# Patient Record
Sex: Female | Born: 1945 | Race: White
Health system: Northeastern US, Academic
[De-identification: ages and names within clinical notes are randomized; demographics above are authoritative.]

## PROBLEM LIST (undated history)

## (undated) DIAGNOSIS — D249 Benign neoplasm of unspecified breast: Secondary | ICD-10-CM

## (undated) DIAGNOSIS — I739 Peripheral vascular disease, unspecified: Secondary | ICD-10-CM

## (undated) DIAGNOSIS — G20 Parkinson's disease: Secondary | ICD-10-CM

## (undated) DIAGNOSIS — F1011 Alcohol abuse, in remission: Secondary | ICD-10-CM

## (undated) DIAGNOSIS — Z95810 Presence of automatic (implantable) cardiac defibrillator: Secondary | ICD-10-CM

## (undated) DIAGNOSIS — I2510 Atherosclerotic heart disease of native coronary artery without angina pectoris: Secondary | ICD-10-CM

## (undated) HISTORY — DX: Atherosclerotic heart disease of native coronary artery without angina pectoris: I25.10

## (undated) HISTORY — DX: Parkinson's disease: G20

## (undated) HISTORY — DX: Presence of automatic (implantable) cardiac defibrillator: Z95.810

## (undated) HISTORY — DX: Peripheral vascular disease, unspecified: I73.9

## (undated) HISTORY — DX: Benign neoplasm of unspecified breast: D24.9

## (undated) HISTORY — DX: Alcohol abuse, in remission: F10.11

---

## 2011-07-14 DIAGNOSIS — I219 Acute myocardial infarction, unspecified: Secondary | ICD-10-CM

## 2011-07-14 HISTORY — DX: Acute myocardial infarction, unspecified: I21.9

## 2011-08-06 ENCOUNTER — Ambulatory Visit
Admit: 2011-08-06 | Discharge: 2011-08-06 | Disposition: A | Discharge status: Home / Self Care | Payer: Self-pay | Source: Ambulatory Visit | Attending: Geriatric Medicine | Admitting: Geriatric Medicine

## 2011-08-06 LAB — BASIC METABOLIC PANEL
Anion Gap: 5 — ABNORMAL LOW (ref 7–16)
CO2: 32 mmol/L — ABNORMAL HIGH (ref 20–28)
Calcium: 9 mg/dL (ref 8.6–10.2)
Chloride: 103 mmol/L (ref 96–108)
Creatinine: 0.64 mg/dL (ref 0.51–0.95)
GFR,Black: 107 *
GFR,Caucasian: 93 *
Glucose: 88 mg/dL (ref 60–99)
Lab: 19 mg/dL (ref 6–20)
Potassium: 4.7 mmol/L (ref 3.3–5.1)
Sodium: 140 mmol/L (ref 133–145)

## 2011-08-06 LAB — CBC AND DIFFERENTIAL
Baso # K/uL: 0 10*3/uL (ref 0.0–0.1)
Basophil %: 0.4 % (ref 0.1–1.2)
Eos # K/uL: 0.1 10*3/uL (ref 0.0–0.4)
Eosinophil %: 1.6 % (ref 0.7–5.8)
Hematocrit: 38 % (ref 34–45)
Hemoglobin: 11.9 g/dL (ref 11.2–15.7)
Lymph # K/uL: 2.5 10*3/uL (ref 1.2–3.7)
Lymphocyte %: 33.4 % (ref 19.3–51.7)
MCV: 94 fL (ref 79–95)
Mono # K/uL: 0.7 10*3/uL (ref 0.2–0.9)
Monocyte %: 9.9 % (ref 4.7–12.5)
Neut # K/uL: 4 10*3/uL (ref 1.6–6.1)
Platelets: 227 10*3/uL (ref 160–370)
RBC: 4.1 MIL/uL (ref 3.9–5.2)
RDW: 17.3 % — ABNORMAL HIGH (ref 11.7–14.4)
Seg Neut %: 54.7 % (ref 34.0–71.1)
WBC: 7.4 10*3/uL (ref 4.0–10.0)

## 2011-09-28 ENCOUNTER — Ambulatory Visit
Admit: 2011-09-28 | Discharge: 2011-09-28 | Disposition: A | Discharge status: Home / Self Care | Payer: Self-pay | Source: Ambulatory Visit | Attending: Geriatric Medicine | Admitting: Geriatric Medicine

## 2011-09-28 LAB — T4, FREE: Free T4: 1.1 ng/dL (ref 0.9–1.7)

## 2011-09-28 LAB — TSH: TSH: 1.31 u[IU]/mL (ref 0.27–4.20)

## 2011-10-23 ENCOUNTER — Encounter: Payer: Self-pay | Admitting: Physician Assistant

## 2011-10-23 DIAGNOSIS — I639 Cerebral infarction, unspecified: Secondary | ICD-10-CM | POA: Insufficient documentation

## 2011-10-23 DIAGNOSIS — I2510 Atherosclerotic heart disease of native coronary artery without angina pectoris: Secondary | ICD-10-CM | POA: Insufficient documentation

## 2011-10-23 DIAGNOSIS — F1010 Alcohol abuse, uncomplicated: Secondary | ICD-10-CM | POA: Insufficient documentation

## 2011-10-23 DIAGNOSIS — J449 Chronic obstructive pulmonary disease, unspecified: Secondary | ICD-10-CM | POA: Insufficient documentation

## 2011-10-23 DIAGNOSIS — I509 Heart failure, unspecified: Secondary | ICD-10-CM | POA: Insufficient documentation

## 2011-10-23 DIAGNOSIS — I472 Ventricular tachycardia: Secondary | ICD-10-CM | POA: Insufficient documentation

## 2011-10-23 DIAGNOSIS — I10 Essential (primary) hypertension: Secondary | ICD-10-CM

## 2011-10-23 DIAGNOSIS — D638 Anemia in other chronic diseases classified elsewhere: Secondary | ICD-10-CM | POA: Insufficient documentation

## 2011-10-23 DIAGNOSIS — I340 Nonrheumatic mitral (valve) insufficiency: Secondary | ICD-10-CM | POA: Insufficient documentation

## 2011-10-23 HISTORY — DX: Essential (primary) hypertension: I10

## 2011-10-23 HISTORY — DX: Heart failure, unspecified: I50.9

## 2011-10-23 HISTORY — DX: Ventricular tachycardia: I47.2

## 2011-10-23 HISTORY — DX: Anemia in other chronic diseases classified elsewhere: D63.8

## 2011-10-23 HISTORY — DX: Chronic obstructive pulmonary disease, unspecified: J44.9

## 2011-10-23 HISTORY — DX: Cerebral infarction, unspecified: I63.9

## 2011-10-23 HISTORY — DX: Nonrheumatic mitral (valve) insufficiency: I34.0

## 2011-10-23 HISTORY — DX: Atherosclerotic heart disease of native coronary artery without angina pectoris: I25.10

## 2011-10-25 ENCOUNTER — Non-Acute Institutional Stay: Payer: Self-pay | Admitting: Physician Assistant

## 2011-10-25 ENCOUNTER — Encounter: Payer: Self-pay | Admitting: Physician Assistant

## 2011-10-25 NOTE — Progress Notes (Signed)
Geriatrics: Patient seen 10/24/11 at 10:05 am.  [facility] Chronic Note    Patient Name: Michelle [name]   Patient DOB: [birth year]   Patient MR#: [id number]   Facility: [facility]   Unit:        Reason for Visit  Michelle [name] was seen today for follow-up of chronic conditions.    Interval History: Since her last this patient has required any acute medical interventions.  She states that she has been feeling well.  Last night however, patient did complain of a dry nonproductive cough per nursing her lungs were clear and she did request medication for the cough.  She states that the Robitussin did work quite well.  She has no longer having any cough this morning.  She denies any fevers, chills, nausea, vomiting, shortness of breath, chest pain, sputum production, or any other issues.  She also states that she has been eating and drinking well and moving her bowels regularly.  She denies any trouble with urination including dysuria or pressure.  She continues to work with physical therapy.    Past Medical History:    Medical History:  Past Medical History   Diagnosis Date   . CVA (cerebral infarction) 10/23/2011   . ASHD (arteriosclerotic heart disease) 10/23/2011   . MI (mitral incompetence) 10/23/2011   . CHF (congestive heart failure) 10/23/2011   . COPD (chronic obstructive pulmonary disease) 10/23/2011   . ETOH abuse 10/23/2011   . VT (ventricular tachycardia) 10/23/2011     S/p AICD   . Anemia of chronic disease 10/23/2011   . HTN (hypertension) 10/23/2011       Surgical History  Past Surgical History   Procedure Laterality Date   . Icd     . Hemicolectomy Left         Review of Systems:  Review of Systems  Please see above.  In addition patient denies any weight changes, abdominal pain, diarrhea, dizziness, weakness, fatigue, or any other issues.  The remainder of the review of systems was negative.    Physical Examination:  Filed Vitals:    10/24/11 0600   Temp: 35.9 C (96.7 F)   SpO2: 92%       Physical Exam   Vital signs see  above.  Gen: Patient is in no acute distress, lying in bed comfortably, pleasant, alert and oriented x3 .  Head: Normocephalic and atraumatic.  There is no tenderness over the face sinuses her scalp.  Eyes: Anicteric, there is no conjunctival irritation or erythema.  There is no drainage or discharge noted from either eye.  Mouth: Oral mucosa is moist, there are no lesions present.  Neck: Supple, no cervical lymphadenopathy, trachea midline  Chest: Chest wall is nontender.  Lungs: Clear to auscultation bilaterally.  No wheezes rales or rhonchi.  Heart: Regular rate and rhythm S1-S2 there is no murmur rub or gallop.  Abdomen nontender nondistended, positive bowel sounds throughout, no suprapubic tenderness.  Extremities: Able to move all 4 extremities, no rigidity.  She does have significant resting tremor of her right arm with ataxia of both her right arm and right leg.  There is no edema present no calf tenderness, no joint tenderness.    Allergies  No Known Allergies (drug, envir, food or latex)     Medications:  Current Outpatient Rx   Name  Route  Sig  Dispense  Refill   . potassium chloride (K-TABS,KLOR-CON) 10 MEQ CR tablet    Oral    Take 10 mEq  by mouth every other day             . amiodarone (PACERONE) 400 MG tablet    Oral    Take 400 mg by mouth daily             . aspirin 81 MG EC tablet    Oral    Take 81 mg by mouth daily             . atorvastatin (LIPITOR) 10 MG tablet    Oral    Take 10 mg by mouth daily (with dinner)             . lactulose (CHRONULAC) 20 GM/30ML solution    Oral    Take 10 g by mouth daily             . famotidine (PEPCID) 20 MG tablet    Oral    Take 20 mg by mouth daily             . furosemide (LASIX) 20 MG tablet    Oral    Take 20 mg by mouth every morning             . lisinopril (PRINIVIL,ZESTRIL) 2.5 MG tablet    Oral    Take 2.5 mg by mouth daily             . sertraline (ZOLOFT) 50 MG tablet    Oral    Take 50 mg by mouth daily             . cyanocobalamin (VITAMIN  B-12) 1000 MCG tablet    Oral    Take 1,000 mcg by mouth daily             . calcium carbonate-vitamin D 600-400 MG-UNIT per tablet    Oral    Take 1 tablet by mouth 2 times daily             . carvedilol (COREG) 12.5 MG tablet    Oral    Take 12.5 mg by mouth 2 times daily             . bisacodyl (DULCOLAX) 10 MG suppository    Rectal    Place 10 mg rectally daily as needed             . Acetaminophen (APAP) 325 MG tablet    Oral    Take 650 mg by mouth every 6 hours as needed             . sorbitol 70 % solution    Oral    Take 30 mLs by mouth daily as needed                 Psychotropic Medications    Yes. On zoloft for depression. Tolerating well. No Plan to change.    Pain Management   N/A    Latest Laboratory Results  Lab Results   Component Value Date    WBC 7.4 08/06/2011    HGB 11.9 08/06/2011    HCT 38 08/06/2011    MCV 94 08/06/2011    PLT 227 08/06/2011     Sodium   Date Value Range Status   08/06/2011 140  133 - 145 mmol/L Final        Potassium   Date Value Range Status   08/06/2011 4.7  3.3 - 5.1 mmol/L Final        Chloride   Date Value Range Status  08/06/2011 103  96 - 108 mmol/L Final        CO2   Date Value Range Status   08/06/2011 32* 20 - 28 mmol/L Final        Glucose   Date Value Range Status   08/06/2011 88  60 - 99 mg/dL Final      Reference Ranges apply only to FASTING samples.            ADA Guidelines Blood Sugar Levels for Diagnosing Diabetes & Pre-diabetes      Normal: < 100 mg/dL      Impaired Fasting Glucose (IFG): 100-125 mg/dL      Diabetes:  > 161 mg/dL on two different occasions        UN   Date Value Range Status   08/06/2011 19  6 - 20 mg/dL Final        Calcium   Date Value Range Status   08/06/2011 9.0  8.6 - 10.2 mg/dL Final        Creatinine   Date Value Range Status   08/06/2011 0.64  0.51 - 0.95 mg/dL Final       Calcium   Date Value Range Status   08/06/2011 9.0  8.6 - 10.2 mg/dL Final        Chloride   Date Value Range Status   08/06/2011 103  96 - 108 mmol/L Final        CO2   Date  Value Range Status   08/06/2011 32* 20 - 28 mmol/L Final        Creatinine   Date Value Range Status   08/06/2011 0.64  0.51 - 0.95 mg/dL Final        Glucose   Date Value Range Status   08/06/2011 88  60 - 99 mg/dL Final      Reference Ranges apply only to FASTING samples.            ADA Guidelines Blood Sugar Levels for Diagnosing Diabetes & Pre-diabetes      Normal: < 100 mg/dL      Impaired Fasting Glucose (IFG): 100-125 mg/dL      Diabetes:  > 096 mg/dL on two different occasions        Potassium   Date Value Range Status   08/06/2011 4.7  3.3 - 5.1 mmol/L Final        Sodium   Date Value Range Status   08/06/2011 140  133 - 145 mmol/L Final        UN   Date Value Range Status   08/06/2011 19  6 - 20 mg/dL Final           Goals of Care: The goals of care are: Function    Advanced Directives:     CPR Order: Attempt Cardio-Pulmonary Resuscitation      Assessment/Plan:  1.  Cough: The patient has responded well to Robitussin.  We will continue to monitor vital signs and encourage fluids.  Patient has been afebrile and currently has no further complaints.  Please notify provider if any further issues and continue when necessary Robitussin.  2.  Coronary artery disease with congestive heart failure: Patient has been doing well on her current regimen of lisinopril, carvedilol, and furosemide.  Last electrolytes were acceptable.  She also remains on aspirin 81 mg and is tolerating this well.   3.  GERD: Patient remains on famotidine and this is taking care of all of her symptoms.  We  will continue with that.  4.  History of CVA: Patient continues to have severe ataxia of her right arm with noted tremor.  She continues to participate in PT and OT and this has been continued.  5 Anemia: She did have a blood transfusion in the hospital her hematocrit was 38.  Iron supplementation was discontinued.  We will recheck a CBC in a couple of weeks to see how she is doing.    Follow-up:  Patient will be seen in about 30 days for  ongoing followup.      Provider Signature:   Suzan Nailer, PA     Date: 10/25/2011 Time:   2:16 PM

## 2011-11-06 ENCOUNTER — Ambulatory Visit
Admit: 2011-11-06 | Discharge: 2011-11-06 | Disposition: A | Discharge status: Home / Self Care | Payer: Self-pay | Source: Ambulatory Visit | Attending: Geriatric Medicine | Admitting: Geriatric Medicine

## 2011-11-07 LAB — INFLUENZA  A & B/RSV PCR: Influenza A&B/RSV PCR: 0

## 2011-11-09 ENCOUNTER — Ambulatory Visit
Admit: 2011-11-09 | Discharge: 2011-11-09 | Disposition: A | Discharge status: Home / Self Care | Payer: Self-pay | Source: Ambulatory Visit | Attending: Physician Assistant | Admitting: Physician Assistant

## 2011-11-09 LAB — CBC AND DIFFERENTIAL
Baso # K/uL: 0 10*3/uL (ref 0.0–0.1)
Basophil %: 0 % (ref 0.1–1.2)
Eos # K/uL: 0.1 10*3/uL (ref 0.0–0.4)
Eosinophil %: 0.9 % (ref 0.7–5.8)
Hematocrit: 36 % (ref 34–45)
Hemoglobin: 11.3 g/dL (ref 11.2–15.7)
Lymph # K/uL: 3.1 10*3/uL (ref 1.2–3.7)
Lymphocyte %: 28.4 % (ref 19.3–51.7)
MCV: 88 fL (ref 79–95)
Mono # K/uL: 0.6 10*3/uL (ref 0.2–0.9)
Monocyte %: 6 % (ref 4.7–12.5)
Neut # K/uL: 5.9 10*3/uL (ref 1.6–6.1)
RBC: 4 MIL/uL (ref 3.9–5.2)
RDW: 17.4 % — ABNORMAL HIGH (ref 11.7–14.4)
Seg Neut %: 60.3 % (ref 34.0–71.1)
WBC: 9.7 10*3/uL (ref 4.0–10.0)

## 2011-11-09 LAB — MISC. CELL %: Misc. Cell %: 0 % (ref 0–0)

## 2011-11-09 LAB — REACTIVE LYMPHS: React Lymph %: 3 % (ref 0–6)

## 2011-11-09 LAB — GIANT PLATELETS

## 2011-11-09 LAB — METAMYELOCYTE: Metamyelocyte %: 1 % (ref 0–1)

## 2011-11-09 LAB — MANUAL DIFFERENTIAL

## 2011-11-21 LAB — VIRUS CULTURE: Virus Culture: 0

## 2011-12-18 ENCOUNTER — Encounter: Payer: Self-pay | Admitting: Geriatric Medicine

## 2011-12-18 ENCOUNTER — Non-Acute Institutional Stay: Payer: Self-pay | Admitting: Geriatric Medicine

## 2011-12-18 DIAGNOSIS — I2510 Atherosclerotic heart disease of native coronary artery without angina pectoris: Secondary | ICD-10-CM

## 2011-12-18 NOTE — Progress Notes (Signed)
Geriatrics  [facility] Chronic Note    Patient Name: Michelle [name]   Patient DOB: [birth year]   Patient MR#: [id number]   Facility: [facility]   Unit:        Reason for Visit  Michelle [name] was seen today for follow-up of chronic conditions.    Interval History:  The patient says she is feeling fairly well at this time.  She denies shortness of breath or swelling of her feet or legs.  She denies any chest pain or palpitations.  She denies lightheadedness.  She is ambulating with a walker and one assist.    Past Medical History:    Medical History:  Past Medical History   Diagnosis Date   . CVA (cerebral infarction) 10/23/2011   . ASHD (arteriosclerotic heart disease) 10/23/2011   . MI (mitral incompetence) 10/23/2011   . CHF (congestive heart failure) 10/23/2011   . COPD (chronic obstructive pulmonary disease) 10/23/2011   . ETOH abuse 10/23/2011   . VT (ventricular tachycardia) 10/23/2011     S/p AICD   . Anemia of chronic disease 10/23/2011   . HTN (hypertension) 10/23/2011       Surgical History  Past Surgical History   Procedure Laterality Date   . Icd     . Hemicolectomy Left         Review of Systems: Gastrointestinal she denies any constipation or diarrhea or heartburn or difficulty swallowing food.  Skin she denies any rash or open areas or itching.  Review of Systems respiratory: She denies cough she denies shortness of breath with light exertion.    Physical Examination:  Filed Vitals:    12/18/11 1414   BP: 102/60   Pulse: 62   Temp: 36.2 C (97.1 F)   Resp: 18   Weight: 53.524 kg (118 lb)       Physical Exam On physical exam, the patient is sitting up in no acute distress.  Patient is alert and oriented x3.  Patient moves all extremities normally except for a left foot drop.  There is no focal weakness no tremor and no rigidity.  The head is atraumatic normocephalic.  There is no tenderness over the face sinuses or scalp.  The eyes show no scleral icterus, no conjunctival injection, no drainage from either eye.  The mouth  reveals moist mucosa, the pharynx is noninjected and there are no exudates.  Exam of the neck reveals no JVD, no neck masses, no thyromegaly and the trachea is midline.  Exam of the chest reveals no chest wall tenderness there is an AICD palpable in the left anterior chest wall, lungs are clear. Cardiac rhythm is regular at 62 per minute.  There is no murmur gallop or rub.  S1 and S2 are normal. Abdominal exam reveals no tenderness, no guarding, no mass, no organomegaly.  Bowel sounds are normal, there is no CVA tenderness.  Extremities revealed no edema, no calf tenderness.  There is no joint tenderness. The skin exam reveals no open areas and no rash.      Allergies  No Known Allergies (drug, envir, food or latex)     Medications:  Current Outpatient Prescriptions   Medication Sig   . amiodarone (PACERONE) 400 MG tablet Take 400 mg by mouth daily   . aspirin 81 MG EC tablet Take 81 mg by mouth daily   . atorvastatin (LIPITOR) 10 MG tablet Take 10 mg by mouth daily (with dinner)   . lactulose (CHRONULAC) 20 GM/30ML solution Take 10  g by mouth daily   . famotidine (PEPCID) 20 MG tablet Take 20 mg by mouth daily   . sertraline (ZOLOFT) 50 MG tablet Take 50 mg by mouth daily   . cyanocobalamin (VITAMIN B-12) 1000 MCG tablet Take 1,000 mcg by mouth daily   . calcium carbonate-vitamin D 600-400 MG-UNIT per tablet Take 1 tablet by mouth 2 times daily   . carvedilol (COREG) 12.5 MG tablet Take 12.5 mg by mouth 2 times daily   . bisacodyl (DULCOLAX) 10 MG suppository Place 10 mg rectally daily as needed   . Acetaminophen (APAP) 325 MG tablet Take 650 mg by mouth every 6 hours as needed   . sorbitol 70 % solution Take 30 mLs by mouth daily as needed     No current facility-administered medications for this visit.       Psychotropic Medications Zoloft 50 mg a day for anxiety and depression.  She is responding well to this and this will be continued.      Pain Management Tylenol 650 mg 4 times a day as needed.    Latest  Laboratory Results  Lab Results   Component Value Date    NA 140 08/06/2011    K 4.7 08/06/2011    CL 103 08/06/2011    CO2 32* 08/06/2011    UN 19 08/06/2011    CREAT 0.64 08/06/2011    WBC 9.7 11/09/2011    HGB 11.3 11/09/2011    HCT 36 11/09/2011    PLT CANCELED 11/09/2011    TSH 1.31 09/28/2011       Goals of Care: The goals of care are: Longevity    Advanced Directives:     No limitations on medical interventions      Assessment/Plan: 1.  Coronary artery disease with congestive heart failure.  Her blood pressure has been quite low and at this point she does not appear to require a diuretic so I will discontinue that along with the potassium supplement and will be checking an SMA-18 a couple of weeks.  I will also take her off the low dose of lisinopril because of a low blood pressure.  We will continue with amiodarone because of the arrhythmias in the past we will continue with aspirin 81 mg a day.  I remains on a low dose of carvedilol 6.25 mg twice a day and that will be continued.  2.  COPD.  This is well compensated at present she has when necessary guaifenesin ordered but does not require any inhalers at this time.      Follow-up: 2 months        Provider Signature:   Fernand Parkins, MD     Date: 12/18/2011 Time:   2:21 PM

## 2011-12-31 ENCOUNTER — Ambulatory Visit
Admit: 2011-12-31 | Discharge: 2011-12-31 | Disposition: A | Discharge status: Home / Self Care | Payer: Self-pay | Source: Ambulatory Visit | Attending: Geriatric Medicine | Admitting: Geriatric Medicine

## 2011-12-31 LAB — BASIC METABOLIC PANEL
Anion Gap: 10 (ref 7–16)
CO2: 27 mmol/L (ref 20–28)
Calcium: 8.9 mg/dL (ref 8.6–10.2)
Chloride: 104 mmol/L (ref 96–108)
Creatinine: 0.73 mg/dL (ref 0.51–0.95)
GFR,Black: 99 *
GFR,Caucasian: 86 *
Glucose: 94 mg/dL (ref 60–99)
Lab: 29 mg/dL — ABNORMAL HIGH (ref 6–20)
Potassium: 4.2 mmol/L (ref 3.3–5.1)
Sodium: 141 mmol/L (ref 133–145)

## 2012-01-07 ENCOUNTER — Ambulatory Visit
Admit: 2012-01-07 | Discharge: 2012-01-07 | Disposition: A | Discharge status: Home / Self Care | Payer: Self-pay | Source: Ambulatory Visit | Attending: Geriatric Medicine | Admitting: Geriatric Medicine

## 2012-01-07 LAB — LIPID PANEL
Chol/HDL Ratio: 4.7
Cholesterol: 196 mg/dL
HDL: 42 mg/dL
LDL Calculated: 103 mg/dL
Non HDL Cholesterol: 154 mg/dL
Triglycerides: 255 mg/dL — AB

## 2012-01-07 LAB — HEPATIC FUNCTION PANEL
ALT: 56 U/L — ABNORMAL HIGH (ref 0–35)
AST: 43 U/L — ABNORMAL HIGH (ref 0–35)
Albumin: 3.9 g/dL (ref 3.5–5.2)
Alk Phos: 82 U/L (ref 35–105)
Bilirubin,Direct: <0.2 mg/dL (ref 0.0–0.3)
Bilirubin,Total: 0.3 mg/dL (ref 0.0–1.2)
Total Protein: 7.2 g/dL (ref 6.3–7.7)

## 2012-01-09 LAB — VITAMIN D
25-OH VIT D2: <4 ng/mL
25-OH VIT D3: 30 ng/mL
25-OH Vit Total: 30 ng/mL (ref 30–60)

## 2012-04-14 ENCOUNTER — Ambulatory Visit
Admit: 2012-04-14 | Discharge: 2012-04-14 | Disposition: A | Discharge status: Home / Self Care | Payer: Self-pay | Source: Ambulatory Visit | Attending: Geriatric Medicine | Admitting: Geriatric Medicine

## 2012-04-14 LAB — COMPREHENSIVE METABOLIC PANEL
ALT: 39 U/L — ABNORMAL HIGH (ref 0–35)
AST: 42 U/L — ABNORMAL HIGH (ref 0–35)
Albumin: 3.9 g/dL (ref 3.5–5.2)
Alk Phos: 101 U/L (ref 35–105)
Anion Gap: 11 (ref 7–16)
Bilirubin,Total: 0.3 mg/dL (ref 0.0–1.2)
CO2: 29 mmol/L — ABNORMAL HIGH (ref 20–28)
Calcium: 9.2 mg/dL (ref 8.6–10.2)
Chloride: 103 mmol/L (ref 96–108)
Creatinine: 0.92 mg/dL (ref 0.51–0.95)
GFR,Black: 74 *
GFR,Caucasian: 65 *
Glucose: 99 mg/dL (ref 60–99)
Lab: 22 mg/dL — ABNORMAL HIGH (ref 6–20)
Potassium: 4.1 mmol/L (ref 3.3–5.1)
Sodium: 143 mmol/L (ref 133–145)
Total Protein: 7 g/dL (ref 6.3–7.7)

## 2012-04-14 LAB — CBC
Hematocrit: 40 % (ref 34–45)
Hemoglobin: 12.5 g/dL (ref 11.2–15.7)
MCV: 90 fL (ref 79–95)
Platelets: 146 10*3/uL — ABNORMAL LOW (ref 160–370)
RBC: 4.4 MIL/uL (ref 3.9–5.2)
RDW: 15 % — ABNORMAL HIGH (ref 11.7–14.4)
WBC: 7.8 10*3/uL (ref 4.0–10.0)

## 2012-04-14 LAB — LIPID PANEL
Chol/HDL Ratio: 3.9
Cholesterol: 144 mg/dL
HDL: 37 mg/dL
LDL Calculated: 58 mg/dL
Non HDL Cholesterol: 107 mg/dL
Triglycerides: 246 mg/dL — AB

## 2012-04-23 ENCOUNTER — Ambulatory Visit
Admit: 2012-04-23 | Discharge: 2012-04-23 | Disposition: A | Discharge status: Home / Self Care | Payer: Self-pay | Source: Ambulatory Visit | Attending: Geriatric Medicine | Admitting: Geriatric Medicine

## 2012-04-23 LAB — LIPID PANEL
Chol/HDL Ratio: 3.8
Cholesterol: 150 mg/dL
HDL: 40 mg/dL
LDL Calculated: 78 mg/dL
Non HDL Cholesterol: 110 mg/dL
Triglycerides: 161 mg/dL — AB

## 2012-04-23 LAB — COMPREHENSIVE METABOLIC PANEL
ALT: 62 U/L — ABNORMAL HIGH (ref 0–35)
AST: 50 U/L — ABNORMAL HIGH (ref 0–35)
Albumin: 3.9 g/dL (ref 3.5–5.2)
Alk Phos: 102 U/L (ref 35–105)
Anion Gap: 12 (ref 7–16)
Bilirubin,Total: 0.3 mg/dL (ref 0.0–1.2)
CO2: 27 mmol/L (ref 20–28)
Calcium: 9.3 mg/dL (ref 8.6–10.2)
Chloride: 102 mmol/L (ref 96–108)
Creatinine: 0.84 mg/dL (ref 0.51–0.95)
GFR,Black: 83 *
GFR,Caucasian: 72 *
Glucose: 118 mg/dL — ABNORMAL HIGH (ref 60–99)
Lab: 20 mg/dL (ref 6–20)
Potassium: 4.7 mmol/L (ref 3.3–5.1)
Sodium: 141 mmol/L (ref 133–145)
Total Protein: 6.9 g/dL (ref 6.3–7.7)

## 2012-04-23 LAB — CBC AND DIFFERENTIAL
Baso # K/uL: 0 10*3/uL (ref 0.0–0.1)
Basophil %: 0.2 % (ref 0.1–1.2)
Eos # K/uL: 0.1 10*3/uL (ref 0.0–0.4)
Eosinophil %: 1.3 % (ref 0.7–5.8)
Hematocrit: 38 % (ref 34–45)
Hemoglobin: 12.2 g/dL (ref 11.2–15.7)
Lymph # K/uL: 1.8 10*3/uL (ref 1.2–3.7)
Lymphocyte %: 29.3 % (ref 19.3–51.7)
MCV: 90 fL (ref 79–95)
Mono # K/uL: 0.9 10*3/uL (ref 0.2–0.9)
Monocyte %: 14.5 % — ABNORMAL HIGH (ref 4.7–12.5)
Neut # K/uL: 3.3 10*3/uL (ref 1.6–6.1)
Platelets: 116 10*3/uL — ABNORMAL LOW (ref 160–370)
RBC: 4.3 MIL/uL (ref 3.9–5.2)
RDW: 15.7 % — ABNORMAL HIGH (ref 11.7–14.4)
Seg Neut %: 54.7 % (ref 34.0–71.1)
WBC: 6.1 10*3/uL (ref 4.0–10.0)

## 2012-05-01 ENCOUNTER — Non-Acute Institutional Stay: Payer: Self-pay | Admitting: Geriatric Medicine

## 2012-05-01 NOTE — Progress Notes (Signed)
Geriatrics  [facility] Chronic Note    Patient Name: Michelle [name]   Patient DOB: 12-05-1945   Patient MR#: [id number]   Facility: [facility]   Unit:        Reason for Visit  Michelle [name] was seen today for follow-up of chronic conditions.    Interval History:  The patient says she is feeling fairly well at this time.  She denies shortness of breath or swelling of her feet or legs.  She denies any chest pain or palpitations.  She denies lightheadedness.  She is ambulating with a walker and one assist.  She denies pain in her extremities or chest pain.  Past Medical History:    Medical History:  Past Medical History   Diagnosis Date   . CVA (cerebral infarction) 10/23/2011   . ASHD (arteriosclerotic heart disease) 10/23/2011   . MI (mitral incompetence) 10/23/2011   . CHF (congestive heart failure) 10/23/2011   . COPD (chronic obstructive pulmonary disease) 10/23/2011   . ETOH abuse 10/23/2011   . VT (ventricular tachycardia) 10/23/2011     S/p AICD   . Anemia of chronic disease 10/23/2011   . HTN (hypertension) 10/23/2011       Surgical History  Past Surgical History   Procedure Laterality Date   . Icd     . Hemicolectomy Left         Review of Systems: Gastrointestinal she denies any constipation or diarrhea or heartburn or difficulty swallowing food.  She also denies nausea vomiting or any abdominal pain. Skin she denies any rash or open areas or itching.  Review of Systems respiratory: She denies cough she denies shortness of breath with light exertion.      Physical Examination:  There were no vitals filed for this visit.    Physical Exam On physical exam, the patient is sitting up in no acute distress.  Patient is alert and oriented x3.  Patient moves all extremities normally except for a left foot drop.  There is no focal weakness no tremor and no rigidity.  The head is atraumatic normocephalic.  There is no tenderness over the face sinuses or scalp.  The eyes show no scleral icterus, no conjunctival injection, no drainage from  either eye.  The mouth reveals moist mucosa, the pharynx is noninjected and there are no exudates.  Exam of the neck reveals no JVD, no neck masses, no thyromegaly and the trachea is midline.  Exam of the chest reveals no chest wall tenderness there is an AICD palpable in the left anterior chest wall, lungs are clear. Cardiac rhythm is regular at 68 per minute.  There is no murmur gallop or rub.  S1 and S2 are normal. Abdominal exam reveals no tenderness, no guarding, no mass, no organomegaly.  Bowel sounds are normal, there is no CVA tenderness.  Extremities revealed no edema, no calf tenderness.  There is no joint tenderness. The skin exam reveals no open areas and no rash.      Allergies  No Known Allergies (drug, envir, food or latex)     Medications:  Current Outpatient Prescriptions   Medication Sig   . carvedilol (COREG) 6.25 MG tablet Take 6.25 mg by mouth 2 times daily (with meals)   . amiodarone (PACERONE) 400 MG tablet Take 400 mg by mouth daily   . aspirin 81 MG EC tablet Take 81 mg by mouth daily   . atorvastatin (LIPITOR) 10 MG tablet Take 10 mg by mouth daily (with dinner)   .  famotidine (PEPCID) 20 MG tablet Take 20 mg by mouth daily   . sertraline (ZOLOFT) 50 MG tablet Take 50 mg by mouth daily   . cyanocobalamin (VITAMIN B-12) 1000 MCG tablet Take 1,000 mcg by mouth daily   . calcium carbonate-vitamin D 600-400 MG-UNIT per tablet Take 1 tablet by mouth 2 times daily   . bisacodyl (DULCOLAX) 10 MG suppository Place 10 mg rectally daily as needed   . Acetaminophen (APAP) 325 MG tablet Take 650 mg by mouth every 6 hours as needed   . sorbitol 70 % solution Take 30 mLs by mouth daily as needed     No current facility-administered medications for this visit.       Psychotropic Medications Zoloft 50 mg a day for anxiety and depression.  She is responding well to this and this will be continued.      Pain Management Tylenol 650 mg 4 times a day as needed.    Latest Laboratory Results  Lab Results    Component Value Date    NA 141 04/23/2012    K 4.7 04/23/2012    CL 102 04/23/2012    CO2 27 04/23/2012    UN 20 04/23/2012    CREAT 0.84 04/23/2012    VID25 30 01/07/2012    WBC 6.1 04/23/2012    HGB 12.2 04/23/2012    HCT 38 04/23/2012    PLT 116* 04/23/2012    TSH 1.31 09/28/2011    CHOL 150 04/23/2012    TRIG 161* 04/23/2012    HDL 40 04/23/2012    LDLC 78 04/23/2012    CHHDC 3.8 04/23/2012       Goals of Care: The goals of care are: Longevity    Advanced Directives:     No limitations on medical interventions      Assessment/Plan: 1.  Coronary artery disease with congestive heart failure.  She is doing well without the diuretics or lisinopril.  We will continue with amiodarone because of the arrhythmias in the past we will continue with aspirin 81 mg a day.  She remains on a low dose of carvedilol 6.25 mg twice a day and that will be continued.  She does have a history of hyperlipidemia and is on Lipitor because of her coronary artery disease and she does have borderline elevation of her liver enzymes which we are monitoring.  They have remained in the same range but they're still slightly elevated this may be related to her past history of alcohol use and the levels of elevation are not high enough that we need to discontinue the Lipitor at this point but we will need to continue monitoring this if her liver enzymes continue to rise we may need to consider taking her off the Lipitor. 2.  COPD.  This is well compensated at present she has when necessary guaifenesin ordered but does not require any inhalers at this time.      Follow-up: 2 months        Provider Signature:   Fernand Parkins, MD     Date: 05/01/2012 Time:   11:22 AM

## 2012-05-26 ENCOUNTER — Ambulatory Visit
Admit: 2012-05-26 | Discharge: 2012-05-26 | Disposition: A | Discharge status: Home / Self Care | Payer: Self-pay | Source: Ambulatory Visit | Attending: Geriatric Medicine | Admitting: Geriatric Medicine

## 2012-05-26 LAB — COMPREHENSIVE METABOLIC PANEL
ALT: 65 U/L — ABNORMAL HIGH (ref 0–35)
AST: 64 U/L — ABNORMAL HIGH (ref 0–35)
Albumin: 3.9 g/dL (ref 3.5–5.2)
Alk Phos: 107 U/L — ABNORMAL HIGH (ref 35–105)
Anion Gap: 11 (ref 7–16)
Bilirubin,Total: 0.3 mg/dL (ref 0.0–1.2)
CO2: 29 mmol/L — ABNORMAL HIGH (ref 20–28)
Calcium: 9.3 mg/dL (ref 8.6–10.2)
Chloride: 103 mmol/L (ref 96–108)
Creatinine: 0.94 mg/dL (ref 0.51–0.95)
GFR,Black: 72 *
GFR,Caucasian: 63 *
Glucose: 122 mg/dL — ABNORMAL HIGH (ref 60–99)
Lab: 18 mg/dL (ref 6–20)
Potassium: 4.5 mmol/L (ref 3.3–5.1)
Sodium: 143 mmol/L (ref 133–145)
Total Protein: 7.1 g/dL (ref 6.3–7.7)

## 2012-06-26 ENCOUNTER — Encounter: Payer: Self-pay | Admitting: Geriatric Medicine

## 2012-06-26 ENCOUNTER — Non-Acute Institutional Stay: Payer: Self-pay | Admitting: Geriatric Medicine

## 2012-06-26 NOTE — Progress Notes (Unsigned)
Geriatrics  [facility] Chronic Note    Patient Name: Michelle [name]   Patient DOB: September 15, 1945   Patient MR#: [id number]   Facility: [facility]   Unit:        Reason for Visit  Michelle [name] was seen today for follow-up of chronic conditions.    Interval History:  The patient says she is feeling fairly well at this time.  She denies shortness of breath or swelling of her feet or legs.  She denies any chest pain or palpitations.  The staff has noted some decline in her functional ability and ADLs so she has been restarted recently and occupational therapy help improve her ability to function with ADLs. She is ambulating with a walker and one assist.    Past Medical Hist and ory:    Medical History:  Past Medical History   Diagnosis Date   . CVA (cerebral infarction) 10/23/2011   . ASHD (arteriosclerotic heart disease) 10/23/2011   . MI (mitral incompetence) 10/23/2011   . CHF (congestive heart failure) 10/23/2011   . COPD (chronic obstructive pulmonary disease) 10/23/2011   . ETOH abuse 10/23/2011   . VT (ventricular tachycardia) 10/23/2011     S/p AICD   . Anemia of chronic disease 10/23/2011   . HTN (hypertension) 10/23/2011   . Parkinsonism        Surgical History  Past Surgical History   Procedure Laterality Date   . Icd     . Hemicolectomy Left         Review of Systems: Gastrointestinal she denies any constipation or diarrhea or heartburn or difficulty swallowing food.  She also denies nausea vomiting or any abdominal pain. Skin she denies any rash or open areas or itching.  Review of Systems respiratory: She denies cough she denies shortness of breath with light exertion.      Physical Examination:  There were no vitals filed for this visit.    Physical Exam On physical exam, the patient is sitting up in no acute distress.  Patient is alert and oriented x3.  Patient moves all extremities normally except for a left foot drop.  There is no focal weakness a resting tremor of the right arm and right hand was present today with some  cogwheel rigidity of the right arm.  No tremor on the left and no rigidity on the left.  The head is atraumatic normocephalic.  There is no tenderness over the face sinuses or scalp.  The eyes show no scleral icterus, no conjunctival injection, no drainage from either eye.  The mouth reveals moist mucosa, the pharynx is noninjected and there are no exudates.  Exam of the neck reveals no JVD, no neck masses, no thyromegaly and the trachea is midline.  Exam of the chest reveals no chest wall tenderness there is an AICD palpable in the left anterior chest wall, lungs are clear. Cardiac rhythm is regular at 68 per minute.  There is no murmur gallop or rub.  S1 and S2 are normal. Abdominal exam reveals no tenderness, no guarding, no mass, no organomegaly.  Bowel sounds are normal, there is no CVA tenderness.  Extremities revealed no edema, no calf tenderness.  There is no joint tenderness. The skin exam reveals no open areas and no rash.      Allergies  No Known Allergies (drug, envir, food or latex)     Medications:  Current Outpatient Prescriptions   Medication Sig   . carbidopa-levodopa (SINEMET) 25-100 MG per tablet Take 1  tablet by mouth 2 times daily   . carvedilol (COREG) 6.25 MG tablet Take 6.25 mg by mouth 2 times daily (with meals)   . amiodarone (PACERONE) 400 MG tablet Take 400 mg by mouth daily   . aspirin 81 MG EC tablet Take 81 mg by mouth daily   . atorvastatin (LIPITOR) 10 MG tablet Take 10 mg by mouth daily (with dinner)   . famotidine (PEPCID) 20 MG tablet Take 20 mg by mouth daily   . sertraline (ZOLOFT) 50 MG tablet Take 50 mg by mouth daily   . cyanocobalamin (VITAMIN B-12) 1000 MCG tablet Take 1,000 mcg by mouth daily   . calcium carbonate-vitamin D 600-400 MG-UNIT per tablet Take 1 tablet by mouth 2 times daily   . bisacodyl (DULCOLAX) 10 MG suppository Place 10 mg rectally daily as needed   . Acetaminophen (APAP) 325 MG tablet Take 650 mg by mouth every 6 hours as needed   . sorbitol 70 %  solution Take 30 mLs by mouth daily as needed     No current facility-administered medications for this visit.       Psychotropic Medications Zoloft 50 mg a day for anxiety and depression.  She is responding well to this and this will be continued.      Pain Management Tylenol 650 mg 4 times a day as needed.    Latest Laboratory Results  Lab Results   Component Value Date    NA 143 05/26/2012    K 4.5 05/26/2012    CL 103 05/26/2012    CO2 29* 05/26/2012    UN 18 05/26/2012    CREAT 0.94 05/26/2012    VID25 30 01/07/2012    WBC 6.1 04/23/2012    HGB 12.2 04/23/2012    HCT 38 04/23/2012    PLT 116* 04/23/2012    TSH 1.31 09/28/2011    CHOL 150 04/23/2012    TRIG 161* 04/23/2012    HDL 40 04/23/2012    LDLC 78 04/23/2012    CHHDC 3.8 04/23/2012       Goals of Care: The goals of care are: Longevity    Advanced Directives:     No limitations on medical interventions      Assessment/Plan: 1.  Coronary artery disease with congestive heart failure.  She is doing well without the diuretics or lisinopril.  We will continue with amiodarone because of the arrhythmias in the past we will continue with aspirin 81 mg a day.  She remains on a low dose of carvedilol 6.25 mg twice a day and that will be continued.  She does have a history of hyperlipidemia and is on Lipitor because of her coronary artery disease and she does have borderline elevation of her liver enzymes which we are monitoring.  They have remained in the same range but they're still slightly elevated this may be related to her past history of alcohol use and the levels of elevation are not high enough that we need to discontinue the Lipitor at this point but we will need to continue monitoring this if her liver enzymes continue to rise we may need to consider taking her off the Lipitor we will continue to monitor her liver enzymes from time to time. 2.  COPD.  This is well compensated at present she has when necessary guaifenesin ordered but does not require any inhalers at this  time.  3.  Parkinsonism.  She has had a decline in her functional ability and is now  getting occupational therapy to help improve her function with ADLs.  I have noticed a definite resting tremor and some rigidity of her right arm this appears to be a parkinsonian type syndrome this may be affecting her bili to function.  This is likely due to atherosclerotic vascular disease and previous CVA since it seems to be quite localized to the right arm.  I will start her on a trial of Sinemet 25/100 twice a day and we will see how she tolerates that and we may be able to increase the dose further we'll continue with occupational therapy to help maximize her functional ability.      Follow-up: 2 months        Provider Signature:   Fernand Parkins, MD     Date: 06/26/2012 Time:   9:56 AM

## 2012-06-30 ENCOUNTER — Ambulatory Visit
Admit: 2012-06-30 | Discharge: 2012-06-30 | Disposition: A | Discharge status: Home / Self Care | Payer: Self-pay | Source: Ambulatory Visit | Attending: Geriatric Medicine | Admitting: Geriatric Medicine

## 2012-06-30 LAB — HEPATIC FUNCTION PANEL
ALT: 55 U/L — ABNORMAL HIGH (ref 0–35)
AST: 41 U/L — ABNORMAL HIGH (ref 0–35)
Albumin: 4 g/dL (ref 3.5–5.2)
Alk Phos: 120 U/L — ABNORMAL HIGH (ref 35–105)
Bilirubin,Direct: 0.3 mg/dL (ref 0.0–0.3)
Bilirubin,Total: 0.7 mg/dL (ref 0.0–1.2)
Total Protein: 7.6 g/dL (ref 6.3–7.7)

## 2012-06-30 LAB — URINE MICROSCOPIC (IQ200)
Hyaline Casts,UA: 3 /lpf — AB (ref 0–2)
RBC,UA: 1 /hpf (ref 0–2)
WBC,UA: 67 /hpf — AB (ref 0–5)

## 2012-06-30 LAB — URINALYSIS WITH REFLEX TO MICROSCOPIC
Blood,UA: NEGATIVE
Ketones, UA: NEGATIVE
Nitrite,UA: NEGATIVE
Protein,UA: 30 mg/dL — AB
Specific Gravity,UA: 1.035 — ABNORMAL HIGH (ref 1.002–1.030)
pH,UA: 6 (ref 5.0–8.0)

## 2012-06-30 LAB — HOLD LAVENDER

## 2012-06-30 LAB — HEPATITIS C ANTIBODY: Hep C Ab: NEGATIVE

## 2012-07-02 ENCOUNTER — Ambulatory Visit
Admit: 2012-07-02 | Discharge: 2012-07-02 | Disposition: A | Discharge status: Home / Self Care | Payer: Self-pay | Source: Ambulatory Visit | Attending: Geriatric Medicine | Admitting: Geriatric Medicine

## 2012-07-02 LAB — CBC AND DIFFERENTIAL
Baso # K/uL: 0 10*3/uL (ref 0.0–0.1)
Basophil %: 0.1 % (ref 0.1–1.2)
Eos # K/uL: 0 10*3/uL (ref 0.0–0.4)
Eosinophil %: 0.4 % — ABNORMAL LOW (ref 0.7–5.8)
Hematocrit: 38 % (ref 34–45)
Hemoglobin: 12.4 g/dL (ref 11.2–15.7)
Lymph # K/uL: 2 10*3/uL (ref 1.2–3.7)
Lymphocyte %: 19.7 % (ref 19.3–51.7)
MCV: 88 fL (ref 79–95)
Mono # K/uL: 1.2 10*3/uL — ABNORMAL HIGH (ref 0.2–0.9)
Monocyte %: 12 % (ref 4.7–12.5)
Neut # K/uL: 6.8 10*3/uL — ABNORMAL HIGH (ref 1.6–6.1)
Nucl RBC # K/uL: 0 10*3/uL
Nucl RBC %: 0 /100 WBC (ref 0.0–0.2)
Platelets: 184 10*3/uL (ref 160–370)
RBC: 4.3 MIL/uL (ref 3.9–5.2)
RDW: 15.2 % — ABNORMAL HIGH (ref 11.7–14.4)
Seg Neut %: 67.8 % (ref 34.0–71.1)
WBC: 10 10*3/uL (ref 4.0–10.0)

## 2012-07-02 LAB — HEPATIC FUNCTION PANEL
ALT: 33 U/L (ref 0–35)
AST: 44 U/L — ABNORMAL HIGH (ref 0–35)
Albumin: 3.8 g/dL (ref 3.5–5.2)
Alk Phos: 104 U/L (ref 35–105)
Bilirubin,Direct: 0.4 mg/dL — ABNORMAL HIGH (ref 0.0–0.3)
Bilirubin,Total: 0.7 mg/dL (ref 0.0–1.2)
Total Protein: 7.4 g/dL (ref 6.3–7.7)

## 2012-07-02 LAB — BASIC METABOLIC PANEL
Anion Gap: 17 — ABNORMAL HIGH (ref 7–16)
CO2: 25 mmol/L (ref 20–28)
Calcium: 8.9 mg/dL (ref 8.6–10.2)
Chloride: 90 mmol/L — ABNORMAL LOW (ref 96–108)
Creatinine: 1.14 mg/dL — ABNORMAL HIGH (ref 0.51–0.95)
GFR,Black: 57 * — AB
GFR,Caucasian: 50 * — AB
Lab: 60 mg/dL — ABNORMAL HIGH (ref 6–20)
Potassium: 3.9 mmol/L (ref 3.3–5.1)
Sodium: 132 mmol/L — ABNORMAL LOW (ref 133–145)

## 2012-07-02 LAB — AEROBIC CULTURE

## 2012-07-02 LAB — NT-PRO BNP: NT-pro BNP: 857 pg/mL (ref 0–900)

## 2012-07-02 LAB — GLUCOSE: Glucose: 156 mg/dL — ABNORMAL HIGH (ref 74–106)

## 2012-07-03 LAB — INFLUENZA  A & B/RSV PCR: Influenza A&B/RSV PCR: 0

## 2012-07-04 ENCOUNTER — Ambulatory Visit
Admit: 2012-07-04 | Discharge: 2012-07-04 | Disposition: A | Discharge status: Home / Self Care | Payer: Self-pay | Source: Ambulatory Visit | Attending: Geriatric Medicine | Admitting: Geriatric Medicine

## 2012-07-04 LAB — BASIC METABOLIC PANEL
Anion Gap: 11 (ref 7–16)
CO2: 30 mmol/L — ABNORMAL HIGH (ref 20–28)
Calcium: 9.2 mg/dL (ref 8.6–10.2)
Chloride: 96 mmol/L (ref 96–108)
Creatinine: 1.16 mg/dL — ABNORMAL HIGH (ref 0.51–0.95)
GFR,Black: 56 * — AB
GFR,Caucasian: 49 * — AB
Lab: 60 mg/dL — ABNORMAL HIGH (ref 6–20)
Potassium: 4.3 mmol/L (ref 3.3–5.1)
Sodium: 137 mmol/L (ref 133–145)

## 2012-07-04 LAB — HEPATIC FUNCTION PANEL
ALT: 32 U/L (ref 0–35)
AST: 54 U/L — ABNORMAL HIGH (ref 0–35)
Albumin: 3.7 g/dL (ref 3.5–5.2)
Alk Phos: 100 U/L (ref 35–105)
Bilirubin,Direct: 0.3 mg/dL (ref 0.0–0.3)
Bilirubin,Total: 0.5 mg/dL (ref 0.0–1.2)
Total Protein: 7.3 g/dL (ref 6.3–7.7)

## 2012-07-04 LAB — GIANT PLATELETS

## 2012-07-04 LAB — LEGIONELLA ANTIGEN, URINE: Legionella Antigen (Urine): 0

## 2012-07-04 LAB — GLUCOSE: Glucose: 115 mg/dL — ABNORMAL HIGH (ref 74–106)

## 2012-07-06 ENCOUNTER — Ambulatory Visit
Admit: 2012-07-06 | Discharge: 2012-07-06 | Disposition: A | Discharge status: Home / Self Care | Payer: Self-pay | Source: Ambulatory Visit | Attending: Geriatric Medicine | Admitting: Geriatric Medicine

## 2012-07-06 LAB — COMPREHENSIVE METABOLIC PANEL
ALT: 28 U/L (ref 0–35)
AST: 85 U/L — ABNORMAL HIGH (ref 0–35)
Albumin: 3.9 g/dL (ref 3.5–5.2)
Alk Phos: 111 U/L — ABNORMAL HIGH (ref 35–105)
Anion Gap: 13 (ref 7–16)
Bilirubin,Total: 0.6 mg/dL (ref 0.0–1.2)
CO2: 26 mmol/L (ref 20–28)
Calcium: 9.5 mg/dL (ref 8.6–10.2)
Chloride: 99 mmol/L (ref 96–108)
Creatinine: 0.99 mg/dL — ABNORMAL HIGH (ref 0.51–0.95)
GFR,Black: 68 *
GFR,Caucasian: 59 * — AB
Glucose: 109 mg/dL — ABNORMAL HIGH (ref 60–99)
Lab: 38 mg/dL — ABNORMAL HIGH (ref 6–20)
Potassium: 5 mmol/L (ref 3.3–5.1)
Sodium: 138 mmol/L (ref 133–145)
Total Protein: 7.8 g/dL — ABNORMAL HIGH (ref 6.3–7.7)

## 2012-07-06 LAB — DIFF BASED ON: Diff Based On: 116 CELLS

## 2012-07-06 LAB — CBC AND DIFFERENTIAL
Baso # K/uL: 0 10*3/uL (ref 0.0–0.1)
Basophil %: 0 % (ref 0.1–1.2)
Eos # K/uL: 0 10*3/uL (ref 0.0–0.4)
Eosinophil %: 0 % — ABNORMAL LOW (ref 0.7–5.8)
Hematocrit: 42 % (ref 34–45)
Hemoglobin: 13.7 g/dL (ref 11.2–15.7)
Lymph # K/uL: 2.4 10*3/uL (ref 1.2–3.7)
Lymphocyte %: 16.4 % — ABNORMAL LOW (ref 19.3–51.7)
MCV: 90 fL (ref 79–95)
Mono # K/uL: 0.5 10*3/uL (ref 0.2–0.9)
Monocyte %: 3.4 % — ABNORMAL LOW (ref 4.7–12.5)
Neut # K/uL: 11.3 10*3/uL — ABNORMAL HIGH (ref 1.6–6.1)
Platelets: 249 10*3/uL (ref 160–370)
RBC: 4.6 MIL/uL (ref 3.9–5.2)
RDW: 15.8 % — ABNORMAL HIGH (ref 11.7–14.4)
Seg Neut %: 74.1 % — ABNORMAL HIGH (ref 34.0–71.1)
WBC: 14.7 10*3/uL — ABNORMAL HIGH (ref 4.0–10.0)

## 2012-07-06 LAB — METAMYELOCYTE: Metamyelocyte %: 1 % (ref 0–1)

## 2012-07-06 LAB — MANUAL DIFFERENTIAL

## 2012-07-06 LAB — MYELOCYTE: Myelocyte %: 3 % — ABNORMAL HIGH (ref 0–0)

## 2012-07-06 LAB — LIPASE: Lipase: 39 U/L (ref 13–60)

## 2012-07-06 LAB — MISC. CELL %: Misc. Cell %: 0 % (ref 0–0)

## 2012-07-06 LAB — SMUDGE CELLS

## 2012-07-06 LAB — GIANT PLATELETS

## 2012-07-06 LAB — BANDS: Bands %: 3 % (ref 0–10)

## 2012-07-07 ENCOUNTER — Ambulatory Visit
Admit: 2012-07-07 | Discharge: 2012-07-07 | Disposition: A | Discharge status: Home / Self Care | Payer: Self-pay | Source: Ambulatory Visit | Attending: Geriatric Medicine | Admitting: Geriatric Medicine

## 2012-07-07 LAB — BASIC METABOLIC PANEL
Anion Gap: 12 (ref 7–16)
CO2: 27 mmol/L (ref 20–28)
Calcium: 9.8 mg/dL (ref 8.6–10.2)
Chloride: 100 mmol/L (ref 96–108)
Creatinine: 0.98 mg/dL — ABNORMAL HIGH (ref 0.51–0.95)
GFR,Black: 69 *
GFR,Caucasian: 60 *
Glucose: 112 mg/dL — ABNORMAL HIGH (ref 60–99)
Lab: 37 mg/dL — ABNORMAL HIGH (ref 6–20)
Potassium: 4.5 mmol/L (ref 3.3–5.1)
Sodium: 139 mmol/L (ref 133–145)

## 2012-07-07 LAB — CBC
Hematocrit: 42 % (ref 34–45)
Hemoglobin: 13.9 g/dL (ref 11.2–15.7)
MCV: 91 fL (ref 79–95)
Platelets: 261 10*3/uL (ref 160–370)
RBC: 4.7 MIL/uL (ref 3.9–5.2)
RDW: 15.9 % — ABNORMAL HIGH (ref 11.7–14.4)
WBC: 14.6 10*3/uL — ABNORMAL HIGH (ref 4.0–10.0)

## 2012-07-08 LAB — CBC AND DIFFERENTIAL
Baso # K/uL: 0 10*3/uL (ref 0.0–0.1)
Basophil %: 0 % (ref 0.1–1.2)
Eos # K/uL: 0.1 10*3/uL (ref 0.0–0.4)
Eosinophil %: 0.9 % (ref 0.7–5.8)
Hematocrit: 38 % (ref 34–45)
Hemoglobin: 12.7 g/dL (ref 11.2–15.7)
Lymph # K/uL: 3.4 10*3/uL (ref 1.2–3.7)
Lymphocyte %: 26.7 % (ref 19.3–51.7)
MCV: 88 fL (ref 79–95)
Mono # K/uL: 1 10*3/uL — ABNORMAL HIGH (ref 0.2–0.9)
Monocyte %: 8.6 % (ref 4.7–12.5)
Neut # K/uL: 7.2 10*3/uL — ABNORMAL HIGH (ref 1.6–6.1)
Nucl RBC # K/uL: 0 10*3/uL
Nucl RBC %: 0 /100 WBC (ref 0.0–0.2)
Platelets: 202 10*3/uL (ref 160–370)
RBC: 4.4 MIL/uL (ref 3.9–5.2)
RDW: 15.3 % — ABNORMAL HIGH (ref 11.7–14.4)
Seg Neut %: 59.5 % (ref 34.0–71.1)
WBC: 11.6 10*3/uL — ABNORMAL HIGH (ref 4.0–10.0)

## 2012-07-08 LAB — DIFF MANUAL
Bands %: 2 % (ref 0–10)
Blasts %: 1 % — ABNORMAL HIGH (ref 0–0)
Diff Based On: 116 CELLS
React Lymph %: 2 % (ref 0–6)

## 2012-07-08 LAB — HEMATOPATHOLOGY REVIEW

## 2012-07-09 ENCOUNTER — Ambulatory Visit
Admit: 2012-07-09 | Discharge: 2012-07-09 | Disposition: A | Discharge status: Home / Self Care | Payer: Self-pay | Source: Ambulatory Visit | Attending: Geriatric Medicine | Admitting: Geriatric Medicine

## 2012-07-09 LAB — COMPREHENSIVE METABOLIC PANEL
ALT: 61 U/L — ABNORMAL HIGH (ref 0–35)
AST: 89 U/L — ABNORMAL HIGH (ref 0–35)
Albumin: 3.8 g/dL (ref 3.5–5.2)
Alk Phos: 117 U/L — ABNORMAL HIGH (ref 35–105)
Anion Gap: 15 (ref 7–16)
Bilirubin,Total: 0.6 mg/dL (ref 0.0–1.2)
CO2: 24 mmol/L (ref 20–28)
Calcium: 9.3 mg/dL (ref 8.6–10.2)
Chloride: 97 mmol/L (ref 96–108)
Creatinine: 1.22 mg/dL — ABNORMAL HIGH (ref 0.51–0.95)
GFR,Black: 53 * — AB
GFR,Caucasian: 46 * — AB
Lab: 55 mg/dL — ABNORMAL HIGH (ref 6–20)
Potassium: 4.9 mmol/L (ref 3.3–5.1)
Sodium: 136 mmol/L (ref 133–145)
Total Protein: 7.7 g/dL (ref 6.3–7.7)

## 2012-07-09 LAB — CBC
Hematocrit: 42 % (ref 34–45)
Hemoglobin: 13.5 g/dL (ref 11.2–15.7)
MCV: 90 fL (ref 79–95)
Platelets: 276 10*3/uL (ref 160–370)
RBC: 4.7 MIL/uL (ref 3.9–5.2)
RDW: 16 % — ABNORMAL HIGH (ref 11.7–14.4)
WBC: 17 10*3/uL — ABNORMAL HIGH (ref 4.0–10.0)

## 2012-07-09 LAB — GLUCOSE: Glucose: 118 mg/dL — ABNORMAL HIGH (ref 74–106)

## 2012-07-11 ENCOUNTER — Ambulatory Visit
Admit: 2012-07-11 | Discharge: 2012-07-11 | Disposition: A | Discharge status: Home / Self Care | Payer: Self-pay | Source: Ambulatory Visit | Attending: Geriatric Medicine | Admitting: Geriatric Medicine

## 2012-07-11 LAB — CBC AND DIFFERENTIAL
Baso # K/uL: 0.2 10*3/uL — ABNORMAL HIGH (ref 0.0–0.1)
Basophil %: 0.9 % (ref 0.1–1.2)
Eos # K/uL: 0.2 10*3/uL (ref 0.0–0.4)
Eosinophil %: 0.9 % (ref 0.7–5.8)
Hematocrit: 42 % (ref 34–45)
Hemoglobin: 13.8 g/dL (ref 11.2–15.7)
Lymph # K/uL: 2.3 10*3/uL (ref 1.2–3.7)
Lymphocyte %: 11.4 % — ABNORMAL LOW (ref 19.3–51.7)
MCV: 88 fL (ref 79–95)
Mono # K/uL: 1.4 10*3/uL — ABNORMAL HIGH (ref 0.2–0.9)
Monocyte %: 7 % (ref 4.7–12.5)
Neut # K/uL: 15.7 10*3/uL — ABNORMAL HIGH (ref 1.6–6.1)
Nucl RBC # K/uL: 0 10*3/uL
Nucl RBC %: 0 /100 WBC (ref 0.0–0.2)
Platelets: 287 10*3/uL (ref 160–370)
RBC: 4.8 MIL/uL (ref 3.9–5.2)
RDW: 15.7 % — ABNORMAL HIGH (ref 11.7–14.4)
Seg Neut %: 78.9 % — ABNORMAL HIGH (ref 34.0–71.1)
WBC: 19.7 10*3/uL — ABNORMAL HIGH (ref 4.0–10.0)

## 2012-07-11 LAB — BANDS: Bands %: 1 % (ref 0–10)

## 2012-07-11 LAB — DIFF BASED ON: Diff Based On: 114 CELLS

## 2012-07-11 LAB — MANUAL DIFFERENTIAL

## 2012-07-11 LAB — MISC. CELL %: Misc. Cell %: 0 % (ref 0–0)

## 2012-07-11 LAB — SMUDGE CELLS

## 2012-07-11 LAB — GIANT PLATELETS

## 2012-07-14 ENCOUNTER — Ambulatory Visit
Admit: 2012-07-14 | Discharge: 2012-07-14 | Disposition: A | Discharge status: Home / Self Care | Payer: Self-pay | Source: Ambulatory Visit | Attending: Geriatric Medicine | Admitting: Geriatric Medicine

## 2012-07-14 LAB — CBC AND DIFFERENTIAL
Baso # K/uL: 0 10*3/uL (ref 0.0–0.1)
Basophil %: 0 % (ref 0.1–1.2)
Eos # K/uL: 0.1 10*3/uL (ref 0.0–0.4)
Eosinophil %: 0.9 % (ref 0.7–5.8)
Hematocrit: 41 % (ref 34–45)
Hemoglobin: 13.8 g/dL (ref 11.2–15.7)
Lymph # K/uL: 2.2 10*3/uL (ref 1.2–3.7)
Lymphocyte %: 13.8 % — ABNORMAL LOW (ref 19.3–51.7)
MCV: 87 fL (ref 79–95)
Mono # K/uL: 0.9 10*3/uL (ref 0.2–0.9)
Monocyte %: 6 % (ref 4.7–12.5)
Neut # K/uL: 12.2 10*3/uL — ABNORMAL HIGH (ref 1.6–6.1)
Nucl RBC # K/uL: 0 10*3/uL
Nucl RBC %: 0 /100 WBC (ref 0.0–0.2)
Platelets: 248 10*3/uL (ref 160–370)
RBC: 4.8 MIL/uL (ref 3.9–5.2)
RDW: 16.2 % — ABNORMAL HIGH (ref 11.7–14.4)
Seg Neut %: 78.4 % — ABNORMAL HIGH (ref 34.0–71.1)
WBC: 15.6 10*3/uL — ABNORMAL HIGH (ref 4.0–10.0)

## 2012-07-14 LAB — BASIC METABOLIC PANEL
Anion Gap: 13 (ref 7–16)
CO2: 23 mmol/L (ref 20–28)
Calcium: 8.6 mg/dL (ref 8.6–10.2)
Chloride: 100 mmol/L (ref 96–108)
Creatinine: 2.86 mg/dL — ABNORMAL HIGH (ref 0.51–0.95)
GFR,Black: 19 * — AB
GFR,Caucasian: 16 * — AB
Lab: 137 mg/dL — ABNORMAL HIGH (ref 6–20)
Potassium: 5.4 mmol/L — ABNORMAL HIGH (ref 3.3–5.1)
Sodium: 136 mmol/L (ref 133–145)

## 2012-07-14 LAB — GIANT PLATELETS

## 2012-07-14 LAB — METAMYELOCYTE: Metamyelocyte %: 1 % (ref 0–1)

## 2012-07-14 LAB — MISC. CELL %: Misc. Cell %: 0 % (ref 0–0)

## 2012-07-14 LAB — MANUAL DIFFERENTIAL

## 2012-07-14 LAB — SMUDGE CELLS

## 2012-07-14 LAB — GLUCOSE: Glucose: 108 mg/dL — ABNORMAL HIGH (ref 74–106)

## 2012-07-14 LAB — DIFF BASED ON: Diff Based On: 116 CELLS

## 2012-07-15 ENCOUNTER — Ambulatory Visit
Admit: 2012-07-15 | Discharge: 2012-07-15 | Disposition: A | Discharge status: Home / Self Care | Payer: Self-pay | Source: Ambulatory Visit | Attending: Ophthalmology | Admitting: Ophthalmology

## 2012-07-15 ENCOUNTER — Encounter: Payer: Self-pay | Admitting: Internal Medicine

## 2012-07-15 ENCOUNTER — Inpatient Hospital Stay
Admit: 2012-07-15 | Disposition: A | Discharge status: Skilled Nursing Facility | Payer: Self-pay | Source: Skilled Nursing Facility | Attending: Internal Medicine | Admitting: Internal Medicine

## 2012-07-15 ENCOUNTER — Other Ambulatory Visit: Payer: Self-pay | Admitting: Gastroenterology

## 2012-07-15 DIAGNOSIS — I2510 Atherosclerotic heart disease of native coronary artery without angina pectoris: Secondary | ICD-10-CM | POA: Insufficient documentation

## 2012-07-15 DIAGNOSIS — I10 Essential (primary) hypertension: Secondary | ICD-10-CM | POA: Insufficient documentation

## 2012-07-15 DIAGNOSIS — I6350 Cerebral infarction due to unspecified occlusion or stenosis of unspecified cerebral artery: Secondary | ICD-10-CM | POA: Insufficient documentation

## 2012-07-15 DIAGNOSIS — Z95810 Presence of automatic (implantable) cardiac defibrillator: Secondary | ICD-10-CM | POA: Insufficient documentation

## 2012-07-15 DIAGNOSIS — N179 Acute kidney failure, unspecified: Principal | ICD-10-CM | POA: Diagnosis present

## 2012-07-15 LAB — CBC AND DIFFERENTIAL
Baso # K/uL: 0 10*3/uL (ref 0.0–0.1)
Baso # K/uL: 0 10*3/uL (ref 0.0–0.1)
Basophil %: 0.2 % (ref 0.1–1.2)
Basophil %: 0.2 % (ref 0.1–1.2)
Eos # K/uL: 0.1 10*3/uL (ref 0.0–0.4)
Eos # K/uL: 0.1 10*3/uL (ref 0.0–0.4)
Eosinophil %: 0.6 % — ABNORMAL LOW (ref 0.7–5.8)
Eosinophil %: 0.6 % — ABNORMAL LOW (ref 0.7–5.8)
Hematocrit: 41 % (ref 34–45)
Hematocrit: 40 % (ref 34–45)
Hemoglobin: 13.6 g/dL (ref 11.2–15.7)
Hemoglobin: 13.4 g/dL (ref 11.2–15.7)
Lymph # K/uL: 2.4 10*3/uL (ref 1.2–3.7)
Lymph # K/uL: 2.1 10*3/uL (ref 1.2–3.7)
Lymphocyte %: 18 % — ABNORMAL LOW (ref 19.3–51.7)
Lymphocyte %: 16.9 % — ABNORMAL LOW (ref 19.3–51.7)
MCV: 88 fL (ref 79–95)
MCV: 87 fL (ref 79–95)
Mono # K/uL: 1.1 10*3/uL — ABNORMAL HIGH (ref 0.2–0.9)
Mono # K/uL: 1.2 10*3/uL — ABNORMAL HIGH (ref 0.2–0.9)
Monocyte %: 8.4 % (ref 4.7–12.5)
Monocyte %: 9.4 % (ref 4.7–12.5)
Neut # K/uL: 9.7 10*3/uL — ABNORMAL HIGH (ref 1.6–6.1)
Neut # K/uL: 9.1 10*3/uL — ABNORMAL HIGH (ref 1.6–6.1)
Platelets: 230 10*3/uL (ref 160–370)
Platelets: 261 10*3/uL (ref 160–370)
RBC: 4.7 MIL/uL (ref 3.9–5.2)
RBC: 4.7 MIL/uL (ref 3.9–5.2)
RDW: 16.3 % — ABNORMAL HIGH (ref 11.7–14.4)
RDW: 16.6 % — ABNORMAL HIGH (ref 11.7–14.4)
Seg Neut %: 72.8 % — ABNORMAL HIGH (ref 34.0–71.1)
Seg Neut %: 72.3 % — ABNORMAL HIGH (ref 34.0–71.1)
WBC: 13.3 10*3/uL — ABNORMAL HIGH (ref 4.0–10.0)
WBC: 12.6 10*3/uL — ABNORMAL HIGH (ref 4.0–10.0)

## 2012-07-15 LAB — URINE MICROSCOPIC (IQ200)

## 2012-07-15 LAB — BASIC METABOLIC PANEL
Anion Gap: 17 — ABNORMAL HIGH (ref 7–16)
Anion Gap: 12 (ref 7–16)
Anion Gap: 13 (ref 7–16)
CO2: 19 mmol/L — ABNORMAL LOW (ref 20–28)
CO2: 22 mmol/L (ref 20–28)
CO2: 19 mmol/L — ABNORMAL LOW (ref 20–28)
Calcium: 8.9 mg/dL (ref 8.6–10.2)
Calcium: 8.4 mg/dL — ABNORMAL LOW (ref 8.6–10.2)
Calcium: 8 mg/dL — ABNORMAL LOW (ref 8.6–10.2)
Chloride: 100 mmol/L (ref 96–108)
Chloride: 99 mmol/L (ref 96–108)
Chloride: 104 mmol/L (ref 96–108)
Creatinine: 3 mg/dL — ABNORMAL HIGH (ref 0.51–0.95)
Creatinine: 2.73 mg/dL — ABNORMAL HIGH (ref 0.51–0.95)
Creatinine: 2.63 mg/dL — ABNORMAL HIGH (ref 0.51–0.95)
GFR,Black: 18 * — AB
GFR,Black: 20 * — AB
GFR,Black: 21 * — AB
GFR,Caucasian: 15 * — AB
GFR,Caucasian: 17 * — AB
GFR,Caucasian: 18 * — AB
Glucose: 110 mg/dL — ABNORMAL HIGH (ref 60–99)
Glucose: 110 mg/dL — ABNORMAL HIGH (ref 60–99)
Glucose: 106 mg/dL — ABNORMAL HIGH (ref 60–99)
Lab: 155 mg/dL — ABNORMAL HIGH (ref 6–20)
Lab: 137 mg/dL — ABNORMAL HIGH (ref 6–20)
Lab: 141 mg/dL — ABNORMAL HIGH (ref 6–20)
Potassium: 4.9 mmol/L (ref 3.3–5.1)
Potassium: 5 mmol/L (ref 3.3–5.1)
Sodium: 136 mmol/L (ref 133–145)
Sodium: 133 mmol/L (ref 133–145)
Sodium: 136 mmol/L (ref 133–145)

## 2012-07-15 LAB — URINALYSIS WITH REFLEX TO MICROSCOPIC
Blood,UA: NEGATIVE
Ketones, UA: NEGATIVE
Ketones, UA: NEGATIVE
Nitrite,UA: NEGATIVE
Nitrite,UA: NEGATIVE
Protein,UA: NEGATIVE mg/dL
Protein,UA: NEGATIVE mg/dL
Specific Gravity,UA: 1.015 (ref 1.001–1.030)
Specific Gravity,UA: 1.015 (ref 1.001–1.030)
pH,UA: 5 (ref 5.0–8.0)
pH,UA: 5 (ref 5.0–8.0)

## 2012-07-15 LAB — OSMOLALITY, URINE: Osmolality,UR: 532 mOsm/kg (ref 300–900)

## 2012-07-15 LAB — BLOOD BANK HOLD LAVENDER

## 2012-07-15 LAB — SODIUM, URINE: Sodium,UR: 20 mEq/L

## 2012-07-15 LAB — LACTATE, VENOUS, WHOLE BLOOD: Lactate VEN,WB: 1.4 mmol/L (ref 0.5–2.2)

## 2012-07-15 MED ORDER — ONDANSETRON HCL 2 MG/ML IV SOLN *I*
4.0000 mg | Freq: Four times a day (QID) | INTRAMUSCULAR | Status: DC | PRN
Start: 2012-07-15 — End: 2012-07-25
  Administered 2012-07-17: 4 mg via INTRAVENOUS
  Filled 2012-07-15: qty 2

## 2012-07-15 MED ORDER — ASPIRIN 81 MG PO TBEC *I*
81.0000 mg | DELAYED_RELEASE_TABLET | Freq: Every day | ORAL | Status: DC
Start: 2012-07-15 — End: 2012-07-25
  Administered 2012-07-16 – 2012-07-25 (×10): 81 mg via ORAL
  Filled 2012-07-15 (×10): qty 1

## 2012-07-15 MED ORDER — SERTRALINE HCL 50 MG PO TABS *I*
50.0000 mg | ORAL_TABLET | Freq: Every day | ORAL | Status: DC
Start: 2012-07-15 — End: 2012-07-25
  Administered 2012-07-16 – 2012-07-25 (×10): 50 mg via ORAL
  Filled 2012-07-15 (×10): qty 1

## 2012-07-15 MED ORDER — HEPARIN SODIUM 5000 UNIT/ML SQ *I*
5000.0000 [IU] | Freq: Three times a day (TID) | SUBCUTANEOUS | Status: DC
Start: 2012-07-15 — End: 2012-07-21
  Administered 2012-07-15 – 2012-07-21 (×18): 5000 [IU] via SUBCUTANEOUS
  Filled 2012-07-15 (×18): qty 1

## 2012-07-15 MED ORDER — IPRATROPIUM-ALBUTEROL 0.5-2.5 MG/3ML IN SOLN *I*
3.0000 mL | Freq: Four times a day (QID) | RESPIRATORY_TRACT | Status: DC | PRN
Start: 2012-07-15 — End: 2012-07-25

## 2012-07-15 MED ORDER — CARBIDOPA-LEVODOPA 25-100 MG PO TABS *I*
1.0000 | ORAL_TABLET | Freq: Two times a day (BID) | ORAL | Status: DC
Start: 2012-07-15 — End: 2012-07-25
  Administered 2012-07-15 – 2012-07-25 (×20): 1 via ORAL
  Filled 2012-07-15 (×20): qty 1

## 2012-07-15 MED ORDER — DEXTROSE 5% AND 0.9% NACL IV SOLN *I*
125.0000 mL/h | INTRAVENOUS | Status: DC
Start: 2012-07-15 — End: 2012-07-17
  Administered 2012-07-15 – 2012-07-17 (×4): 125 mL/h via INTRAVENOUS

## 2012-07-15 MED ORDER — SODIUM CHLORIDE 0.9 % IV SOLN WRAPPED *I*
500.0000 mL | Freq: Once | Status: AC
Start: 2012-07-15 — End: 2012-07-15
  Administered 2012-07-15: 500 mL via INTRAVENOUS

## 2012-07-15 MED ORDER — SODIUM CHLORIDE 0.9 % INJ (FLUSH) WRAPPED *I*
5.0000 mL | Freq: Three times a day (TID) | Status: DC
Start: 2012-07-15 — End: 2012-07-25
  Administered 2012-07-15 – 2012-07-24 (×22): 5 mL via INTRAVENOUS

## 2012-07-15 MED ORDER — AMIODARONE HCL 200 MG PO TABS *I*
400.0000 mg | ORAL_TABLET | Freq: Every day | ORAL | Status: DC
Start: 2012-07-15 — End: 2012-07-25
  Administered 2012-07-16 – 2012-07-25 (×10): 400 mg via ORAL
  Filled 2012-07-15 (×10): qty 2

## 2012-07-15 MED ORDER — ACETAMINOPHEN 325 MG PO TABS *I*
650.0000 mg | ORAL_TABLET | Freq: Four times a day (QID) | ORAL | Status: DC | PRN
Start: 2012-07-15 — End: 2012-07-16

## 2012-07-15 MED ORDER — VITAMIN B-12 1000 MCG PO TABS *I*
1000.0000 ug | ORAL_TABLET | Freq: Every day | ORAL | Status: DC
Start: 2012-07-15 — End: 2012-07-25
  Administered 2012-07-16 – 2012-07-25 (×10): 1000 ug via ORAL
  Filled 2012-07-15 (×10): qty 1

## 2012-07-15 MED ORDER — SODIUM CHLORIDE 0.9 % IV SOLN WRAPPED *I*
125.0000 mL/h | Status: DC
Start: 2012-07-15 — End: 2012-07-15
  Administered 2012-07-15: 125 mL/h via INTRAVENOUS

## 2012-07-15 MED ORDER — FAMOTIDINE 20 MG PO TABS *I*
20.0000 mg | ORAL_TABLET | Freq: Two times a day (BID) | ORAL | Status: DC
Start: 2012-07-15 — End: 2012-07-16
  Administered 2012-07-15 – 2012-07-16 (×2): 20 mg via ORAL
  Filled 2012-07-15 (×2): qty 1

## 2012-07-15 MED ORDER — CALCIUM CARBONATE ANTACID 500 MG PO CHEW *I*
1000.0000 mg | CHEWABLE_TABLET | Freq: Three times a day (TID) | ORAL | Status: DC | PRN
Start: 2012-07-15 — End: 2012-07-25

## 2012-07-15 MED ORDER — ATORVASTATIN CALCIUM 10 MG PO TABS *I*
10.0000 mg | ORAL_TABLET | Freq: Every day | ORAL | Status: DC
Start: 2012-07-15 — End: 2012-07-25
  Administered 2012-07-15 – 2012-07-24 (×10): 10 mg via ORAL
  Filled 2012-07-15 (×10): qty 1

## 2012-07-15 NOTE — H&P (Signed)
General H&P for Inpatients    Chief Complaint: AKI, R sided abdominal pain, poor intake    History of Present Illness:  HPI Michelle [name] is a 67 yo AA F LTC resident of the [facility] sent for evaluation of AKI, abdominal pain and leukocytosis. Pmhx CVA, CAD s/p MI w/ ICM EF 30% s/p AICD, CHF, HTN, COPD and remote hx ETOH abuse. Pt is a limited historian due to poor recall.  Recently treated w/ vantin for UTI. Intake has been poor and pt has been on the sleepy side. Today, pt has been c/o R sided abdominal pain. Denies fall, fever, chills, sob, cough, dysuria, N/V/D per pt though pt's details quite limited. Labs drawn yesterday w/ a BUN of 155 and Cr 3. Renal ultrasound showed no hydronephrosis or obstruction.     Past Medical History   Diagnosis Date   . Unspecified cerebral artery occlusion with cerebral infarction    . Coronary artery disease    . CHF (congestive heart failure)    . COPD (chronic obstructive pulmonary disease)    . AICD (automatic cardioverter/defibrillator) present    . Hypertension    . Myocardial infarction june 2013   . CHF (congestive heart failure)      EF 30%   . Alcohol abuse, in remission    . Anemia    . HTN (hypertension)    . Ventricular tachycardia      Past Surgical History   Procedure Laterality Date   . Cardiac defibrillator placement     . Pacemaker insertion       No family history on file.  History     Social History   . Marital Status: Widowed     Spouse Name: N/A     Number of Children: N/A   . Years of Education: N/A     Social History Main Topics   . Smoking status: None   . Smokeless tobacco: None   . Alcohol Use: No      Comment: formerly, heavy drinker   . Drug Use: None   . Sexually Active: None     Other Topics Concern   . None     Social History Narrative   . None       Allergies: No Known Allergies (drug, envir, food or latex)      (Not in a hospital admission)   Current Facility-Administered Medications   Medication Dose Route Frequency   . sodium chloride 0.9 % IV   125 mL/hr Intravenous Continuous     No current outpatient prescriptions on file.       Review of Systems:   Review of Systems   Constitutional: Negative for fever and chills.   HENT: Negative for neck pain.    Eyes: Negative for blurred vision.   Respiratory: Negative for cough and shortness of breath.    Cardiovascular: Negative for chest pain.   Gastrointestinal: Positive for abdominal pain and constipation. Negative for nausea, vomiting and diarrhea.   Genitourinary: Negative for dysuria.   Musculoskeletal: Positive for myalgias and back pain. Negative for joint pain and falls.   Neurological: Negative for headaches.       Last Nursing documented pain: Faces pain rating: 0 (07/15/12 1332)      Patient Vitals for the past 24 hrs:   BP Temp Temp src Pulse Resp SpO2 Height Weight   07/15/12 1512 101/46 mmHg - - 60 17 99 % - -   07/15/12 1332 98/64 mmHg 36.4  C (97.5 F) TEMPORAL - 16 99 % 1.6 m (5' 2.99") 81.647 kg (180 lb)     O2 Device: Nasal cannula (07/15/12 1512)  FiO2: 3 % (07/15/12 1332)  O2 Flow Rate: 3 L/min (07/15/12 1400)      Physical Exam  Gen: Older BF. A and A. O to self, HH, 2014 and June but vague in details about illness.  Skin: No rash  HEENT: NC/AT. Pupils equal. OP patent  Neck: Supple  Lungs: few crackles in both bases but breathing easy.  Card: RRR  Abd: Soft, obese with BS present. ND. TTP in RLQ and R flank. No guarding. Multiple old surgical scars.   Back: No CVAT  GU: foley in  Ext: Moves all limbs equally.     Lab Results:   All labs in the last 72 hours:  Recent Results (from the past 72 hour(s))   CBC AND DIFFERENTIAL    Collection Time    07/15/12  2:22 PM       Result Value Range    WBC 12.6 (*) 4.0 - 10.0 THOU/uL    RBC 4.7  3.9 - 5.2 MIL/uL    Hemoglobin 13.4  11.2 - 15.7 g/dL    Hematocrit 40  34 - 45 %    MCV 87  79 - 95 fL    RDW 16.6 (*) 11.7 - 14.4 %    Platelets 261  160 - 370 THOU/uL    Seg Neut % 72.3 (*) 34.0 - 71.1 %    Lymphocyte % 16.9 (*) 19.3 - 51.7 %    Monocyte % 9.4   4.7 - 12.5 %    Eosinophil % 0.6 (*) 0.7 - 5.8 %    Basophil % 0.2  0.1 - 1.2 %    Neut # K/uL 9.1 (*) 1.6 - 6.1 THOU/uL    Lymph # K/uL 2.1  1.2 - 3.7 THOU/uL    Mono # K/uL 1.2 (*) 0.2 - 0.9 THOU/uL    Eos # K/uL 0.1  0.0 - 0.4 THOU/uL    Baso # K/uL 0.0  0.0 - 0.1 THOU/uL   BASIC METABOLIC PANEL    Collection Time    07/15/12  2:22 PM       Result Value Range    Glucose 110 (*) 60 - 99 mg/dL    Sodium 161  096 - 045 mmol/L    Potassium CANCELED  3.3 - 5.1 mmol/L    Chloride 99  96 - 108 mmol/L    CO2 22  20 - 28 mmol/L    Anion Gap 12  7 - 16    UN 137 (*) 6 - 20 mg/dL    Creatinine 4.09 (*) 0.51 - 0.95 mg/dL    GFR,Caucasian 17 (*)     GFR,Black 20 (*)     Calcium 8.4 (*) 8.6 - 10.2 mg/dL   BLOOD BANK HOLD LAVENDER    Collection Time    07/15/12  2:22 PM       Result Value Range    Bld Bank Hld Lav CANCELED     BASIC METABOLIC PANEL    Collection Time    07/15/12  3:15 PM       Result Value Range    Glucose 106 (*) 60 - 99 mg/dL    Sodium 811  914 - 782 mmol/L    Potassium 5.0  3.3 - 5.1 mmol/L    Chloride 104  96 - 108 mmol/L  CO2 19 (*) 20 - 28 mmol/L    Anion Gap 13  7 - 16    UN 141 (*) 6 - 20 mg/dL    Creatinine 1.61 (*) 0.51 - 0.95 mg/dL    GFR,Caucasian 18 (*)     GFR,Black 21 (*)     Calcium 8.0 (*) 8.6 - 10.2 mg/dL     UA / UC    Blood Culture x2      Radiology Impressions (last 3 days):  No results found.    Currently Active/Followed Hospital Problems:  Active Hospital Problems    Diagnosis   . AKI (acute kidney injury)       Assessment: 67 yo NH resident presents with RLQ and R flank pain, ARF w/ BUN and Cr of 141 and 2.63, leukocytosis. No urine available yet for UA/cx. Yesterday's ultrasound did not show any obstruction or hydronephrosis.     ARF - No documented hx CKD. Possibly d/t ATN as BP has been low. Not on any renally insulting meds or new meds. Intake poor so likely pre renal contributing.   --calculate FeNA  --check UA and micro - requested ED RN to draw  --foley for I/Os  --may need neph  consult  --IVF  --daily BMP, phos     Hypotension  --hold coreg  --ivf  --tele  --serial trops    R sided abdominal pain - Renal ultrasound was normal per NP note  --possibly d/t UTI  --would like a CT but cannot do iv contrast d/t AKI  --UA, micro  --tylenol atc    Leukocytosis  --check CXR  --check UA and micro  --will hold abx until above obtained.     F - d5ns at 125  E - daily  N - BS swallow eval  DNR/DNI - per MOLST    Author: Edwena Bunde, PA  Note created: 07/15/2012  at: 3:36 PM

## 2012-07-15 NOTE — Comprehensive Assessment (Signed)
07/15/12 1411   Demographics   County of Residence [address]   Marital Status Widowed   Ethnicity/Race African American   Risk Factors   Risk Factors Age related issues;Adjustment to Dx/Injury/Illness;Current or planned institutional Placement  (long-term care at [facility] since 08/02/11)   Health Care Directives Screen (Also required for Hospice Patients)   *Has patient (or family) completed any of the following? (select all that apply) MOLST   MOLST available for inclusion in the chart? Yes   Does MOLST designate DNR;DNI   MOLST in chart? Yes   Contacts/Support Systems   Contacts/Support Systems Residential Staff   Agency [facility]    Number [phone]   Relationship long-term care staff   Comments pt has lived there since 08/02/11   Contact Information   Spokesperson Name Georgiann Cocker    Relationship to Patient  Son   Phone Number(s) [phone]   Alternate Spokesperson? No   Emergency Contact other than spokesperson? No   Ride to/from Designer, television/film set Situation   Lives With SNF   Comments [facility]    Home Geography   Type of Home Facility (SNF)   # Of Steps In Home 0   # Steps to Enter Home 0   One Story or Two One story   Bedroom First floor   Bathroom First floor   Utilitites Working Yes   Activities of Daily Living   Transfers Required assistance  (2 assist)   Teaching laboratory technician   Ambulation With assistance  (wc with assist)   Bathing/Grooming With assistance  (staff assist)   Nutrition With assistance  (meals provided, pt feeds herself after setup)   Household Management With assistance  (SNF)   Income Information   Vocational Retired   Health and safety inspector Other (comment)  Nurse, learning disability, Field seismologist)   Pharmacy Used through [employer] not a [employer] obtained from medical record.  Bobbye Riggs, LMSW  07/15/2012  2:13 PM

## 2012-07-15 NOTE — Progress Notes (Signed)
Utilization Management    Level of Care Inpatient as of the date 07/15/2012      Elanore Talcott A Dunya Meiners, RN     Pager: [phone]

## 2012-07-15 NOTE — Comprehensive Assessment (Addendum)
07/15/12 0000   Discharge Planning   Lives With SNF  ([facility])   Can they assist with pt needs after discharge? Yes   *Does patient currently have home care services? No   *Current External Services None   Current Home Equipment Wheelchair-manual;Home O2   Expected Discharge Date (TBD)   Follow for: Discharge Planning   SW Plan SW to follow (see discharge plan)   Information obtained from medical record. Pt resides at the [facility] for long-term care and has lived there since June of 2013. She uses a wheelchair with assist at baseline. SW to follow for return to SNF as needed.  Bobbye Riggs, LMSW  07/15/2012  2:14 PM    SW spoke with Melissa in admissions at [facility] [phone]) who reports that pt has a bed hold. SW faxed referral to the placement office to open [facility].  Bobbye Riggs, LMSW  07/15/2012  2:53 PM

## 2012-07-15 NOTE — ED Notes (Signed)
Bed confirmed on [place] [address]. Report given to [place], [place]    Please call [phone]

## 2012-07-15 NOTE — ED Notes (Signed)
Bed:PA-01<BR> Expected date:07/15/12<BR> Expected time:12:47 PM<BR> Means of arrival:<BR> Comments:<BR> [name],Michelle 10-08-2045  Pt to ed from the [facility]. Called in by Leonie Green NP # [phone] REQUESTING CALL BACK  Pt had recent treatment for UTI with Vantin. Wbc elevated but improving. Afebrile for the last 2 weeks  Several days of R lateral cp/abd pain  Bun/creat elevated. Clysis at 100 and hour started last night. Foley placed. 200cc uop today. normal renal US yesterday  Today worsening renal funct ion   Pt is a DNR/DNI with hospitalization

## 2012-07-15 NOTE — ED Provider Notes (Signed)
History     Chief Complaint   Patient presents with   . Abnormal Lab     Patient is a 67 y.o. female presenting with GENERIC.   History provided by:  Patient and medical records  History limited by:  Acuity of condition  Other    67 year old female sent from SNF due to concerns for dehydration and AKI with BUN/Cr = 137/2.86 9 (on 6/2) despite clysis;   BUN/Cr = 155/3 today    Per NP at SNF, US done yesterday - no hydronephrosis or obstruction    Per PCP (NP Rayann Heman) she normally has capacity  Unable to reach family today    No past medical history on file.         No past surgical history on file.    No family history on file.      Social History      has no tobacco, alcohol, drug, and sexual activity history on file.    Living Situation    Questions Responses    Patient lives with     Homeless     Caregiver for other family member     External Services     Employment     Domestic Violence Risk           Problem List      does not have a problem list on file.    Review of Systems   Review of Systems   Unable to perform ROS      Physical Exam     ED Triage Vitals   BP Pulse Heart Rate(via Pulse Ox) Resp Temp Temp Source SpO2 O2 Device O2 Flow Rate   07/15/12 1332 -- 07/15/12 1332 07/15/12 1332 07/15/12 1332 07/15/12 1332 07/15/12 1332 07/15/12 1332 --   98/64 mmHg  68 16 36.4 C (97.5 F) TEMPORAL 99 % Nasal cannula       Weight           07/15/12 1332           81.647 kg (180 lb)               Physical Exam   Constitutional: She has a sickly appearance. No distress.   HENT:   Head: Normocephalic.   Mouth/Throat: Mucous membranes are dry.   Eyes: No scleral icterus.   Neck: Normal range of motion. Neck supple.   Cardiovascular: Normal rate and regular rhythm.    Pulmonary/Chest: Breath sounds normal. No accessory muscle usage. Tachypnea noted. No respiratory distress.   Abdominal: Soft. Bowel sounds are normal. There is no tenderness.   Neurological: She is alert.   Globally weak   Skin: Skin is warm and dry.  She is not diaphoretic.   Psychiatric: She is slowed.   Follows commands       Medical Decision Making      Amount and/or Complexity of Data Reviewed  Clinical lab tests: ordered  Obtain history from someone other than the patient: yes  Review and summarize past medical records: yes        Initial Evaluation:  ED First Provider Contact    Date/Time Event User Comments    07/15/12 1344 ED Provider First Contact Analilia Geddis E Initial Face to Face Provider Contact          Patient seen by me as above    Assessment:  67 y.o., female comes to the ED with dehydration, rising BUB/Cr    Differential Diagnosis  includes uremia, dehydration, FTT             Plan: IVF, Foley, cbc, bmp, ua; admit to geriatrics      Peterson Lombard, MD          Peterson Lombard, MD  07/15/12 1513

## 2012-07-15 NOTE — Progress Notes (Signed)
Pt arrived to unit via stretcher, not accompanied by any family members. Pt alert and pleasant, c/o right flank pain when pulled over to bed from stretcher. Pt oriented to place [facility]") and situation ("I'm here because my back hurts!"). Pt speech difficult to understand at times, mildly slurry. Pt has previously-placed Foley catheter in place, gravity drainage. Pt given call bell and made comfortable; bed alarm on for safety. Will continue to monitor.    Renne Crigler, RN

## 2012-07-15 NOTE — ED Notes (Signed)
Bed:C-24<BR> Expected date:<BR> Expected time:<BR> Means of arrival:<BR> Comments:<BR>

## 2012-07-16 ENCOUNTER — Encounter: Payer: Self-pay | Admitting: Internal Medicine

## 2012-07-16 LAB — SODIUM, URINE: Sodium,UR: 27 mEq/L

## 2012-07-16 LAB — CREATININE, URINE: Creatinine,UR: 47 mg/dL (ref 20–300)

## 2012-07-16 LAB — BASIC METABOLIC PANEL
Anion Gap: 14 (ref 7–16)
CO2: 19 mmol/L — ABNORMAL LOW (ref 20–28)
Calcium: 8 mg/dL — ABNORMAL LOW (ref 8.6–10.2)
Chloride: 107 mmol/L (ref 96–108)
Creatinine: 1.9 mg/dL — ABNORMAL HIGH (ref 0.51–0.95)
GFR,Black: 31 * — AB
GFR,Caucasian: 27 * — AB
Glucose: 141 mg/dL — ABNORMAL HIGH (ref 60–99)
Lab: 108 mg/dL — ABNORMAL HIGH (ref 6–20)
Potassium: 4 mmol/L (ref 3.3–5.1)
Sodium: 140 mmol/L (ref 133–145)

## 2012-07-16 LAB — CBC AND DIFFERENTIAL
Baso # K/uL: 0 10*3/uL (ref 0.0–0.1)
Basophil %: 0.2 % (ref 0.1–1.2)
Eos # K/uL: 0.1 10*3/uL (ref 0.0–0.4)
Eosinophil %: 0.7 % (ref 0.7–5.8)
Hematocrit: 37 % (ref 34–45)
Hemoglobin: 12 g/dL (ref 11.2–15.7)
Lymph # K/uL: 1.6 10*3/uL (ref 1.2–3.7)
Lymphocyte %: 14.5 % — ABNORMAL LOW (ref 19.3–51.7)
MCV: 89 fL (ref 79–95)
Mono # K/uL: 1.2 10*3/uL — ABNORMAL HIGH (ref 0.2–0.9)
Monocyte %: 11.2 % (ref 4.7–12.5)
Neut # K/uL: 7.9 10*3/uL — ABNORMAL HIGH (ref 1.6–6.1)
Platelets: 195 10*3/uL (ref 160–370)
RBC: 4.2 MIL/uL (ref 3.9–5.2)
RDW: 16.7 % — ABNORMAL HIGH (ref 11.7–14.4)
Seg Neut %: 72.8 % — ABNORMAL HIGH (ref 34.0–71.1)
WBC: 10.9 10*3/uL — ABNORMAL HIGH (ref 4.0–10.0)

## 2012-07-16 LAB — EOSINOPHILS, URINE
Eosinophils,UR: <1 /hpf (ref <1)
SLIDE # (Urine): 2526

## 2012-07-16 LAB — AEROBIC CULTURE

## 2012-07-16 LAB — MAGNESIUM: Magnesium: 2.3 mEq/L — ABNORMAL HIGH (ref 1.3–2.1)

## 2012-07-16 LAB — HOLD GREEN WITH GEL

## 2012-07-16 LAB — PHOSPHORUS: Phosphorus: 3.8 mg/dL (ref 2.7–4.5)

## 2012-07-16 LAB — HOLD BLUE

## 2012-07-16 LAB — UREA NITROGEN, URINE: Urea Nitrogen,UR: 1015 mg/dL

## 2012-07-16 MED ORDER — ACETAMINOPHEN 500 MG PO TABS *I*
1000.0000 mg | ORAL_TABLET | Freq: Three times a day (TID) | ORAL | Status: DC
Start: 2012-07-16 — End: 2012-07-25
  Administered 2012-07-16 – 2012-07-25 (×25): 1000 mg via ORAL
  Filled 2012-07-16 (×28): qty 2

## 2012-07-16 MED ORDER — SODIUM CHLORIDE 0.9 % IV BOLUS *I*
250.0000 mL | Freq: Once | Status: AC
Start: 2012-07-16 — End: 2012-07-16
  Administered 2012-07-16: 250 mL via INTRAVENOUS

## 2012-07-16 MED ORDER — FAMOTIDINE 20 MG PO TABS *I*
20.0000 mg | ORAL_TABLET | Freq: Every day | ORAL | Status: DC
Start: 2012-07-17 — End: 2012-07-17
  Administered 2012-07-17: 20 mg via ORAL
  Filled 2012-07-16: qty 1

## 2012-07-16 NOTE — H&P (Addendum)
Geriatrics H&P    CC: Michelle [name]    HPI:  67 y/o F with MMP Hx of CVA, CAD, CHF, VT s/p ICD with pacemaker, Htn, presenting from NH with Michelle [name] and oliguria. She had been treated with Vantin for 14 days (end 07/15/12).      She was noted to have decreasing urine output and BUN/Cr 155/3. She has also had several days of RLQ and R flank pain. She describes pain as sharp and present all the time. No relieving factors. Movement makes it worse. No trauma to the area. She had a renal U/S that showed no hydronephrosis.      In ED, labs show ed improving BUN/Cr ratio, and improving WBC count.  FAS unremarkable. She had a CT scan which showed mild colitis in descending colon.     Past Medical History   Diagnosis Date   . CVA (cerebral infarction) 10/23/2011   . ASHD (arteriosclerotic heart disease) 10/23/2011   . MI (mitral incompetence) 10/23/2011   . CHF (congestive heart failure) 10/23/2011   . COPD (chronic obstructive pulmonary disease) 10/23/2011   . ETOH abuse 10/23/2011   . VT (ventricular tachycardia) 10/23/2011     S/p AICD   . Anemia of chronic disease 10/23/2011   . HTN (hypertension) 10/23/2011   . Parkinsonism    . Unspecified cerebral artery occlusion with cerebral infarction    . Coronary artery disease    . CHF (congestive heart failure)    . COPD (chronic obstructive pulmonary disease)    . AICD (automatic cardioverter/defibrillator) present    . Hypertension    . Myocardial infarction june 2013   . CHF (congestive heart failure)      EF 30%   . Alcohol abuse, in remission    . Anemia    . HTN (hypertension)    . Ventricular tachycardia        Past Surgical History   Procedure Laterality Date   . Icd     . Hemicolectomy Left    . Cardiac defibrillator placement     . Pacemaker insertion         Home Medications  Prior to Admission medications    Medication Sig Start Date End Date Taking? Authorizing Provider   cyanocobalamin (VITAMIN B-12) 1000 MCG tablet Take 1,000 mcg by mouth daily   Yes [provider]   aspirin 81  MG EC tablet Take 81 mg by mouth daily   Yes [provider]   carbidopa-levodopa (SINEMET) 25-100 MG per tablet Take 1 tablet by mouth 2 times daily   Yes [provider]   famotidine (PEPCID) 20 MG tablet Take 20 mg by mouth 2 times daily   Yes [provider]   Cholecalciferol (VITAMIN D3) 50000 UNITS CAPS Take 50,000 Units by mouth every 28 days   Yes [provider]   carvedilol (COREG) 6.25 MG tablet Take 6.25 mg by mouth daily   Yes [provider]   amiodarone (PACERONE) 400 MG tablet Take 400 mg by mouth daily   Yes [provider]   sertraline (ZOLOFT) 50 MG tablet Take 50 mg by mouth daily   Yes [provider]   atorvastatin (LIPITOR) 10 MG tablet Take 10 mg by mouth daily (with dinner)   Yes [provider]   acetaminophen (TYLENOL) 325 MG tablet Take 650 mg by mouth every 6 hours as needed for Pain   Yes [provider]   HYDROcodone-acetaminophen (NORCO) 5-325 MG per  tablet Take 1 tablet by mouth every 6 hours as needed for Pain   MDD  tablets   Yes [provider]   guaiFENesin (ROBITUSSIN) 100 MG/5ML syrup Take 200 mg by mouth 3 times daily as needed for Congestion   Yes [provider]   sorbitol 70 % solution Take 30 mLs by mouth daily as needed for Constipation   Yes [provider]   ipratropium-albuterol (DUONEB) 0.5-2.5 (3) MG/3ML nebulizer solution Take 3 mLs by nebulization 4 times daily as needed for Wheezing   Yes [provider]   carbidopa-levodopa (SINEMET) 25-100 MG per tablet Take 1 tablet by mouth 2 times daily    [provider]   carvedilol (COREG) 6.25 MG tablet Take 6.25 mg by mouth 2 times daily (with meals)    [provider]   amiodarone (PACERONE) 400 MG tablet Take 400 mg by mouth daily    [provider]   aspirin 81 MG EC tablet Take 81 mg by mouth daily    [provider]   atorvastatin (LIPITOR) 10 MG tablet Take 10 mg  by mouth daily (with dinner)    [provider]   famotidine (PEPCID) 20 MG tablet Take 20 mg by mouth daily    [provider]   sertraline (ZOLOFT) 50 MG tablet Take 50 mg by mouth daily    [provider]   cyanocobalamin (VITAMIN B-12) 1000 MCG tablet Take 1,000 mcg by mouth daily    [provider]   calcium carbonate-vitamin D 600-400 MG-UNIT per tablet Take 1 tablet by mouth 2 times daily    [provider]   bisacodyl (DULCOLAX) 10 MG suppository Place 10 mg rectally daily as needed    [provider]   Acetaminophen (APAP) 325 MG tablet Take 650 mg by mouth every 6 hours as needed    [provider]   sorbitol 70 % solution Take 30 mLs by mouth daily as needed    [provider]        Allergies  has No Known Allergies (drug, envir, food or latex).     Family History  family history is not on file.     Social History   reports that she does not drink alcohol.     ROS:  Negative except HPI    Physical Exam:  Filed Vitals:    07/16/12 0555   BP: 100/50   Pulse: 60   Temp: 36 C (96.8 F)   Resp: 16   Height:    Weight:         Gen: Patient is in NAD, dysarthric   HENT: NC/AT, oral pharynx without lesions, dMM  Eyes: Anicteric, conjunctivae not injected, PERRLA, EOMI  Neck: Normal thyroid, no cervical lymphadenopathy, supple  Lungs: Clear to auscultation, no dullness to percusion  Heart: RRR no m/r/g  Abd: BS+, soft, NT, ND, no HSM  Ext: No e/c/c.  Neuro: A&Ox2, facial asymmetry and dysarthria from previous CVA. Moves extremities.    Laboratory:  Recent Results (from the past 24 hour(s))   CBC AND DIFFERENTIAL    Collection Time    07/15/12  2:22 PM       Result Value Range    WBC 12.6 (*) 4.0 - 10.0 THOU/uL    RBC 4.7  3.9 - 5.2 MIL/uL    Hemoglobin 13.4  11.2 - 15.7 g/dL    Hematocrit 40  34 - 45 %    MCV  87  79 - 95 fL    RDW 16.6 (*) 11.7 - 14.4 %    Platelets 261  160 - 370 THOU/uL    Seg Neut % 72.3 (*) 34.0 - 71.1 %    Lymphocyte %  16.9 (*) 19.3 - 51.7 %    Monocyte % 9.4  4.7 - 12.5 %    Eosinophil % 0.6 (*) 0.7 - 5.8 %    Basophil % 0.2  0.1 - 1.2 %    Neut # K/uL 9.1 (*) 1.6 - 6.1 THOU/uL    Lymph # K/uL 2.1  1.2 - 3.7 THOU/uL    Mono # K/uL 1.2 (*) 0.2 - 0.9 THOU/uL    Eos # K/uL 0.1  0.0 - 0.4 THOU/uL    Baso # K/uL 0.0  0.0 - 0.1 THOU/uL   BASIC METABOLIC PANEL    Collection Time    07/15/12  2:22 PM       Result Value Range    Glucose 110 (*) 60 - 99 mg/dL    Sodium 161  096 - 045 mmol/L    Potassium CANCELED  3.3 - 5.1 mmol/L    Chloride 99  96 - 108 mmol/L    CO2 22  20 - 28 mmol/L    Anion Gap 12  7 - 16    UN 137 (*) 6 - 20 mg/dL    Creatinine 4.09 (*) 0.51 - 0.95 mg/dL    GFR,Caucasian 17 (*)     GFR,Black 20 (*)     Calcium 8.4 (*) 8.6 - 10.2 mg/dL   HOLD BLUE    Collection Time    07/15/12  2:22 PM       Result Value Range    Hold Blue HOLD TUBE     HOLD GREEN WITH GEL    Collection Time    07/15/12  2:22 PM       Result Value Range    Hold Green (w/gel,spun) HOLD TUBE     BLOOD BANK HOLD LAVENDER    Collection Time    07/15/12  2:22 PM       Result Value Range    Bld Bank Hld Lav CANCELED     BASIC METABOLIC PANEL    Collection Time    07/15/12  3:15 PM       Result Value Range    Glucose 106 (*) 60 - 99 mg/dL    Sodium 811  914 - 782 mmol/L    Potassium 5.0  3.3 - 5.1 mmol/L    Chloride 104  96 - 108 mmol/L    CO2 19 (*) 20 - 28 mmol/L    Anion Gap 13  7 - 16    UN 141 (*) 6 - 20 mg/dL    Creatinine 9.56 (*) 0.51 - 0.95 mg/dL    GFR,Caucasian 18 (*)     GFR,Black 21 (*)     Calcium 8.0 (*) 8.6 - 10.2 mg/dL   URINALYSIS WITH REFLEX TO MICROSCOPIC    Collection Time    07/15/12  6:14 PM       Result Value Range    Color, UA Lt Yellow (*) Yellow    Appearance,UR CLEAR  Clear    Specific Gravity,UA 1.015  1.001 - 1.030    Leuk Esterase,UA 2+ (*) NEGATIVE    Nitrite,UA NEG  NEGATIVE    pH,UA 5.0  5.0 - 8.0    Protein,UA NEG  NEGATIVE mg/dL  Glucose,UA NORM      Ketones, UA NEG  NEGATIVE    Blood,UA TRACE (*) NEGATIVE   OSMOLALITY, URINE     Collection Time    07/15/12  6:14 PM       Result Value Range    Osmolality,UR 532  300 - 900 mOsm/kg   SODIUM, URINE    Collection Time    07/15/12  6:14 PM       Result Value Range    Sodium,UR 20     URINE MICROSCOPIC (IQ200)    Collection Time    07/15/12  6:14 PM       Result Value Range    RBC,UA 3 - 10 (*) 0 - 2 /hpf    WBC,UA 11 - 50 (*) 0 - 5 /hpf    Bacteria,UA 2+ (*)     Granular Casts,UA 3 - 10 (*)     Squam Epithel,UA 1+  0-1+ /hpf    Yeast,UA 1+ (*)     Other,UR See Text     URINALYSIS WITH REFLEX TO MICROSCOPIC    Collection Time    07/15/12  6:31 PM       Result Value Range    Color, UA Lt Yellow (*) Yellow    Appearance,UR CLEAR  Clear    Specific Gravity,UA 1.015  1.001 - 1.030    Leuk Esterase,UA TRACE (*) NEGATIVE    Nitrite,UA NEG  NEGATIVE    pH,UA 5.0  5.0 - 8.0    Protein,UA NEG  NEGATIVE mg/dL    Glucose,UA NORM      Ketones, UA NEG  NEGATIVE    Blood,UA NEG  NEGATIVE   URINE MICROSCOPIC (IQ200)    Collection Time    07/15/12  6:31 PM       Result Value Range    RBC,UA 0 - 2  0 - 2 /hpf    WBC,UA 6 - 10 (*) 0 - 5 /hpf    Bacteria,UA 2+ (*)     Granular Casts,UA 0 - 2      Squam Epithel,UA 1+  0-1+ /hpf    Yeast,UA 1+ (*)     Other,UR See Text     LACTATE, VENOUS, WHOLE BLOOD    Collection Time    07/15/12  6:45 PM       Result Value Range    Lactate VEN,WB 1.4  0.5 - 2.2 mmol/L   BASIC METABOLIC PANEL    Collection Time    07/16/12  5:21 AM       Result Value Range    Glucose 141 (*) 60 - 99 mg/dL    Sodium 409  811 - 914 mmol/L    Potassium 4.0  3.3 - 5.1 mmol/L    Chloride 107  96 - 108 mmol/L    CO2 19 (*) 20 - 28 mmol/L    Anion Gap 14  7 - 16    UN 108 (*) 6 - 20 mg/dL    Creatinine 7.82 (*) 0.51 - 0.95 mg/dL    GFR,Caucasian 27 (*)     GFR,Black 31 (*)     Calcium 8.0 (*) 8.6 - 10.2 mg/dL   PHOSPHORUS    Collection Time    07/16/12  5:21 AM       Result Value Range    Phosphorus 3.8  2.7 - 4.5 mg/dL   MAGNESIUM    Collection Time    07/16/12  5:21 AM  Result Value Range    Magnesium 2.3 (*) 1.3 -  2.1 mEq/L   CBC AND DIFFERENTIAL    Collection Time    07/16/12  5:21 AM       Result Value Range    WBC 10.9 (*) 4.0 - 10.0 THOU/uL    RBC 4.2  3.9 - 5.2 MIL/uL    Hemoglobin 12.0  11.2 - 15.7 g/dL    Hematocrit 37  34 - 45 %    MCV 89  79 - 95 fL    RDW 16.7 (*) 11.7 - 14.4 %    Platelets 195  160 - 370 THOU/uL    Seg Neut % 72.8 (*) 34.0 - 71.1 %    Lymphocyte % 14.5 (*) 19.3 - 51.7 %    Monocyte % 11.2  4.7 - 12.5 %    Eosinophil % 0.7  0.7 - 5.8 %    Basophil % 0.2  0.1 - 1.2 %    Neut # K/uL 7.9 (*) 1.6 - 6.1 THOU/uL    Lymph # K/uL 1.6  1.2 - 3.7 THOU/uL    Mono # K/uL 1.2 (*) 0.2 - 0.9 THOU/uL    Eos # K/uL 0.1  0.0 - 0.4 THOU/uL    Baso # K/uL 0.0  0.0 - 0.1 THOU/uL        Imaging:  Abdomen Fas With Pa Chest    07/15/2012  IMPRESSION:   1. No acute pulmonary infiltrate.   2. Unremarkable bowel gas pattern without evidence of bowel  obstruction or free intraperitoneal air.   END OF REPORT     Assessment and Plan: 67 y/o F with MMP including recent UTI, CAD, VT s/p AICD, parkinsonism presenting with renal failure and abdominal pain.     ATN: Likely from prerenal Michelle [name] from decreased PO vs recent urosepsis vs AIN.   - BUN/Cr already improving and she put out >0.5mg /kg/hr urine yesterday.   - No hydro on renal U/S or CT abdomen  - Check urine eos  - Continue to monitor urine output via foley    Abdominal pain  Unclear etiology. DDx includes referred pain from mild descending colitis, gall stone/sludge,renal stone, MSK,  - afebrile, HD stable. No UTI, appendix wnl.  - no renal stones on CT  - Pain control with tylenol 1g TID  - Repeat Abd U/s If pain worsens    CAD/VT s/p ICD  - Continue Atorvastatin/ASA/coreg/amioderone  - HR stale at 60. AV paced. D/c tele    Hx of parkinsonism  - Home sinemet    Hx of CVA  - Continue Atorvastatin/ASA    GERD  - Pepcid     Depression  - Zoloft    FEN  F: PO  E: Daily  N: Reg.     DVT ppx: IPC      CODE: DNR/DNI    Author: Allene Dillon, MD  as of: 07/16/2012 at: 9:07 AM  Pager  [phone]    Attending:    I saw and evaluated the patient. I agree with the resident's/fellow's findings and plan of care as documented above. Details of my evaluation are as follows:     Patient is here with Michelle [name] on CKD, likely pre-renal with poor PO intake at her nursing home and recent UTI but also could be intra-renal.  Post-renal less likely d/t negative ultrasound at the nursing home.  Creatinine is now improving with IVF so we will continue this for now.  CT abdomen unimpressive  for acute pathology but patient continues to have right sided abdominal pain.  Will continue to monitor this clinically.      Hessie Knows, MD

## 2012-07-16 NOTE — Progress Notes (Addendum)
Per night RN, pt drank oral contrast for CT w/out issue, no evidence of aspiration or coughing. When this writer gave pt her evening pills, pt took pills w/ out issue; coughed once. Swallow noted, slightly delayed, but no evidence of pocketing pills or fluid. Pt states she is thirsty, requesting something to drink and eat. Will follow up with providing team regarding NPO status.     Renne Crigler, RN

## 2012-07-17 LAB — BASIC METABOLIC PANEL
Anion Gap: 12 (ref 7–16)
CO2: 17 mmol/L — ABNORMAL LOW (ref 20–28)
Calcium: 7.8 mg/dL — ABNORMAL LOW (ref 8.6–10.2)
Chloride: 119 mmol/L — ABNORMAL HIGH (ref 96–108)
Creatinine: 1.16 mg/dL — ABNORMAL HIGH (ref 0.51–0.95)
GFR,Black: 56 * — AB
GFR,Caucasian: 49 * — AB
Glucose: 108 mg/dL — ABNORMAL HIGH (ref 60–99)
Lab: 66 mg/dL — ABNORMAL HIGH (ref 6–20)
Potassium: 4.5 mmol/L (ref 3.3–5.1)
Sodium: 148 mmol/L — ABNORMAL HIGH (ref 133–145)

## 2012-07-17 LAB — CBC AND DIFFERENTIAL
Baso # K/uL: 0 10*3/uL (ref 0.0–0.1)
Basophil %: 0.2 % (ref 0.1–1.2)
Eos # K/uL: 0.1 10*3/uL (ref 0.0–0.4)
Eosinophil %: 0.9 % (ref 0.7–5.8)
Hematocrit: 37 % (ref 34–45)
Hemoglobin: 11.7 g/dL (ref 11.2–15.7)
Lymph # K/uL: 2.1 10*3/uL (ref 1.2–3.7)
Lymphocyte %: 21.8 % (ref 19.3–51.7)
MCV: 90 fL (ref 79–95)
Mono # K/uL: 1.3 10*3/uL — ABNORMAL HIGH (ref 0.2–0.9)
Monocyte %: 13.8 % — ABNORMAL HIGH (ref 4.7–12.5)
Neut # K/uL: 6 10*3/uL (ref 1.6–6.1)
Platelets: 178 10*3/uL (ref 160–370)
RBC: 4.1 MIL/uL (ref 3.9–5.2)
RDW: 17.2 % — ABNORMAL HIGH (ref 11.7–14.4)
Seg Neut %: 62.7 % (ref 34.0–71.1)
WBC: 9.6 10*3/uL (ref 4.0–10.0)

## 2012-07-17 LAB — PHOSPHORUS: Phosphorus: 2.3 mg/dL — ABNORMAL LOW (ref 2.7–4.5)

## 2012-07-17 LAB — MAGNESIUM: Magnesium: 1.9 mEq/L (ref 1.3–2.1)

## 2012-07-17 MED ORDER — PANTOPRAZOLE SODIUM 40 MG PO TBEC *I*
40.0000 mg | DELAYED_RELEASE_TABLET | Freq: Every morning | ORAL | Status: DC
Start: 2012-07-17 — End: 2012-07-19
  Administered 2012-07-17 – 2012-07-18 (×2): 40 mg via ORAL
  Filled 2012-07-17 (×2): qty 1

## 2012-07-17 MED ORDER — SODIUM CHLORIDE 0.9 % IV SOLN WRAPPED *I*
75.0000 mL/h | Status: DC
Start: 2012-07-17 — End: 2012-07-17
  Administered 2012-07-17: 75 mL/h via INTRAVENOUS

## 2012-07-17 MED ORDER — SODIUM CHLORIDE 0.45 % IV SOLN *I*
75.0000 mL/h | INTRAVENOUS | Status: DC
Start: 2012-07-17 — End: 2012-07-21
  Administered 2012-07-17 – 2012-07-20 (×7): 75 mL/h via INTRAVENOUS

## 2012-07-17 NOTE — Progress Notes (Addendum)
Hospital Medicine Progress Note     LOS: 2 days     Subjective:   Hypotensive to 74s systolic. S/p 250cc bolus. BP to 90s-100s. Good urine out put. Abd pain present but thinks slightly better. Had BM. No nausea/emesis.     Objective:  BP: (74-110)/(40-60)   Temp:  [35.9 C (96.6 F)-36.8 C (98.2 F)]   Temp src:  [-]   Heart Rate:  [60-64]   Resp:  [16]   SpO2:  [92 %-97 %]     Last set of vitals:  Filed Vitals:    07/17/12 0602   BP: 100/60   Pulse: 60   Temp: 36 C (96.8 F)   Resp: 16   Height:    Weight:          Intake/Output Summary (Last 24 hours) at 07/17/12 0839  Last data filed at 07/17/12 0602   Gross per 24 hour   Intake    495 ml   Output   1325 ml   Net   -830 ml       Gen: Patient is in NAD, dysarthric   HENT: NC/AT, oral pharynx without lesions, MMM   Eyes: Anicteric, conjunctivae not injected, PERRLA, EOMI   Neck: Normal thyroid, no cervical lymphadenopathy, supple   Lungs: Clear to auscultation, no dullness to percusion   Heart: RRR no m/r/g   Abd: BS+, soft, TTP in RLQ , ND, no HSM   Ext: No e/c/c.   Neuro: A&Ox2, facial asymmetry and dysarthria from previous CVA. Moves extremities        Recent Labs  Lab 07/17/12  4540 07/16/12  0521 07/15/12  1422   WBC 9.6 10.9* 12.6*   Hemoglobin 11.7 12.0 13.4   Hematocrit 37 37 40   Platelets 178 195 261   Seg Neut % 62.7 72.8* 72.3*   Lymphocyte % 21.8 14.5* 16.9*   Monocyte % 13.8* 11.2 9.4   Eosinophil % 0.9 0.7 0.6*        Recent Labs  Lab 07/16/12  0521 07/15/12  1515 07/15/12  1422   Sodium 140 136 133   Potassium 4.0 5.0 CANCELED   Chloride 107 104 99   CO2 19* 19* 22   UN 108* 141* 137*   Creatinine 1.90* 2.63* 2.73*   Calcium 8.0* 8.0* 8.4*   Glucose 141* 106* 110*     @labrcntip (PT:3,INR:3,PTT:3)  @labrcntip (trp:3,ck:3,ckmb:3)  No results found for this basename: PROT, LABALBU, AST, ALT, BILITOT, BILIDIR, ALKPHOS,  in the last 168 hours     Other labs:    Radiology:  Ct Abdomen And Pelvis Without Contrast    07/16/2012  IMPRESSION:   Slight wall  thickening of the proximal descending colon with free  fluid in the pelvis which could relate to some mild colitis.  Diverticula are not definitely seen in this region but infectious  colitides are in the differential.   Colonic diverticulosis in the region of the right colon and sigmoid  colon   There is an L1 burst fracture with a least a 70% loss of height and a  small amount of retropulsion of bone. This is of indeterminate age  but could be subacute.   Sludge and stones within the gallbladder.   Normal appendix.   Questionable left breast nodular density. This could relate to benign  or malignant disease. If the patient has not had recent mammography  further evaluation is recommended.   Advanced atherosclerotic disease.   Pulmonary nodular density. This  could relate to benign or malignant  disease and correlation with a chest CT is recommended on a  nonemergent basis.   Examination was reviewed with Marc Morgans at 1045 hrs on 07/16/2012 due  to differences between the preliminary report and the final report.   END OF REPORT     Abdomen Fas With Pa Chest    07/15/2012  IMPRESSION:   1. No acute pulmonary infiltrate.   2. Unremarkable bowel gas pattern without evidence of bowel  obstruction or free intraperitoneal air.   END OF REPORT       Current Meds:  Scheduled Meds:  . acetaminophen  1,000 mg Oral TID   . famotidine  20 mg Oral Daily   . amiodarone  400 mg Oral Daily   . aspirin  81 mg Oral Daily   . atorvastatin  10 mg Oral Daily with dinner   . carbidopa-levodopa  1 tablet Oral BID   . cyanocobalamin  1,000 mcg Oral Daily   . sertraline  50 mg Oral Daily   . sodium chloride  5 mL Intravenous Q8H   . heparin (porcine)  5,000 Units Subcutaneous Q8H     Continuous Infusions:  . dextrose 5 % and 0.9% NaCl 125 mL/hr (07/17/12 0252)     PRN Meds:.  . ipratropium-albuterol  3 mL Nebulization 4x Daily PRN   . ondansetron  4 mg Intravenous Q6H PRN   . calcium carbonate  1,000 mg Oral Q8H PRN       Assessment and Plan:    67 y/o F with MMP including recent UTI, CAD, VT s/p AICD, parkinsonism presenting with renal failure and abdominal pain.     ATN: Likely from prerenal AKI from decreased PO vs recent urosepsis vs AIN.   - Good urine out put  - Post ATN diuresis.   - Match Is with Os to avoid volume depletion/ hypotension. NS@75   - Check labs this AM  - No hydro on renal U/S or CT abdomen   - No urine eos  - FE urea intrarenal   - Continue to monitor urine output via foley     Abdominal pain   Unclear etiology. DDx includes referred pain from mild descending colitis, gall stone/sludge,renal stone, MSK,   - afebrile, HD stable. No UTI, appendix wnl.   - no renal stones on CT   - Pain control with tylenol 1g TID   - Repeat Abd U/s If pain worsens     CAD/VT s/p ICD   - Continue Atorvastatin/ASA/coreg/amioderone      Hx of parkinsonism   - Home sinemet     Hx of CVA   - Continue Atorvastatin/ASA     GERD   - Pepcid     Depression   - Zoloft     Leukocytosis from recent UTI  - Resolved    FEN   F: PO/ IVF   E: Daily   N: Reg.   DVT ppx: IPC   CODE: DNR/DNI        Signed by Margo Aye, MD  X [id number]  IM R2      Attending:    I saw and evaluated the patient. I agree with the resident's/fellow's findings and plan of care as documented above. Details of my evaluation are as follows:     Creatinine / BUN much improved today with fluid, PO intake remains suboptimal.  Patient reports significant nausea with food intake, states this has been the case for  several weeks, also has heartburn.  Gallstones noted on CT without GB wall thickening or other signs of cholecystitis.  She continues to have tenderness on the RUQ when palpated.  Will switch fluids to 1/2 NS given hypernatremia.  Continue to monitor daily labs.  Will trial a PPI to see if her nausea / pain represents dyspepsia.    Hessie Knows, MD

## 2012-07-18 LAB — CBC AND DIFFERENTIAL
Baso # K/uL: 0 10*3/uL (ref 0.0–0.1)
Basophil %: 0.4 % (ref 0.1–1.2)
Eos # K/uL: 0.1 10*3/uL (ref 0.0–0.4)
Eosinophil %: 1 % (ref 0.7–5.8)
Hematocrit: 35 % (ref 34–45)
Hemoglobin: 11 g/dL — ABNORMAL LOW (ref 11.2–15.7)
Lymph # K/uL: 1.7 10*3/uL (ref 1.2–3.7)
Lymphocyte %: 21.8 % (ref 19.3–51.7)
MCV: 89 fL (ref 79–95)
Mono # K/uL: 1 10*3/uL — ABNORMAL HIGH (ref 0.2–0.9)
Monocyte %: 13.4 % — ABNORMAL HIGH (ref 4.7–12.5)
Neut # K/uL: 4.8 10*3/uL (ref 1.6–6.1)
Platelets: 159 10*3/uL — ABNORMAL LOW (ref 160–370)
RBC: 3.9 MIL/uL (ref 3.9–5.2)
RDW: 17.1 % — ABNORMAL HIGH (ref 11.7–14.4)
Seg Neut %: 62.7 % (ref 34.0–71.1)
WBC: 7.6 10*3/uL (ref 4.0–10.0)

## 2012-07-18 LAB — BASIC METABOLIC PANEL
Anion Gap: 7 (ref 7–16)
CO2: 22 mmol/L (ref 20–28)
Calcium: 7.9 mg/dL — ABNORMAL LOW (ref 8.6–10.2)
Chloride: 116 mmol/L — ABNORMAL HIGH (ref 96–108)
Creatinine: 0.88 mg/dL (ref 0.51–0.95)
GFR,Black: 78 *
GFR,Caucasian: 68 *
Glucose: 87 mg/dL (ref 60–99)
Lab: 34 mg/dL — ABNORMAL HIGH (ref 6–20)
Potassium: 3.8 mmol/L (ref 3.3–5.1)
Sodium: 145 mmol/L (ref 133–145)

## 2012-07-18 LAB — C DIFFICILE TOXIN B PCR: C difficile toxin B PCR: POSITIVE

## 2012-07-18 LAB — PHOSPHORUS: Phosphorus: 1.7 mg/dL — ABNORMAL LOW (ref 2.7–4.5)

## 2012-07-18 LAB — MAGNESIUM: Magnesium: 1.5 mEq/L (ref 1.3–2.1)

## 2012-07-18 LAB — CLOSTRIDIUM DIFFICILE EIA: C difficile Toxins A and B EIA: 0

## 2012-07-18 MED ORDER — METRONIDAZOLE 250 MG PO TABS *I*
500.0000 mg | ORAL_TABLET | Freq: Three times a day (TID) | ORAL | Status: DC
Start: 2012-07-19 — End: 2012-07-25
  Administered 2012-07-19 – 2012-07-25 (×20): 500 mg via ORAL
  Filled 2012-07-18 (×20): qty 2

## 2012-07-18 NOTE — Significant Event (Signed)
Medical PA    Notified by lab that patient is C Diff + from sample obtained 07/17/12 at 2109.  Started on Flagyl PO.  Alerted nursing.

## 2012-07-18 NOTE — Interdisciplinary Rounds (Signed)
Interdisciplinary Rounds Note    Date: 07/18/2012   Time: 10:40 AM   Attendance:  Care Coordinator and Social Worker    Admit Date/Time:  07/15/2012  2:54 PM    Principal Problem: <principal problem not specified>  Problem List:   Patient Active Problem List    Diagnosis Date Noted   . AKI (acute kidney injury) 07/15/2012   . Unspecified cerebral artery occlusion with cerebral infarction    . Coronary artery disease    . CHF (congestive heart failure)    . COPD (chronic obstructive pulmonary disease)    . AICD (automatic cardioverter/defibrillator) present    . Hypertension    . Parkinsonism    . CVA (cerebral infarction) 10/23/2011   . ASHD (arteriosclerotic heart disease) 10/23/2011   . MI (mitral incompetence) 10/23/2011   . CHF (congestive heart failure) 10/23/2011   . COPD (chronic obstructive pulmonary disease) 10/23/2011   . ETOH abuse 10/23/2011   . VT (ventricular tachycardia) 10/23/2011     S/p AICD     . Anemia of chronic disease 10/23/2011   . HTN (hypertension) 10/23/2011       The patient's problem list and interdisciplinary care plan was reviewed.    Discharge Planning  Lives in: SNF        Lives With: Skilled Nursing (comment)  Can they assist with pt needs after discharge?: Yes  *Does patient currently have home care services?: No     *Current External Services: None                      Plan    Anticipated Discharge Date:     Discharge Disposition: SNF Long Term Care

## 2012-07-18 NOTE — Progress Notes (Signed)
Geriatrics Progress Note     LOS: 3 days     Subjective/interval events: No overnight events. Nursing reports OK po intake in the last 24 hours, had to be given zofran x1 for nausea but for the most part just seems to lack a good appetite.  Patient tells me that her pain is improved from yesterday and that the nausea is gone.  She is moving her bowels.    Objective:  BP: (92-118)/(48-66)   Temp:  [35.8 C (96.4 F)-36.9 C (98.4 F)]   Temp src:  [-]   Heart Rate:  [59-60]   Resp:  [16-18]   SpO2:  [94 %-100 %]     Last set of vitals:  Filed Vitals:    07/18/12 0932   BP: 118/48   Pulse: 60   Temp: 36.3 C (97.3 F)   Resp: 18   Height:    Weight:          Intake/Output Summary (Last 24 hours) at 07/18/12 1205  Last data filed at 07/18/12 1010   Gross per 24 hour   Intake 1188.75 ml   Output   1925 ml   Net -736.25 ml       Gen: awake, alert, interactive, no distress   HEENT: MMM, anicteric  Neck: Normal thyroid, no cervical lymphadenopathy, supple   Lungs: Clear to auscultation, no dullness to percusion   Heart: RRR no m/r/g   Abd: soft, NTND.  Negative murphy's sign.        Recent Labs  Lab 07/18/12  0541 07/17/12  0638 07/16/12  0521   WBC 7.6 9.6 10.9*   Hemoglobin 11.0* 11.7 12.0   Hematocrit 35 37 37   Platelets 159* 178 195   Seg Neut % 62.7 62.7 72.8*   Lymphocyte % 21.8 21.8 14.5*   Monocyte % 13.4* 13.8* 11.2   Eosinophil % 1.0 0.9 0.7        Recent Labs  Lab 07/18/12  0541 07/17/12  0638 07/16/12  0521   Sodium 145 148* 140   Potassium 3.8 4.5 4.0   Chloride 116* 119* 107   CO2 22 17* 19*   UN 34* 66* 108*   Creatinine 0.88 1.16* 1.90*   Calcium 7.9* 7.8* 8.0*   Glucose 87 108* 141*     @labrcntip (PT:3,INR:3,PTT:3)  @labrcntip (trp:3,ck:3,ckmb:3)  No results found for this basename: PROT, LABALBU, AST, ALT, BILITOT, BILIDIR, ALKPHOS,  in the last 168 hours     Other labs:    Radiology:  Ct Abdomen And Pelvis Without Contrast    07/16/2012  IMPRESSION:   Slight wall thickening of the proximal descending colon  with free  fluid in the pelvis which could relate to some mild colitis.  Diverticula are not definitely seen in this region but infectious  colitides are in the differential.   Colonic diverticulosis in the region of the right colon and sigmoid  colon   There is an L1 burst fracture with a least a 70% loss of height and a  small amount of retropulsion of bone. This is of indeterminate age  but could be subacute.   Sludge and stones within the gallbladder.   Normal appendix.   Questionable left breast nodular density. This could relate to benign  or malignant disease. If the patient has not had recent mammography  further evaluation is recommended.   Advanced atherosclerotic disease.   Pulmonary nodular density. This could relate to benign or malignant  disease and correlation with a  chest CT is recommended on a  nonemergent basis.   Examination was reviewed with Marc Morgans at 1045 hrs on 07/16/2012 due  to differences between the preliminary report and the final report.   END OF REPORT     Abdomen Fas With Pa Chest    07/15/2012  IMPRESSION:   1. No acute pulmonary infiltrate.   2. Unremarkable bowel gas pattern without evidence of bowel  obstruction or free intraperitoneal air.   END OF REPORT       Current Meds:  Scheduled Meds:  . pantoprazole  40 mg Oral QAM   . acetaminophen  1,000 mg Oral TID   . amiodarone  400 mg Oral Daily   . aspirin  81 mg Oral Daily   . atorvastatin  10 mg Oral Daily with dinner   . carbidopa-levodopa  1 tablet Oral BID   . cyanocobalamin  1,000 mcg Oral Daily   . sertraline  50 mg Oral Daily   . sodium chloride  5 mL Intravenous Q8H   . heparin (porcine)  5,000 Units Subcutaneous Q8H     Continuous Infusions:  . sodium chloride 75 mL/hr (07/18/12 0457)     PRN Meds:.  . ipratropium-albuterol  3 mL Nebulization 4x Daily PRN   . ondansetron  4 mg Intravenous Q6H PRN   . calcium carbonate  1,000 mg Oral Q8H PRN       Assessment and Plan: 67 y/o F with MMP including recent UTI, CAD, VT s/p  AICD, parkinsonism presenting with renal failure and abdominal pain.     AKI - great response to IVF, creatinine down to 0.88 from 3.0 on admission.  Baseline 0.8 - 0.9.  Continue IVF today while PO intake is suboptimal.    Abdominal pain - unclear etiology.  This has always been right sided during admission, at times associated with nausea and lack of appetite.  CT did demonstrate diverticulosis on the right side but no other pathology.  Pain is better today on my exam, but she was still complaining of RUQ pain to PA Marcoccia.  --continue PPI trial for possible dyspepsia / gastritis  --RUQ ultrasound today  --continue zofran PRN  --encourage PO    Hypernatremia- likely secondary to poor PO intake, resolved today with the 1/2 NS.      CAD/VT s/p ICD   - Continue Atorvastatin/ASA/coreg/amioderone    Hx of parkinsonism   - Home sinemet     Hx of CVA   - Continue Atorvastatin/ASA     GERD   - Pepcid     Depression   - Zoloft     DNR/DNI    Dispo - TBD, likely early next week      Hessie Knows, MD

## 2012-07-19 NOTE — Progress Notes (Signed)
Inpatient Medicine Progress Note ([facility]) - LOS: 4    Assessment/Plan  1. cdiff colitis - started on flagyl - probable cause of her abd pain and nausea yesterday; will DC PPI (especially in light of cdiff)  2. AKInjury nearly resolved. Taking some po but not adequate yet. Hopefully treatment of cdiff will help this. Continue maint IVF for now.  3. Parkinsons - continue sinemet     Active Hospital Problems    Diagnosis   . AKI (acute kidney injury)      Resolved Hospital Problems    Diagnosis   No resolved problems to display.       DC Planning  Early Next week    Interval History:  Had loose BM; abd pain improved. + for cdiff. No nausea this AM. Tolerated first dose of flagyl (2AM)    Medications brief:  Scheduled Meds:  . metroNIDAZOLE  500 mg Oral Q8H SCH   . pantoprazole  40 mg Oral QAM   . acetaminophen  1,000 mg Oral TID   . amiodarone  400 mg Oral Daily   . aspirin  81 mg Oral Daily   . atorvastatin  10 mg Oral Daily with dinner   . carbidopa-levodopa  1 tablet Oral BID   . cyanocobalamin  1,000 mcg Oral Daily   . sertraline  50 mg Oral Daily   . sodium chloride  5 mL Intravenous Q8H   . heparin (porcine)  5,000 Units Subcutaneous Q8H     Continuous Infusions:  . sodium chloride 75 mL/hr (07/19/12 0814)     PRN Meds:.ipratropium-albuterol, ondansetron, calcium carbonate    Medications given (24h):  acetaminophen (TYLENOL) tablet 1,000 mg    Date Action Dose Route User    07/18/2012 2051 Given 1000 mg Oral Vira Agar, RN    07/18/2012 1542 Given 1000 mg Oral Genoveva Ill, RN    07/18/2012 1610 Given 1000 mg Oral Genoveva Ill, RN      amiodarone (PACERONE) tablet 400 mg    Date Action Dose Route User    07/18/2012 0917 Given 400 mg Oral Genoveva Ill, RN      aspirin EC tablet 81 mg    Date Action Dose Route User    07/18/2012 0917 Given 81 mg Oral Genoveva Ill, RN      atorvastatin (LIPITOR) tablet 10 mg    Date Action Dose Route User    07/18/2012 1812 Given 10 mg Oral Genoveva Ill, RN       carbidopa-levodopa (SINEMET) 25-100 MG per tablet 1 tablet    Date Action Dose Route User    07/18/2012 1812 Given 1 tablet Oral Genoveva Ill, RN    07/18/2012 9604 Given 1 tablet Oral Genoveva Ill, RN      heparin (porcine) SQ injection 5,000 Units    Date Action Dose Route User    07/19/2012 0252 Given 5000 Units Subcutaneous Vira Agar, RN    07/18/2012 1812 Given 5000 Units Subcutaneous Genoveva Ill, RN    07/18/2012 5409 Given 5000 Units Subcutaneous Genoveva Ill, RN      metroNIDAZOLE (FLAGYL) tablet 500 mg    Date Action Dose Route User    07/19/2012 0252 Given 500 mg Oral Vira Agar, RN      pantoprazole (PROTONIX) EC tablet 40 mg    Date Action Dose Route User    07/18/2012 0917 Given 40 mg Oral Genoveva Ill, RN      sertraline (ZOLOFT) tablet 50 mg  Date Action Dose Route User    07/18/2012 [phone] Given 50 mg Oral Genoveva Ill, RN      Sodium Chloride 0.45% Infusion    Date Action Dose Route User    07/19/2012 0814 New Bag 75 mL/hr Intravenous Cheryln Manly, RN    07/18/2012 1813 New Bag 75 mL/hr Intravenous Genoveva Ill, RN      sodium chloride 0.9 % flush 5 mL    Date Action Dose Route User    07/19/2012 0252 Given 5 mL Intravenous Vira Agar, RN      cyanocobalamin (Vitamin B-12) tablet 1,000 mcg    Date Action Dose Route User    07/18/2012 0917 Given 1000 mcg Oral Genoveva Ill, RN          Intake/Output    Intake/Output Summary (Last 24 hours) at 07/19/12 0910  Last data filed at 07/18/12 1917   Gross per 24 hour   Intake   1930 ml   Output    850 ml   Net   1080 ml        Vital Signs Ranges:   Temp:  [36 C (96.8 F)-36.3 C (97.3 F)] 36 C (96.8 F)  Heart Rate:  [60-62] 62  Resp:  [18] 18  BP: (108-119)/(48-62) 110/62 mmHg     Physical Exam:  Blood pressure 110/62, pulse 62, temperature 36 C (96.8 F), temperature source Temporal, resp. rate 18, height 1.626 m (5\' 4" ), weight 63.821 kg (140 lb 11.2 oz), SpO2 94.00%.  General: NAD; truncal  tremor  Cardiovascular: Normal rate, regular rhythm   Pulmonary: CTA  Gastrointestinal: abd soft, NT, ND    Data:      Recent Labs  Lab 07/18/12  0541 07/17/12  0638 07/16/12  0521   WBC 7.6 9.6 10.9*   Hemoglobin 11.0* 11.7 12.0   Hematocrit 35 37 37   Platelets 159* 178 195     Other Labs:      Recent Labs  Lab 07/18/12  0541 07/17/12  0638 07/16/12  0521   Sodium 145 148* 140   Potassium 3.8 4.5 4.0   Chloride 116* 119* 107   CO2 22 17* 19*   UN 34* 66* 108*   Creatinine 0.88 1.16* 1.90*   GFR,Caucasian 68 49* 27*   GFR,Black 78 56* 31*   Glucose 87 108* 141*   Calcium 7.9* 7.8* 8.0*      X-rays the past 24 hours: US Abdomen Limited Single Quad Or F/u Specify    07/18/2012  IMPRESSION:   Hepatomegaly with fatty infiltration.   Cholelithiasis without additional evidence of biliary abnormality.   END OF REPORT          Phylliss Blakes, MD   07/19/2012   9:10 AM

## 2012-07-20 LAB — CBC AND DIFFERENTIAL
Baso # K/uL: 0 10*3/uL (ref 0.0–0.1)
Baso # K/uL: 0 10*3/uL (ref 0.0–0.1)
Baso # K/uL: 0 10*3/uL (ref 0.0–0.1)
Basophil %: 0 % (ref 0.1–1.2)
Basophil %: 0.2 % (ref 0.1–1.2)
Basophil %: 0.2 % (ref 0.1–1.2)
Eos # K/uL: 0 10*3/uL (ref 0.0–0.4)
Eos # K/uL: 0.1 10*3/uL (ref 0.0–0.4)
Eos # K/uL: 0.2 10*3/uL (ref 0.0–0.4)
Eosinophil %: 0 % — ABNORMAL LOW (ref 0.7–5.8)
Eosinophil %: 1.1 % (ref 0.7–5.8)
Eosinophil %: 1.3 % (ref 0.7–5.8)
Hematocrit: 33 % — ABNORMAL LOW (ref 34–45)
Hematocrit: 35 % (ref 34–45)
Hematocrit: 32 % — ABNORMAL LOW (ref 34–45)
Hemoglobin: 10.9 g/dL — ABNORMAL LOW (ref 11.2–15.7)
Hemoglobin: 11.8 g/dL (ref 11.2–15.7)
Hemoglobin: 10.8 g/dL — ABNORMAL LOW (ref 11.2–15.7)
Lymph # K/uL: 1 10*3/uL — ABNORMAL LOW (ref 1.2–3.7)
Lymph # K/uL: 2.2 10*3/uL (ref 1.2–3.7)
Lymph # K/uL: 2.5 10*3/uL (ref 1.2–3.7)
Lymphocyte %: 9 % — ABNORMAL LOW (ref 19.3–51.7)
Lymphocyte %: 20.3 % (ref 19.3–51.7)
Lymphocyte %: 22 % (ref 19.3–51.7)
MCV: 85 fL (ref 79–95)
MCV: 85 fL (ref 79–95)
MCV: 84 fL (ref 79–95)
Mono # K/uL: 0.4 10*3/uL (ref 0.2–0.9)
Mono # K/uL: 0.7 10*3/uL (ref 0.2–0.9)
Mono # K/uL: 1 10*3/uL — ABNORMAL HIGH (ref 0.2–0.9)
Monocyte %: 3 % — ABNORMAL LOW (ref 4.7–12.5)
Monocyte %: 6.7 % (ref 4.7–12.5)
Monocyte %: 8.7 % (ref 4.7–12.5)
Neut # K/uL: 10.1 10*3/uL — ABNORMAL HIGH (ref 1.6–6.1)
Neut # K/uL: 7.8 10*3/uL — ABNORMAL HIGH (ref 1.6–6.1)
Neut # K/uL: 7.5 10*3/uL — ABNORMAL HIGH (ref 1.6–6.1)
Nucl RBC # K/uL: 0 10*3/uL
Nucl RBC %: 0 /100 WBC (ref 0.0–0.2)
Platelets: 190 10*3/uL (ref 160–370)
Platelets: 198 10*3/uL (ref 160–370)
Platelets: 205 10*3/uL (ref 160–370)
RBC: 3.8 MIL/uL — ABNORMAL LOW (ref 3.9–5.2)
RBC: 4.1 MIL/uL (ref 3.9–5.2)
RBC: 3.8 MIL/uL — ABNORMAL LOW (ref 3.9–5.2)
RDW: 16.6 % — ABNORMAL HIGH (ref 11.7–14.4)
RDW: 16.5 % — ABNORMAL HIGH (ref 11.7–14.4)
RDW: 16.3 % — ABNORMAL HIGH (ref 11.7–14.4)
Seg Neut %: 88 % — ABNORMAL HIGH (ref 34.0–71.1)
Seg Neut %: 70.8 % (ref 34.0–71.1)
Seg Neut %: 66.9 % (ref 34.0–71.1)
WBC: 11.5 10*3/uL — ABNORMAL HIGH (ref 4.0–10.0)
WBC: 11 10*3/uL — ABNORMAL HIGH (ref 4.0–10.0)
WBC: 11.2 10*3/uL — ABNORMAL HIGH (ref 4.0–10.0)

## 2012-07-20 LAB — HYPOCHROMIA

## 2012-07-20 LAB — MANUAL DIFFERENTIAL

## 2012-07-20 LAB — MICROCYTIC

## 2012-07-20 LAB — SLIDE NUMBER: Slide # (Heme): 143

## 2012-07-20 LAB — TYPE AND SCREEN
ABO RH Blood Type: A POS
Antibody Screen: NEGATIVE

## 2012-07-20 LAB — TARGET CELLS

## 2012-07-20 LAB — BLOOD BANK HOLD LAVENDER

## 2012-07-20 LAB — DIFF BASED ON: Diff Based On: 100 CELLS

## 2012-07-20 MED ORDER — SODIUM CHLORIDE 0.9 % IV SOLN WRAPPED *I*
3.0000 mL/h | Status: DC
Start: 2012-07-20 — End: 2012-07-25

## 2012-07-20 NOTE — Progress Notes (Signed)
At 0945 PCT [name] came up to this writer and stated Michelle [name] had a large BM with bright red blood and clots. VSS. No complaints of pain. Pt A&Ox3. This writer notified Christianne Dolin, MD. Will continue to monitor.  Chrystine Oiler, RN

## 2012-07-20 NOTE — Progress Notes (Addendum)
Nurse reported a large BM with bright red blood today. She looks stable, no distress, denies any abdominal pain, nausea, vomiting. Her vital signs are stable. Last H&H on 6/6 was 11/35. CBC STAT done- a decrease on Hct by 2 points and Hgb looks stable. Will continue with CBC monitoring q 6 hrs, continue fluids, will transfuse for Hct <21. She might need a GI consult and a colonoscopy if continues with the bleed otherwise can follow with GI as outpatient. Unknown to me if she ever had colonoscopy.      Addendum; 13:27  Informed by the nurse of a second brown/red BM. Patient continues to be stable, no c/o chest pain, sob, dizziness. Vitals stable. Discussed with Dr Freida Busman. Noted patient with C.diff infection which could give bloody diarrhea. Will get type and screen, repeat H&H. Might need blood transfusion if Htc decreasing rapidly, GI called.

## 2012-07-20 NOTE — Progress Notes (Signed)
Inpatient Medicine Progress Note ([facility]) - LOS: 5    Assessment/Plan  1. cdiff colitis - on flagyl - improving - po as yet to improve   2. AKInjury nearly resolved. Continue maint IVF for now until taking adequate po.  3. Parkinsons - continue sinemet    Active Hospital Problems    Diagnosis   . AKI (acute kidney injury)      Resolved Hospital Problems    Diagnosis   No resolved problems to display.       DC Planning  Early this week    Interval History:  Slight nausea this AM. 2 stools yesterday. None so far today. Only took in 120cc yesterday.    Medications brief:  Scheduled Meds:  . metroNIDAZOLE  500 mg Oral Q8H SCH   . acetaminophen  1,000 mg Oral TID   . amiodarone  400 mg Oral Daily   . aspirin  81 mg Oral Daily   . atorvastatin  10 mg Oral Daily with dinner   . carbidopa-levodopa  1 tablet Oral BID   . cyanocobalamin  1,000 mcg Oral Daily   . sertraline  50 mg Oral Daily   . sodium chloride  5 mL Intravenous Q8H   . heparin (porcine)  5,000 Units Subcutaneous Q8H     Continuous Infusions:  . sodium chloride 75 mL/hr (07/19/12 2107)     PRN Meds:.ipratropium-albuterol, ondansetron, calcium carbonate    Medications given (24h):  acetaminophen (TYLENOL) tablet 1,000 mg    Date Action Dose Route User    07/19/2012 2108 Given 1000 mg Oral Vira Agar, RN    07/19/2012 1335 Given 1000 mg Oral Cheryln Manly, RN    07/19/2012 0960 Given 1000 mg Oral Cheryln Manly, RN      amiodarone (PACERONE) tablet 400 mg    Date Action Dose Route User    07/19/2012 0924 Given 400 mg Oral Cheryln Manly, RN      aspirin EC tablet 81 mg    Date Action Dose Route User    07/19/2012 0924 Given 81 mg Oral Cheryln Manly, RN      atorvastatin (LIPITOR) tablet 10 mg    Date Action Dose Route User    07/19/2012 1717 Given 10 mg Oral Cheryln Manly, RN      carbidopa-levodopa (SINEMET) 25-100 MG per tablet 1 tablet    Date Action Dose Route User    07/19/2012 1716 Given 1 tablet Oral Cheryln Manly, RN    07/19/2012 4540 Given 1 tablet Oral  Cheryln Manly, RN      heparin (porcine) SQ injection 5,000 Units    Date Action Dose Route User    07/20/2012 0111 Given 5000 Units Subcutaneous Vira Agar, RN    07/19/2012 1716 Given 5000 Units Subcutaneous Cheryln Manly, RN    07/19/2012 9811 Given 5000 Units Subcutaneous Cheryln Manly, RN      metroNIDAZOLE (FLAGYL) tablet 500 mg    Date Action Dose Route User    07/20/2012 0111 Given 500 mg Oral Vira Agar, RN    07/19/2012 1716 Given 500 mg Oral Cheryln Manly, RN    07/19/2012 9147 Given 500 mg Oral Cheryln Manly, RN      sertraline (ZOLOFT) tablet 50 mg    Date Action Dose Route User    07/19/2012 0924 Given 50 mg Oral Cheryln Manly, RN      Sodium Chloride 0.45% Infusion  Date Action Dose Route User    07/19/2012 2107 New Bag 75 mL/hr Intravenous Vira Agar, RN      sodium chloride 0.9 % flush 5 mL    Date Action Dose Route User    07/20/2012 0111 Given 5 mL Intravenous Vira Agar, RN    07/19/2012 1717 Given 5 mL Intravenous Cheryln Manly, RN    07/19/2012 0900 Given 5 mL Intravenous Cheryln Manly, RN      cyanocobalamin (Vitamin B-12) tablet 1,000 mcg    Date Action Dose Route User    07/19/2012 0924 Given 1000 mcg Oral Cheryln Manly, RN          Intake/Output    Intake/Output Summary (Last 24 hours) at 07/20/12 0829  Last data filed at 07/20/12 0618   Gross per 24 hour   Intake      0 ml   Output   2750 ml   Net  -2750 ml        Vital Signs Ranges:   Temp:  [35.7 C (96.3 F)-36.8 C (98.2 F)] 35.7 C (96.3 F)  Heart Rate:  [60-66] 60  Resp:  [16-18] 16  BP: (116-122)/(60-70) 122/64 mmHg     Physical Exam:  Blood pressure 122/64, pulse 60, temperature 35.7 C (96.3 F), temperature source Temporal, resp. rate 16, height 1.626 m (5\' 4" ), weight 63.821 kg (140 lb 11.2 oz), SpO2 94.00%.  General: NAD; baseline tremor  Cardiovascular: Normal rate, regular rhythm   Pulmonary: no increased work of breathing; CTA  Gastrointestinal: soft, NT    Data:      Recent Labs  Lab 07/18/12  0541  07/17/12  0638 07/16/12  0521   WBC 7.6 9.6 10.9*   Hemoglobin 11.0* 11.7 12.0   Hematocrit 35 37 37   Platelets 159* 178 195     Other Labs:      Recent Labs  Lab 07/18/12  0541 07/17/12  0638 07/16/12  0521   Sodium 145 148* 140   Potassium 3.8 4.5 4.0   Chloride 116* 119* 107   CO2 22 17* 19*   UN 34* 66* 108*   Creatinine 0.88 1.16* 1.90*   GFR,Caucasian 68 49* 27*   GFR,Black 78 56* 31*   Glucose 87 108* 141*   Calcium 7.9* 7.8* 8.0*      X-rays the past 24 hours: US Abdomen Limited Single Quad Or F/u Specify    07/18/2012  IMPRESSION:   Hepatomegaly with fatty infiltration.   Cholelithiasis without additional evidence of biliary abnormality.   END OF REPORT          Phylliss Blakes, MD   07/20/2012   8:29 AM

## 2012-07-20 NOTE — Progress Notes (Signed)
P: 2nd episode of bloody diarrhea this afternoon  A: PT's VSS, asymptomatic, stool brown/red. Dr Neta Ehlers notified. Hct drawn and came back stable.  R: Nursing has been monitoring pt closely. Dr Neta Ehlers saw pt when she was having her 3rd bloody stool, but nothing since. Pt resting comfortably.

## 2012-07-20 NOTE — Consults (Signed)
Gastroenterology Consult    Admit Date:  07/15/2012  Attending Provider:  Hessie Knows, MD                                  Primary Care Physician:  Fernand Parkins, MD   Admitting Diagnosis: C.difficile colitis    Consult reason: BRBPR    Subjective:      Chart reviewed. History obtained from patient and chart review    Michelle [name] is 67 y.o. year old female with a PMH significant for GERD, COPD, CAD, CVA, CHF, VT s/p ICD, Parkinson's disease and history of ETOH abuse admitted to [facility] on June 3rd for decreasing urine output, ATN, RLQ and right flank pain.  She underwent a CT A/P showing mild colitis of the descending colon in addition to diverticulosis.  The patient had been treated recently with Vantin x 14 days.  C.difficile PCR was positive.  The patient was initiated on flagyl 500mg  PO Q8hrs.  Today the patient was noted to have 2 bright red bloody bowel movements.  HCT 35-33-35.  +Leukocytosis.  Afebrile.    Patient reports improvement in her abdominal pain.  She denies nausea and vomiting.  She is tolerating a diet with pepsi and banana at bedside.  As per notes, PO intake has not improved.  Denies chills.  Unaware that she has bloody stool.  +yellow urine in foley bag.  Patient is unsure if she has undergone colonoscopy in the past.  She has talked about it but cannot recall if done.       Medications:   Home Medications:  Prior to Admission medications    Medication Sig Start Date End Date Taking? Authorizing Provider   cyanocobalamin (VITAMIN B-12) 1000 MCG tablet Take 1,000 mcg by mouth daily   Yes [provider]   aspirin 81 MG EC tablet Take 81 mg by mouth daily   Yes [provider]   carbidopa-levodopa (SINEMET) 25-100 MG per tablet Take 1 tablet by mouth 2 times daily   Yes [provider]   famotidine (PEPCID) 20 MG tablet Take 20 mg by mouth 2 times daily   Yes [provider]   Cholecalciferol (VITAMIN D3) 50000 UNITS CAPS Take 50,000 Units by  mouth every 28 days   Yes [provider]   carvedilol (COREG) 6.25 MG tablet Take 6.25 mg by mouth daily   Yes [provider]   amiodarone (PACERONE) 400 MG tablet Take 400 mg by mouth daily   Yes [provider]   sertraline (ZOLOFT) 50 MG tablet Take 50 mg by mouth daily   Yes [provider]   atorvastatin (LIPITOR) 10 MG tablet Take 10 mg by mouth daily (with dinner)   Yes [provider]   acetaminophen (TYLENOL) 325 MG tablet Take 650 mg by mouth every 6 hours as needed for Pain   Yes [provider]   HYDROcodone-acetaminophen (NORCO) 5-325 MG per tablet Take 1 tablet by mouth every 6 hours as needed for Pain   MDD  tablets   Yes [provider]   guaiFENesin (ROBITUSSIN) 100 MG/5ML syrup Take 200 mg by mouth 3 times daily as needed for Congestion   Yes [provider]   sorbitol 70 % solution Take 30 mLs by mouth daily as needed for Constipation   Yes [provider]   ipratropium-albuterol (DUONEB) 0.5-2.5 (3) MG/3ML nebulizer solution Take  3 mLs by nebulization 4 times daily as needed for Wheezing   Yes [provider]   carbidopa-levodopa (SINEMET) 25-100 MG per tablet Take 1 tablet by mouth 2 times daily    [provider]   carvedilol (COREG) 6.25 MG tablet Take 6.25 mg by mouth 2 times daily (with meals)    [provider]   amiodarone (PACERONE) 400 MG tablet Take 400 mg by mouth daily    [provider]   aspirin 81 MG EC tablet Take 81 mg by mouth daily    [provider]   atorvastatin (LIPITOR) 10 MG tablet Take 10 mg by mouth daily (with dinner)    [provider]   famotidine (PEPCID) 20 MG tablet Take 20 mg by mouth daily    [provider]   sertraline (ZOLOFT) 50 MG tablet Take 50 mg by mouth daily    [provider]   cyanocobalamin (VITAMIN B-12) 1000 MCG tablet Take 1,000 mcg by mouth daily    [provider]   calcium  carbonate-vitamin D 600-400 MG-UNIT per tablet Take 1 tablet by mouth 2 times daily    [provider]   bisacodyl (DULCOLAX) 10 MG suppository Place 10 mg rectally daily as needed    [provider]   Acetaminophen (APAP) 325 MG tablet Take 650 mg by mouth every 6 hours as needed    [provider]   sorbitol 70 % solution Take 30 mLs by mouth daily as needed    [provider]     Hospital Medications:  . metroNIDAZOLE  500 mg Oral Q8H SCH   . acetaminophen  1,000 mg Oral TID   . amiodarone  400 mg Oral Daily   . aspirin  81 mg Oral Daily   . atorvastatin  10 mg Oral Daily with dinner   . carbidopa-levodopa  1 tablet Oral BID   . cyanocobalamin  1,000 mcg Oral Daily   . sertraline  50 mg Oral Daily   . sodium chloride  5 mL Intravenous Q8H   . heparin (porcine)  5,000 Units Subcutaneous Q8H     Family history:   No family history on file.  PMH:     Past Medical History   Diagnosis Date   . CVA (cerebral infarction) 10/23/2011   . ASHD (arteriosclerotic heart disease) 10/23/2011   . MI (mitral incompetence) 10/23/2011   . CHF (congestive heart failure) 10/23/2011   . COPD (chronic obstructive pulmonary disease) 10/23/2011   . ETOH abuse 10/23/2011   . VT (ventricular tachycardia) 10/23/2011     S/p AICD   . Anemia of chronic disease 10/23/2011   . HTN (hypertension) 10/23/2011   . Parkinsonism    . Unspecified cerebral artery occlusion with cerebral infarction    . Coronary artery disease    . CHF (congestive heart failure)    . COPD (chronic obstructive pulmonary disease)    . AICD (automatic cardioverter/defibrillator) present    . Hypertension    . Myocardial infarction june 2013   . CHF (congestive heart failure)      EF 30%   . Alcohol abuse, in remission    . Anemia    . HTN (hypertension)    . Ventricular tachycardia     Past Surgical History   Procedure Laterality Date   . Icd     . Hemicolectomy Left    . Cardiac defibrillator placement     . Pacemaker insertion  Social  history:     History   Substance Use Topics   . Smoking status: Not on file   . Smokeless tobacco: Not on file   . Alcohol Use: No      Comment: formerly, heavy drinker     Allergies:   No Known Allergies (drug, envir, food or latex)  Review of Systems:   Gen: no fevers/chills, no weight loss/gain  HEENT: no sore throat, no tinnitis  Cardiac: no CP, palpitations  Pulm: no SOB, cough  Abd: as per HPI  GU: no hematuria, +decrease in urine frequency  MSK: no joint pain, myalgias  Skin: no rashes, jaundice  Hematology: no bruising/bleeding  Endocrine: no heat/cold intolerance  Neuro: no headache, focal weakness  Psychiatric: no somnolence, confusion  PHYSICAL EXAM:  Temp Readings from Last 3 Encounters:   07/20/12 36.3 C (97.4 F) Oral   12/18/11 36.2 C (97.1 F)    10/24/11 35.9 C (96.7 F)      BP Readings from Last 3 Encounters:   07/20/12 126/64   12/18/11 102/60   10/23/11 102/60     Pulse Readings from Last 3 Encounters:   07/20/12 60   12/18/11 62   10/23/11 62       Patient Vitals for the past 72 hrs:   BP Temp Temp src Pulse Resp SpO2   07/20/12 1346 126/64 mmHg 36.3 C (97.4 F) Oral 60 18 95 %   07/20/12 1010 - 35.9 C (96.6 F) TEMPORAL 63 - 94 %   07/20/12 1003 132/60 mmHg - - - 18 -   07/20/12 0618 122/64 mmHg 35.7 C (96.3 F) TEMPORAL 60 16 94 %   07/19/12 2146 118/70 mmHg 36.8 C (98.2 F) - 66 18 96 %   07/19/12 1434 116/60 mmHg 36.1 C (97 F) TEMPORAL 60 18 94 %   07/19/12 0600 110/62 mmHg 36 C (96.8 F) - 62 18 94 %   07/18/12 2119 108/58 mmHg 36 C (96.8 F) - 60 18 94 %   07/18/12 1713 119/50 mmHg 36.2 C (97.2 F) TEMPORAL 60 18 96 %   07/18/12 1501 116/58 mmHg 36 C (96.8 F) TEMPORAL 61 18 95 %   07/18/12 0932 118/48 mmHg 36.3 C (97.3 F) TEMPORAL 60 18 94 %   07/18/12 0623 104/66 mmHg 36.9 C (98.4 F) - 60 18 98 %   07/18/12 0230 100/50 mmHg 35.8 C (96.4 F) TEMPORAL 60 16 97 %   07/17/12 2108 98/60 mmHg 36.9 C (98.4 F) TEMPORAL 59 16 95 %     Wt Readings from Last 3 Encounters:    07/15/12 63.821 kg (140 lb 11.2 oz)   12/18/11 53.524 kg (118 lb)   10/23/11 48.535 kg (107 lb)     I/O last 3 completed shifts:  06/07 1500 - 06/08 1459  In: 945 (14.8 mL/kg) [P.O.:420; I.V.:525 (0.3 mL/kg/hr)]  Out: 2250 (35.3 mL/kg) [Urine:2250 (1.5 mL/kg/hr)]  Net: -1305  Weight used: 63.8 kg  General appearance: alert, appears stated age and cooperative, resting tremor, difficulty articulating  Head: Normocephalic, without obvious abnormality, atraumatic  Eyes: conjunctivae/corneas clear.   Neck: supple, symmetrical, trachea midline  Lungs: clear to auscultation bilaterally anteriorly  Heart: regular rate and rhythm, S1, S2 normal, no murmur, click, rub or gallop  Abdomen: soft, non-tender; bowel sounds normal; no masses,  no organomegaly  Extremities: extremities normal, atraumatic, no cyanosis or edema    LAB DATA:     Lab Results:    Recent  Labs  Lab 07/20/12  1349 07/20/12  1034 07/18/12  0541   WBC 11.0* 11.5* 7.6   Hemoglobin 11.8 10.9* 11.0*   Hematocrit 35 33* 35   MCV 85 85 89   Platelets 198 190 159*       Recent Labs  Lab 07/18/12  0541 07/17/12  0638 07/16/12  0521   Sodium 145 148* 140   Potassium 3.8 4.5 4.0   Chloride 116* 119* 107   CO2 22 17* 19*     No results found for this basename: ALKPHOS, BILITOT, BILIDIR, PROT, LABALBU, ALT, AST,  in the last 168 hours   No components found with this basename: LIPASE, AMYLASE,    No results found for this basename: SEDRATE, CRP,  in the last 168 hours   IMAGING:   US Abdomen Limited Single Quad Or F/u Specify    07/18/2012  IMPRESSION:   Hepatomegaly with fatty infiltration.   Cholelithiasis without additional evidence of biliary abnormality.   END OF REPORT       ASSESSMENT:     67 y.o. year old female with multiple medical problems including GERD, fatty liver and cholelithiasis admitted with abdominal pain and ATN in the setting of a CT A/P showing descending colon colitis and C.difficile PCR positive.  Now with hematochezia however stable HCT,  improving renal function and improving abdominal pain.    PLAN:     1. Continue to document stool output.  Trend HCT and transfuse as needed.  2. Continue Flagyl PO as scheduled.  3. If abdominal pain increases or leukocytosis progresses would suggest urgent CT scan.   4. Will continue to follow closely at this time.  Patient does not wish to pursue colonoscopy at this time.  Will attempt to reach family to discuss this procedure if bleeding persists.  Patient will need to temporarily change code status for colonoscopy if she and her family wish to proceed.    Author: Beckie Busing. Victorio Palm, M.D.   Note created: 07/20/2012  at: 5:22 PM   Cell # [phone]

## 2012-07-21 LAB — CBC AND DIFFERENTIAL
Baso # K/uL: 0 10*3/uL (ref 0.0–0.1)
Baso # K/uL: 0.1 10*3/uL (ref 0.0–0.1)
Baso # K/uL: 0 10*3/uL (ref 0.0–0.1)
Basophil %: 0.1 % (ref 0.1–1.2)
Basophil %: 1 % (ref 0.1–1.2)
Basophil %: 0 % (ref 0.1–1.2)
Eos # K/uL: 0.2 10*3/uL (ref 0.0–0.4)
Eos # K/uL: 0.1 10*3/uL (ref 0.0–0.4)
Eos # K/uL: 0.3 10*3/uL (ref 0.0–0.4)
Eosinophil %: 1.9 % (ref 0.7–5.8)
Eosinophil %: 1 % (ref 0.7–5.8)
Eosinophil %: 2 % (ref 0.7–5.8)
Hematocrit: 32 % — ABNORMAL LOW (ref 34–45)
Hematocrit: 31 % — ABNORMAL LOW (ref 34–45)
Hematocrit: 32 % — ABNORMAL LOW (ref 34–45)
Hemoglobin: 10.7 g/dL — ABNORMAL LOW (ref 11.2–15.7)
Hemoglobin: 10.4 g/dL — ABNORMAL LOW (ref 11.2–15.7)
Hemoglobin: 10.7 g/dL — ABNORMAL LOW (ref 11.2–15.7)
Lymph # K/uL: 2.5 10*3/uL (ref 1.2–3.7)
Lymph # K/uL: 1.3 10*3/uL (ref 1.2–3.7)
Lymph # K/uL: 2 10*3/uL (ref 1.2–3.7)
Lymphocyte %: 22.5 % (ref 19.3–51.7)
Lymphocyte %: 9 % — ABNORMAL LOW (ref 19.3–51.7)
Lymphocyte %: 15 % — ABNORMAL LOW (ref 19.3–51.7)
MCV: 84 fL (ref 79–95)
MCV: 84 fL (ref 79–95)
MCV: 83 fL (ref 79–95)
Mono # K/uL: 1.1 10*3/uL — ABNORMAL HIGH (ref 0.2–0.9)
Mono # K/uL: 0.3 10*3/uL (ref 0.2–0.9)
Mono # K/uL: 0.6 10*3/uL (ref 0.2–0.9)
Monocyte %: 10.2 % (ref 4.7–12.5)
Monocyte %: 2 % — ABNORMAL LOW (ref 4.7–12.5)
Monocyte %: 5 % (ref 4.7–12.5)
Neut # K/uL: 7.1 10*3/uL — ABNORMAL HIGH (ref 1.6–6.1)
Neut # K/uL: 11.2 10*3/uL — ABNORMAL HIGH (ref 1.6–6.1)
Neut # K/uL: 9.7 10*3/uL — ABNORMAL HIGH (ref 1.6–6.1)
Nucl RBC # K/uL: 0.1 10*3/uL
Nucl RBC # K/uL: 0.1 10*3/uL
Nucl RBC %: 1 /100 WBC — ABNORMAL HIGH (ref 0.0–0.2)
Nucl RBC %: 1 /100 WBC — ABNORMAL HIGH (ref 0.0–0.2)
Platelets: 197 10*3/uL (ref 160–370)
Platelets: 207 10*3/uL (ref 160–370)
Platelets: 234 10*3/uL (ref 160–370)
RBC: 3.8 MIL/uL — ABNORMAL LOW (ref 3.9–5.2)
RBC: 3.7 MIL/uL — ABNORMAL LOW (ref 3.9–5.2)
RBC: 3.8 MIL/uL — ABNORMAL LOW (ref 3.9–5.2)
RDW: 16.3 % — ABNORMAL HIGH (ref 11.7–14.4)
RDW: 16.5 % — ABNORMAL HIGH (ref 11.7–14.4)
RDW: 16.3 % — ABNORMAL HIGH (ref 11.7–14.4)
Seg Neut %: 64.7 % (ref 34.0–71.1)
Seg Neut %: 86 % — ABNORMAL HIGH (ref 34.0–71.1)
Seg Neut %: 76 % — ABNORMAL HIGH (ref 34.0–71.1)
WBC: 11.1 10*3/uL — ABNORMAL HIGH (ref 4.0–10.0)
WBC: 13 10*3/uL — ABNORMAL HIGH (ref 4.0–10.0)
WBC: 12.6 10*3/uL — ABNORMAL HIGH (ref 4.0–10.0)

## 2012-07-21 LAB — EKG 12-LEAD
P: 67 degrees
QRS: -90 degrees
Rate: 60 {beats}/min
Severity: ABNORMAL
Severity: ABNORMAL
T: 86 degrees

## 2012-07-21 LAB — MANUAL DIFFERENTIAL

## 2012-07-21 LAB — REACTIVE LYMPHS
React Lymph %: 1 % (ref 0.0–6.0)
React Lymph %: 1 % (ref 0.0–6.0)

## 2012-07-21 LAB — ANISOCYTOSIS

## 2012-07-21 LAB — BANDS: Bands %: 1 % (ref 0–10)

## 2012-07-21 LAB — SLIDE NUMBER
Slide # (Heme): 169
Slide # (Heme): 177

## 2012-07-21 LAB — DIFF BASED ON
Diff Based On: 100 CELLS
Diff Based On: 100 CELLS

## 2012-07-21 NOTE — Progress Notes (Signed)
Inpatient Medicine Progress Note    Interval History:  Pt without complaints. She denies any abd pain.  No nasuea  Po intake still not great  - looks like 25-50% of meals  Still with loose stools, but no blood reported since yesterday     Intake/Output    Intake/Output Summary (Last 24 hours) at 07/21/12 1009  Last data filed at 07/21/12 0608   Gross per 24 hour   Intake   2165 ml   Output   1725 ml   Net    440 ml        Vital Signs:   Temp:  [35.9 C (96.6 F)-36.5 C (97.7 F)] 36.5 C (97.7 F)  Heart Rate:  [60-67] 64  Resp:  [18] 18  BP: (126-140)/(50-64) 140/64 mmHg       Physical Exam:    General:  Nad, tremor noted  HEENT:  mmm  Cardiovascular:  Reg rhythm, normal rate  Pulmonary:  Lungs clear  Gastrointestinal:  Soft, NT, ND, no rebound, good bowel sounds  Skin:     Data:      Recent Labs  Lab 07/21/12  0620 07/21/12  0153 07/20/12  1929   WBC 13.0* 11.1* 11.2*   Hemoglobin 10.4* 10.7* 10.8*   Hematocrit 31* 32* 32*   Platelets 207 197 205     Other Labs:      Recent Labs  Lab 07/18/12  0541 07/17/12  0638 07/16/12  0521   Sodium 145 148* 140   Potassium 3.8 4.5 4.0   Chloride 116* 119* 107   CO2 22 17* 19*   UN 34* 66* 108*   Creatinine 0.88 1.16* 1.90*   GFR,Caucasian 68 49* 27*   GFR,Black 78 56* 31*   Glucose 87 108* 141*   Calcium 7.9* 7.8* 8.0*      X-rays the past 24 hours: US Abdomen Limited Single Quad Or F/u Specify    07/18/2012  IMPRESSION:   Hepatomegaly with fatty infiltration.   Cholelithiasis without additional evidence of biliary abnormality.   END OF REPORT        Assessment/Plan  Active Hospital Problems    Diagnosis   . AKI (acute kidney injury)      Resolved Hospital Problems    Diagnosis   No resolved problems to display.     C.diff colitis -- likely cuase of bleeding as well. Improving  --continue po flagyl  --follow stool output  --continue clears for now    Gi bleed - likley lower due to c.diff.  APpreciate GI input.  hemodymamically stable and hct  stable  --continue to follow hct  --monitor stool output  --hold on colonoscopy for now  --hold heparin for now  --ok to continue asa for now - stop if has ongoing bleeding    AKI -- due to dehydration? Improved with ivf  --will stop ivf today     Parkinsons -- on sinemet      DC Planning  Back to snf in 1-3 more days likely    Renelda Mom, MD   07/21/2012   10:09 AM     Inpatient checklist  Have Advanced Directives been addressed? Is MOLST in chart and order placed?  Are daily labs needed?  Verify activity orders/encourage mobilization  Remove unnecessary lines (IVs, O2, catheters)  Medication reconcilliation

## 2012-07-21 NOTE — Progress Notes (Signed)
Gastroenterology Progress Note    Subjective:  Patient denies abdominal pain.  Tells me she had one bowel movement today.  Afebrile.  HCT 32-32-31.  No nausea, no vomiting.  Tolerating minimal clear liquid intake as per notes.      Hospital Medications:  . metroNIDAZOLE  500 mg Oral Q8H SCH   . acetaminophen  1,000 mg Oral TID   . amiodarone  400 mg Oral Daily   . aspirin  81 mg Oral Daily   . atorvastatin  10 mg Oral Daily with dinner   . carbidopa-levodopa  1 tablet Oral BID   . cyanocobalamin  1,000 mcg Oral Daily   . sertraline  50 mg Oral Daily   . sodium chloride  5 mL Intravenous Q8H       Objective:  Vital signs in last 24 hours:  BP: (132-140)/(50-64)   Temp:  [35.8 C (96.5 F)-36.5 C (97.7 F)]   Temp src:  [-]   Heart Rate:  [64-67]   Resp:  [18]   SpO2:  [93 %-100 %]     Intake/Output Summary (Last 24 hours) at 07/21/12 1612  Last data filed at 07/21/12 1346   Gross per 24 hour   Intake   1460 ml   Output   1675 ml   Net   -215 ml      General appearance: alert, appears stated age and cooperative, difficulty articulating  HEENT:  EOMI, anicteric sclera, MMM  Lungs: clear to auscultation bilaterally  Heart: regular rate and rhythm, S1, S2 normal, no murmur, click, rub or gallop  Abdomen: soft, non-tender; bowel sounds normal; no masses,  no organomegaly  Extremities: extremities normal, atraumatic, no cyanosis or edema  Neurologic: Grossly normal    Lab Results:    Recent Labs  Lab 07/21/12  0620 07/21/12  0153 07/20/12  1929   WBC 13.0* 11.1* 11.2*   Hemoglobin 10.4* 10.7* 10.8*   MCV 84 84 84   Hematocrit 31* 32* 32*   Platelets 207 197 205            Recent Labs  Lab 07/18/12  0541 07/17/12  0638 07/16/12  0521   Sodium 145 148* 140   Potassium 3.8 4.5 4.0   Chloride 116* 119* 107   CO2 22 17* 19*   UN 34* 66* 108*   Creatinine 0.88 1.16* 1.90*   Glucose 87 108* 141*   Calcium 7.9* 7.8* 8.0*          Lab results: 07/09/12  0705 07/06/12  1555 07/04/12  0620 07/02/12  0625 06/30/12  0645   01/07/12  0620   Total Protein 7.7 7.8* 7.3 7.4 7.6  < > 7.2   Albumin 3.8 3.9 3.7 3.8 4.0  < > 3.9   ALT 61* 28 32 33 55*  < > 56*   AST 89* 85* 54* 44* 41*  < > 43*   Alk Phos 117* 111* 100 104 120*  < > 82   Bilirubin,Total 0.6 0.6 0.5 0.7 0.7  < > 0.3   Bilirubin,Direct  --   --  0.3 0.4* 0.3  --  <0.2   < > = values in this interval not displayed.    Lab Results   Component Value Date    ALK 117* 07/09/2012     No results found for this basename: HTBIL, TB, DB,  in the last 168 hours    No results found for this basename: INR,  in the  last 168 hours    Lab Results   Component Value Date    LIP 39 07/06/2012       Assessment: 67 y.o. year old female with multiple medical problems including GERD, fatty liver and cholelithiasis admitted with abdominal pain and ATN in the setting of a CT A/P showing descending colon colitis and C.difficile PCR positive. Now with hematochezia however stable HCT, improving renal function and improving abdominal pain.       Plan:  1. Continue to document stool output. Trend HCT and transfuse as needed.   2. Continue Flagyl PO as scheduled for a total of 14 days.   3. Will sign off, please call if abdominal pain worsens or rectal bleeding persists.      Author:  Beckie Busing. Victorio Palm, M.D.  Note created: 07/21/2012  at: 4:12 PM   Cell#:  [phone]

## 2012-07-22 LAB — CBC AND DIFFERENTIAL
Baso # K/uL: 0 10*3/uL (ref 0.0–0.1)
Baso # K/uL: 0 10*3/uL (ref 0.0–0.1)
Basophil %: 0.2 % (ref 0.1–1.2)
Basophil %: 0 % (ref 0.1–1.2)
Eos # K/uL: 0.1 10*3/uL (ref 0.0–0.4)
Eos # K/uL: 0 10*3/uL (ref 0.0–0.4)
Eosinophil %: 0.9 % (ref 0.7–5.8)
Eosinophil %: 0 % — ABNORMAL LOW (ref 0.7–5.8)
Hematocrit: 32 % — ABNORMAL LOW (ref 34–45)
Hematocrit: 32 % — ABNORMAL LOW (ref 34–45)
Hemoglobin: 10.8 g/dL — ABNORMAL LOW (ref 11.2–15.7)
Hemoglobin: 11.1 g/dL — ABNORMAL LOW (ref 11.2–15.7)
Lymph # K/uL: 2.4 10*3/uL (ref 1.2–3.7)
Lymph # K/uL: 2.4 10*3/uL (ref 1.2–3.7)
Lymphocyte %: 18 % — ABNORMAL LOW (ref 19.3–51.7)
Lymphocyte %: 16 % — ABNORMAL LOW (ref 19.3–51.7)
MCV: 83 fL (ref 79–95)
MCV: 83 fL (ref 79–95)
Mono # K/uL: 1.1 10*3/uL — ABNORMAL HIGH (ref 0.2–0.9)
Mono # K/uL: 1.2 10*3/uL — ABNORMAL HIGH (ref 0.2–0.9)
Monocyte %: 8.6 % (ref 4.7–12.5)
Monocyte %: 10 % (ref 4.7–12.5)
Neut # K/uL: 9.5 10*3/uL — ABNORMAL HIGH (ref 1.6–6.1)
Neut # K/uL: 8.8 10*3/uL — ABNORMAL HIGH (ref 1.6–6.1)
Nucl RBC # K/uL: 0.1 10*3/uL
Nucl RBC %: 1 /100 WBC — ABNORMAL HIGH (ref 0.0–0.2)
Platelets: 235 10*3/uL (ref 160–370)
Platelets: 258 10*3/uL (ref 160–370)
RBC: 3.8 MIL/uL — ABNORMAL LOW (ref 3.9–5.2)
RBC: 3.9 MIL/uL (ref 3.9–5.2)
RDW: 16.3 % — ABNORMAL HIGH (ref 11.7–14.4)
RDW: 16.2 % — ABNORMAL HIGH (ref 11.7–14.4)
Seg Neut %: 71.1 % (ref 34.0–71.1)
Seg Neut %: 68 % (ref 34.0–71.1)
WBC: 13.3 10*3/uL — ABNORMAL HIGH (ref 4.0–10.0)
WBC: 12.4 10*3/uL — ABNORMAL HIGH (ref 4.0–10.0)

## 2012-07-22 LAB — SLIDE NUMBER: Slide # (Heme): 215

## 2012-07-22 LAB — BASIC METABOLIC PANEL
Anion Gap: 9 (ref 7–16)
CO2: 26 mmol/L (ref 20–28)
Calcium: 7.5 mg/dL — ABNORMAL LOW (ref 8.6–10.2)
Chloride: 104 mmol/L (ref 96–108)
Creatinine: 0.53 mg/dL (ref 0.51–0.95)
GFR,Black: 113 *
GFR,Caucasian: 98 *
Glucose: 115 mg/dL — ABNORMAL HIGH (ref 60–99)
Lab: 3 mg/dL — ABNORMAL LOW (ref 6–20)
Potassium: 2.9 mmol/L — ABNORMAL LOW (ref 3.3–5.1)
Sodium: 139 mmol/L (ref 133–145)

## 2012-07-22 LAB — DIFF BASED ON: Diff Based On: 100 CELLS

## 2012-07-22 LAB — BANDS: Bands %: 3 % (ref 0–10)

## 2012-07-22 LAB — MANUAL DIFFERENTIAL

## 2012-07-22 LAB — RED BLOOD CELLS

## 2012-07-22 LAB — TARGET CELLS

## 2012-07-22 LAB — REACTIVE LYMPHS: React Lymph %: 3 % (ref 0.0–6.0)

## 2012-07-22 MED ORDER — POTASSIUM CHLORIDE CRYS CR 20 MEQ PO TBCR *I*
40.0000 meq | ORAL_TABLET | Freq: Two times a day (BID) | ORAL | Status: AC
Start: 2012-07-22 — End: 2012-07-22
  Administered 2012-07-22 (×2): 40 meq via ORAL
  Filled 2012-07-22 (×2): qty 2

## 2012-07-22 NOTE — Progress Notes (Signed)
Inpatient Medicine Progress Note    Interval History:  Feeling better.  More hungry today.  No abd pain  Had soft stool this am and loose last night, but decreased overall  No chest pain, no sob    Intake/Output    Intake/Output Summary (Last 24 hours) at 07/22/12 1034  Last data filed at 07/22/12 0847   Gross per 24 hour   Intake    240 ml   Output   1100 ml   Net   -860 ml        Vital Signs:   Temp:  [35.8 C (96.5 F)-37 C (98.6 F)] 36.4 C (97.6 F)  Heart Rate:  [65-74] 74  Resp:  [18-20] 18  BP: (120-144)/(56-62) 122/60 mmHg       Physical Exam:    General:  nad   HEENT:  mmm  Cardiovascular:  Reg rhythm, normal rate  Pulmonary:  Lungs clear  Gastrointestinal:  Soft, NT, good bowel sounds  Skin:     Data:      Recent Labs  Lab 07/22/12  0853 07/21/12  1806 07/21/12  0620   WBC 13.3* 12.6* 13.0*   Hemoglobin 10.8* 10.7* 10.4*   Hematocrit 32* 32* 31*   Platelets 235 234 207     Other Labs:      Recent Labs  Lab 07/22/12  0852 07/18/12  0541 07/17/12  0638   Sodium 139 145 148*   Potassium 2.9* 3.8 4.5   Chloride 104 116* 119*   CO2 26 22 17*   UN 3* 34* 66*   Creatinine 0.53 0.88 1.16*   GFR,Caucasian 98 68 49*   GFR,Black 113 78 56*   Glucose 115* 87 108*   Calcium 7.5* 7.9* 7.8*      X-rays the past 24 hours: No results found.     Assessment/Plan  Active Hospital Problems    Diagnosis   . AKI (acute kidney injury)      Resolved Hospital Problems    Diagnosis   No resolved problems to display.       C.diff -- improving  --continue po flagyl - plan for 14 days total    Rectal bleeding - probalby due to infectious colitis - resolved?  hct stable  --holding lmwh  --ok to continue asa  --repeat hct tomorrow    aki - improved    Hypokalemia -- due to diarrhea?  --will give 40 meq po x2 today  --repeat labs in am    Advance diet today    DC Planning  Back to snf tomorrow if good po intake and otherwise stable     Renelda Mom, MD   07/22/2012   10:34 AM     Inpatient checklist  Have  Advanced Directives been addressed? Is MOLST in chart and order placed?  Are daily labs needed?  Verify activity orders/encourage mobilization  Remove unnecessary lines (IVs, O2, catheters)  Medication reconcilliation

## 2012-07-22 NOTE — Plan of Care (Signed)
Problem: Inadequate oral intake  Goal: Nutritional status maintained or improved  Outcome: Progressing towards goal  Intervention: Meal set up/Assist with meal  Pt requires assistance with set up at meals.  Encourage intake of food and fluids.  Intervention: Diet modifications  Geriatric diet as tolerated.  Pt has some difficulty with chewing but denies the need for mechanically altered foods.  Service will monitor tolerance of regular consistency.          Comments:                                       DIET TECHNICIAN INITIAL NUTRITION EVALUATION        Last weight   07/15/12 63.821 kg (140 lb 11.2 oz)         Nutrition Risk Level low  Pt watching the television with plate in front of her. Grilled cheese sandwich. States she is eating well, no complaints.  67 y/o F with MMP Hx of CVA, CAD, CHF, VT s/p ICD with pacemaker, Htn, presenting from NH with AKI and oliguria.  Diet PTA was no added salt with tray set up. No food allergies. No indication of significant weight changes.  Pt with moderate nutrition risk related to inadequate intake as evidenced by facility I+O record that show 25- 50% intake.     Based on the above information: Nutrition intervention as follows:  Geriatric diet. Monitor tolerance of regular consistency.  Assist with tray set up  Encourage intake.  Melvern Sample, DTR

## 2012-07-23 LAB — CBC AND DIFFERENTIAL
Baso # K/uL: 0 10*3/uL (ref 0.0–0.1)
Basophil %: 0 % (ref 0.1–1.2)
Eos # K/uL: 0.1 10*3/uL (ref 0.0–0.4)
Eosinophil %: 1 % (ref 0.7–5.8)
Hematocrit: 29 % — ABNORMAL LOW (ref 34–45)
Hemoglobin: 10.2 g/dL — ABNORMAL LOW (ref 11.2–15.7)
Lymph # K/uL: 1.6 10*3/uL (ref 1.2–3.7)
Lymphocyte %: 11 % — ABNORMAL LOW (ref 19.3–51.7)
MCV: 83 fL (ref 79–95)
Mono # K/uL: 0.9 10*3/uL (ref 0.2–0.9)
Monocyte %: 6 % (ref 4.7–12.5)
Neut # K/uL: 11.6 10*3/uL — ABNORMAL HIGH (ref 1.6–6.1)
Nucl RBC # K/uL: 0.1 10*3/uL
Nucl RBC %: 1 /100 WBC — ABNORMAL HIGH (ref 0.0–0.2)
Platelets: 245 10*3/uL (ref 160–370)
RBC: 3.5 MIL/uL — ABNORMAL LOW (ref 3.9–5.2)
RDW: 16.4 % — ABNORMAL HIGH (ref 11.7–14.4)
Seg Neut %: 81 % — ABNORMAL HIGH (ref 34.0–71.1)
WBC: 14.1 10*3/uL — ABNORMAL HIGH (ref 4.0–10.0)

## 2012-07-23 LAB — GIANT PLATELETS

## 2012-07-23 LAB — BASIC METABOLIC PANEL
Anion Gap: 10 (ref 7–16)
CO2: 25 mmol/L (ref 20–28)
Calcium: 7.6 mg/dL — ABNORMAL LOW (ref 8.6–10.2)
Chloride: 106 mmol/L (ref 96–108)
Creatinine: 0.59 mg/dL (ref 0.51–0.95)
GFR,Black: 109 *
GFR,Caucasian: 95 *
Glucose: 114 mg/dL — ABNORMAL HIGH (ref 60–99)
Lab: 4 mg/dL — ABNORMAL LOW (ref 6–20)
Potassium: 4.1 mmol/L (ref 3.3–5.1)
Sodium: 141 mmol/L (ref 133–145)

## 2012-07-23 LAB — POCT GLUCOSE
Glucose POCT: 116 mg/dL — ABNORMAL HIGH (ref 60–99)
Glucose POCT: 104 mg/dL — ABNORMAL HIGH (ref 60–99)

## 2012-07-23 LAB — SLIDE NUMBER: Slide # (Heme): 239

## 2012-07-23 LAB — BANDS: Bands %: 1 % (ref 0–10)

## 2012-07-23 LAB — TARGET CELLS

## 2012-07-23 LAB — DIFF BASED ON: Diff Based On: 100 CELLS

## 2012-07-23 LAB — MANUAL DIFFERENTIAL

## 2012-07-23 LAB — VACUOLATED SEGS

## 2012-07-23 NOTE — Progress Notes (Addendum)
SW was informed by MD that pt is likely stable for discharge back to SNF today. SW left message for placement office to arrange transport through [facility] for 1:30pm to [place]. Social work will continue to follow for discharge planning as needed.    Lovett Sox, LMSW  07/23/2012  8:58 AM     Addendum: Pt is set up for 1:30pm discharge. Nurse and PA are aware.   Nurse report number is [phone]    Addendum: SW spoke with MD who stated that pt is not medically stable for discharge today. SW notified placement office to cancel transport.   Lovett Sox, LMSW  07/23/2012  12:01 PM

## 2012-07-23 NOTE — Consults (Addendum)
Neurosurgery PA Consult Note  For Dr. Mordecai Maes     CC: Low back pain     HPI: Michelle [name] is a 67 y.o. female with a PMHx significant for CVA, CAD, CHF, VT s/p pacemaker who presents from her nursing home with AKI and oliguria 8 days ago. Recently treated w/ vantin for UTI. Intake has been poor and pt has been on the sleepy side. Today, pt has been c/o R sided abdominal pain. Denies fall, fever, chills, sob, cough, dysuria, N/V/D per pt though pt's details quite limited. Labs drawn yesterday w/ a BUN of 155 and Cr 3. Renal ultrasound showed no hydronephrosis or obstruction. Over the last 2 days she has been complaining of low back pain. Patient is reported to be a poor historian but states that the pain started about 2 weeks ago w/o any reported falls or trauma. She denies any numbness, tingling, pain in legs  or increased weakness. She doesn't walk, only transfers to wheel chair to bed.     Meds:  Current Facility-Administered Medications   Medication Dose Route Frequency   . sodium chloride 0.9 % IV  3 mL/hr Intravenous Continuous   . sodium chloride 0.9 % IV  3 mL/hr Intravenous Continuous   . metroNIDAZOLE (FLAGYL) tablet 500 mg  500 mg Oral Q8H SCH   . acetaminophen (TYLENOL) tablet 1,000 mg  1,000 mg Oral TID   . amiodarone (PACERONE) tablet 400 mg  400 mg Oral Daily   . aspirin EC tablet 81 mg  81 mg Oral Daily   . atorvastatin (LIPITOR) tablet 10 mg  10 mg Oral Daily with dinner   . carbidopa-levodopa (SINEMET) 25-100 MG per tablet 1 tablet  1 tablet Oral BID   . cyanocobalamin (Vitamin B-12) tablet 1,000 mcg  1,000 mcg Oral Daily   . ipratropium-albuterol (DUONEB) 0.5-2.5 (3) MG/3ML nebulization solution 3 mL  3 mL Nebulization 4x Daily PRN   . sertraline (ZOLOFT) tablet 50 mg  50 mg Oral Daily   . sodium chloride 0.9 % flush 5 mL  5 mL Intravenous Q8H   . ondansetron (ZOFRAN) injection 4 mg  4 mg Intravenous Q6H PRN   . calcium carbonate (TUMS) chewable tablet 1,000 mg  1,000 mg Oral Q8H PRN        Allergies:  No Known Allergies (drug, envir, food or latex)    Past Medical History:  Past Medical History   Diagnosis Date   . CVA (cerebral infarction) 10/23/2011   . ASHD (arteriosclerotic heart disease) 10/23/2011   . MI (mitral incompetence) 10/23/2011   . CHF (congestive heart failure) 10/23/2011   . COPD (chronic obstructive pulmonary disease) 10/23/2011   . ETOH abuse 10/23/2011   . VT (ventricular tachycardia) 10/23/2011     S/p AICD   . Anemia of chronic disease 10/23/2011   . HTN (hypertension) 10/23/2011   . Parkinsonism    . Unspecified cerebral artery occlusion with cerebral infarction    . Coronary artery disease    . CHF (congestive heart failure)    . COPD (chronic obstructive pulmonary disease)    . AICD (automatic cardioverter/defibrillator) present    . Hypertension    . Myocardial infarction june 2013   . CHF (congestive heart failure)      EF 30%   . Alcohol abuse, in remission    . Anemia    . HTN (hypertension)    . Ventricular tachycardia        Past Surgical History:  Past  Surgical History   Procedure Laterality Date   . Icd     . Hemicolectomy Left    . Cardiac defibrillator placement     . Pacemaker insertion         Social History:  History     Social History   . Marital Status: Widowed     Spouse Name: N/A     Number of Children: N/A   . Years of Education: N/A     Occupational History   . Not on file.     Social History Main Topics   . Smoking status: Not on file   . Smokeless tobacco: Not on file   . Alcohol Use: No      Comment: formerly, heavy drinker   . Drug Use: Not on file   . Sexually Active: Not on file     Other Topics Concern   . Not on file     Social History Narrative    ** Merged History Encounter **            Review of Systems:   Musculoskeletal ROS: positive for - muscle pain and pain in back - lower  Neurological ROS: positive for - poor recall      Physical Exam:  BP 140/66  Pulse 78  Temp(Src) 37.5 C (99.5 F) (Oral)  Resp 20  Ht 1.626 m (5\' 4" )  Wt 63.821 kg  (140 lb 11.2 oz)  BMI 24.14 kg/m2  SpO2 91%  Gen:  NAD. Resting comfortably  Heart:  RRR  Lungs: CTA B  Abdomen: +BS, soft, round non tender   Extremities: +5/5 strength and sensation is grossly intact bilaterally   No calve tenderness     Lab Results   Component Value Date    WBC 14.1* 07/23/2012    HGB 10.2* 07/23/2012    HCT 29* 07/23/2012    MCV 83 07/23/2012    PLT 245 07/23/2012         Lab results: 07/23/12  0816   Sodium 141   Potassium 4.1   Chloride 106   CO2 25   UN 4*   Creatinine 0.59   GFR,Caucasian 95   GFR,Black 109   Glucose 114*   Calcium 7.6*       No results found for this basename: PTI, INR,  in the last 8760 hours      Imaging: No results found.       Assessment:  Michelle [name] is a 67 y.o. female who was found to have a L1 burst fracture with 70% loss in height, which could be intermediate in age or sub acute    Recommendations: Discussed with Dr.Emersynn Deatley   -Suggested CT of lumbar spine  -MRI with STIR      Rosalita Levan, PA  NPI # [id number]  Pager # [phone]     I have seen and examined the patient. I agree with the resident's/PA's findings and plan of care as documented above.    C/O back pain, but denies leg pain.  Dysarthric speech, but completely oriented.  Moves all extremities to command with good strength, sensation intact.  Was unable to walk.  CT lumbar shows L1 compression deformity with kyphosis and retropulsion causing mild stenosis.  Rec MRI with STIR to age the fracture.  Also rec TLSO brace to be worn when OOB. May be removed when in bed and showering.  F/U with me in 6-8 weeks.     Karen Kays, MD

## 2012-07-23 NOTE — Progress Notes (Signed)
Inpatient Medicine Progress Note    Interval History:  Feels well.  Complains only of some low back pain  No abd pain, no nausea.  Eating better  No chest pain, no sob    Intake/Output    Intake/Output Summary (Last 24 hours) at 07/23/12 0854  Last data filed at 07/22/12 1907   Gross per 24 hour   Intake    300 ml   Output    175 ml   Net    125 ml        Vital Signs:   Temp:  [36.3 C (97.3 F)-37.5 C (99.5 F)] 37.5 C (99.5 F)  Heart Rate:  [73-80] 78  Resp:  [18-20] 20  BP: (119-140)/(50-66) 140/66 mmHg       Physical Exam:    General:  nad  HEENT:  mmm  Cardiovascular:  Reg rhythm, normal rate  Pulmonary:  Lungs clear  Gastrointestinal:  Soft, slightly distended, nontender, good bowel sounds   neuro:  B/l LE weak - 4/5 with muscle atrophy - no focal weakness.  Sensation intact    Data:      Recent Labs  Lab 07/23/12  0816 07/22/12  2055 07/22/12  0853   WBC 14.1* 12.4* 13.3*   Hemoglobin 10.2* 11.1* 10.8*   Hematocrit 29* 32* 32*   Platelets 245 258 235     Other Labs:      Recent Labs  Lab 07/22/12  0852 07/18/12  0541 07/17/12  0638   Sodium 139 145 148*   Potassium 2.9* 3.8 4.5   Chloride 104 116* 119*   CO2 26 22 17*   UN 3* 34* 66*   Creatinine 0.53 0.88 1.16*   GFR,Caucasian 98 68 49*   GFR,Black 113 78 56*   Glucose 115* 87 108*   Calcium 7.5* 7.9* 7.8*      X-rays the past 24 hours: No results found.     Assessment/Plan  Active Hospital Problems    Diagnosis   . AKI (acute kidney injury)      Resolved Hospital Problems    Diagnosis   No resolved problems to display.       C.diff -- improving - no abd pain, no fever, diearrhea improved. Wbc still up, but left shift decreased.  Po intake improved  --continue po flagyl - 14 day course    aki -- likely pre-renal/atn.  Resolved. Foley out  --repeat labs today    Hypokalemia -- replaced  --repeat labs today    Back pain - has age indeterminate L1 compression fx on CT -- she is non-ambulatory at baseline.  She has b/l LE weakness, but  seems at baseline.  --will have neurosurgery review scan to see if any brace needed  --pain meds    Rectal bleeding -- likely from c.diff - has stopped. hct down a bit, but generally stable      DC Planning  Possible dc later today - pending labs    Renelda Mom, MD   07/23/2012   8:54 AM     Inpatient checklist  Have Advanced Directives been addressed? Is MOLST in chart and order placed?  Are daily labs needed?  Verify activity orders/encourage mobilization  Remove unnecessary lines (IVs, O2, catheters)  Medication reconcilliation

## 2012-07-24 LAB — CBC AND DIFFERENTIAL
Baso # K/uL: 0 10*3/uL (ref 0.0–0.1)
Basophil %: 0 % (ref 0.1–1.2)
Eos # K/uL: 0.2 10*3/uL (ref 0.0–0.4)
Eosinophil %: 2 % (ref 0.7–5.8)
Hematocrit: 32 % — ABNORMAL LOW (ref 34–45)
Hemoglobin: 10.8 g/dL — ABNORMAL LOW (ref 11.2–15.7)
Lymph # K/uL: 2.1 10*3/uL (ref 1.2–3.7)
Lymphocyte %: 20 % (ref 19.3–51.7)
MCV: 85 fL (ref 79–95)
Mono # K/uL: 1.5 10*3/uL — ABNORMAL HIGH (ref 0.2–0.9)
Monocyte %: 14 % — ABNORMAL HIGH (ref 4.7–12.5)
Neut # K/uL: 6.8 10*3/uL — ABNORMAL HIGH (ref 1.6–6.1)
Nucl RBC # K/uL: 0.1 10*3/uL
Nucl RBC %: 1 /100 WBC — ABNORMAL HIGH (ref 0.0–0.2)
Platelets: 271 10*3/uL (ref 160–370)
RBC: 3.8 MIL/uL — ABNORMAL LOW (ref 3.9–5.2)
RDW: 16.9 % — ABNORMAL HIGH (ref 11.7–14.4)
Seg Neut %: 64 % (ref 34.0–71.1)
WBC: 10.6 10*3/uL — ABNORMAL HIGH (ref 4.0–10.0)

## 2012-07-24 LAB — ANISOCYTOSIS

## 2012-07-24 LAB — TARGET CELLS

## 2012-07-24 LAB — DIFF BASED ON: Diff Based On: 100 CELLS

## 2012-07-24 LAB — MANUAL DIFFERENTIAL

## 2012-07-24 LAB — SLIDE NUMBER: Slide # (Heme): 261

## 2012-07-24 NOTE — Progress Notes (Addendum)
Neurosurgery Progress Note    S: The patient was sleeping upon arrival. When I initially spoke to the patient she awoke looked at me and smiled, then returned to sleeping. So the writer continued to ask her questions and no mater the question she nodded. Was unable to get any information from her at this time.     O:BP 118/54  Pulse 71  Temp(Src) 35.8 C (96.5 F) (Oral)  Resp 20  Ht 1.626 m (5\' 4" )  Wt 63.821 kg (140 lb 11.2 oz)  BMI 24.14 kg/m2  SpO2 95%  General: NAD while in bed  Unable to perform the rest of exam       Ct Lumbar Spine Without Contrast    07/23/2012  IMPRESSION:   L1 burst fracture with 70% loss of height causing spinal stenosis  with the thecal sac measuring approximately 8 mm at that level.   Minimal spinal stenosis involving the L2-3 through L4-5 levels as  described above   END OF REPORT     ence of bowel  obstruction or free intraperitoneal air.   END OF REPORT   A/P: HD9 AKI, oliguria, with incidental finding of L1 burst fracture  appreciate the imaging that was done   -will discuss findings with Dr.Daxtyn Rottenberg   -will continue to follow     Rosalita Levan, PA  NPI # [id number]  Pager # [phone]      Neurosurgery PA    Spoke with patient and examined her with Dr. Mordecai Maes this a.m.    She complains of back pain, hurts to walk but was able to walk prior to her injury. Lives in [place].  Patient denies any LE pain, weakness or paresthesias.     O: BP 112/62  Pulse 86  Temp(Src) 36.5 C (97.7 F) (Oral)  Resp 18  Ht 1.626 m (5\' 4" )  Wt 63.821 kg (140 lb 11.2 oz)  BMI 24.14 kg/m2  SpO2 97%    A+O X 3   LE - moves LE in bed, + DP flexion bilaterally, sensation grossly intact    Plan: Images reviewed.  Recommend TLSO brace. Patient is to wear when OOB, she can remove in bed and/or chair.  MRI to date the fracture  Follow-up with Dr. Mordecai Maes 6-8 weeks  Call with further questions.    Lelon Mast [place] RPA-C  [phone], [id number] [phone]  [id number]

## 2012-07-24 NOTE — Progress Notes (Signed)
Inpatient Medicine Progress Note    Interval History:  Still with some back pain, but not too bad  No abd pain.  Had one loose stool yesterday.  No nasuea. Eating ok  No other complaints    Intake/Output    Intake/Output Summary (Last 24 hours) at 07/24/12 1355  Last data filed at 07/24/12 1259   Gross per 24 hour   Intake    500 ml   Output      0 ml   Net    500 ml        Vital Signs:   Temp:  [35.8 C (96.5 F)-37.1 C (98.8 F)] 36.5 C (97.7 F)  Heart Rate:  [71-90] 86  Resp:  [18-20] 18  BP: (110-118)/(50-65) 112/62 mmHg       Physical Exam:    General:  nad  HEENT:  mmm  Cardiovascular:  Reg rhythm, normal rate  Pulmonary:  Lungs clear  Gastrointestinal:  Soft, NT, ND  Skin:     Data:      Recent Labs  Lab 07/24/12  0644 07/23/12  0816 07/22/12  2055   WBC 10.6* 14.1* 12.4*   Hemoglobin 10.8* 10.2* 11.1*   Hematocrit 32* 29* 32*   Platelets 271 245 258     Other Labs:      Recent Labs  Lab 07/23/12  0816 07/22/12  0852 07/18/12  0541   Sodium 141 139 145   Potassium 4.1 2.9* 3.8   Chloride 106 104 116*   CO2 25 26 22    UN 4* 3* 34*   Creatinine 0.59 0.53 0.88   GFR,Caucasian 95 98 68   GFR,Black 109 113 78   Glucose 114* 115* 87   Calcium 7.6* 7.5* 7.9*      X-rays the past 24 hours: Ct Lumbar Spine Without Contrast    07/23/2012  IMPRESSION:   L1 burst fracture with 70% loss of height causing spinal stenosis  with the thecal sac measuring approximately 8 mm at that level.   Minimal spinal stenosis involving the L2-3 through L4-5 levels as  described above   END OF REPORT        Assessment/Plan  Active Hospital Problems    Diagnosis   . AKI (acute kidney injury)      Resolved Hospital Problems    Diagnosis   No resolved problems to display.       AKI - resolved - likely pre-renal from infection and poor intake.  Now off ivf and renal fucntion stable    C.diff colitis -- improving.   --continue po flagyl - plan 14 days total    Compression fx -- indeterminate age -- appreciate  neurosurgery input.  Pt with pain, but no neuro deficts.  She is wheelchair bound at baseline  --TLSO brace ordered - use when oob or sitting  --apap and prn oxy IR for pain  --mri can be done as outpatient  --follow up with Dr. Mordecai Maes in 6-8 weeks  --would start vit D for supplementation for likely osteoporosis and osteomalacia    Rectal bleeding resolved  --hct stable    DC Planning  Back to snf when tlso brace available - probably tomorrow    Renelda Mom, MD   07/24/2012   1:55 PM     Inpatient checklist  Have Advanced Directives been addressed? Is MOLST in chart and order placed?  Are daily labs needed?  Verify activity orders/encourage mobilization  Remove unnecessary lines (IVs, O2, catheters)  Medication reconcilliation

## 2012-07-24 NOTE — Discharge Instructions (Signed)
Brief Summary of Your Hospital Course (including key procedures and diagnostic test results):  You were admitted to [facility] for diarrhea and dehydration due to Clostridium difficile colitis intestinal infection as well as an acute kidney injury from being so dehydrated. You were treated with IV fluids, oral metronidazole (antibiotic). The gastroenterolgist was consulted and followed in your care. Your electrolytes were corrected and rechecked. You did have some rectal bleeding likely related to the colitis. Your renal function tests are now back to baseline. Your diet was advanced and you were cleared to return to the nursing home.     Neurosurgery was consulted as the CT scan of your spine showed an incidental finding of a new lumbar vetebra fracture. A thoracic-lumbar-sacral brace has been recommended and should be worn at all times while out of bed.      Your instructions:  Take all medications as prescribed.   TLSO brace on at all times while out of bed.     Recommended diet: low residue diet for 2 weeks until symptoms resolve then regular diet.     Recommended activity: activity as tolerated    Wound Care: none needed    If you experience any Shortness of breath, Fever greater than 100.5, Poor feeding, Poor urinary output, Vomiting or Nausea please follow up with the facility attending physician

## 2012-07-24 NOTE — Progress Notes (Signed)
Pt. Did not urinate all shift. Student nurse notified provider. Pt. Bladder scanned and noted to have . Per Levora Dredge, PA,  pt Straight cathed and of brown urine, was clear with no odor. Levora Dredge, PA was notified. Will continue to monitor pt.   Chrystine Oiler, RN

## 2012-07-24 NOTE — Progress Notes (Signed)
Orthotics & Prosthetics :    Measured for TLSO, will fit and deliver Tomorrow AM.    Fonnie Mu, [place]  Orthotics and Prosthetics Department  [phone]

## 2012-07-24 NOTE — Progress Notes (Addendum)
Pt had not urinated all shift, pt with 305 ml of urine in bladder via bladder scan, provider notified. no symptoms of painful abdomen or full bladder. Last time patient urinated was at 0601.

## 2012-07-25 ENCOUNTER — Ambulatory Visit
Admit: 2012-07-25 | Discharge: 2012-07-25 | Disposition: A | Discharge status: Home / Self Care | Payer: Self-pay | Source: Ambulatory Visit | Admitting: Internal Medicine

## 2012-07-25 DIAGNOSIS — S32010A Wedge compression fracture of first lumbar vertebra, initial encounter for closed fracture: Secondary | ICD-10-CM

## 2012-07-25 DIAGNOSIS — A0472 Enterocolitis due to Clostridium difficile, not specified as recurrent: Secondary | ICD-10-CM | POA: Diagnosis present

## 2012-07-25 LAB — CBC AND DIFFERENTIAL
Baso # K/uL: 0.1 10*3/uL (ref 0.0–0.1)
Basophil %: 1 % (ref 0.1–1.2)
Eos # K/uL: 0 10*3/uL (ref 0.0–0.4)
Eosinophil %: 0 % — ABNORMAL LOW (ref 0.7–5.8)
Hematocrit: 32 % — ABNORMAL LOW (ref 34–45)
Hemoglobin: 10.5 g/dL — ABNORMAL LOW (ref 11.2–15.7)
Lymph # K/uL: 2.3 10*3/uL (ref 1.2–3.7)
Lymphocyte %: 22 % (ref 19.3–51.7)
MCV: 85 fL (ref 79–95)
Mono # K/uL: 0.6 10*3/uL (ref 0.2–0.9)
Monocyte %: 6 % (ref 4.7–12.5)
Neut # K/uL: 7.3 10*3/uL — ABNORMAL HIGH (ref 1.6–6.1)
Nucl RBC # K/uL: 0 10*3/uL
Nucl RBC %: 0 /100 WBC (ref 0.0–0.2)
Platelets: 276 10*3/uL (ref 160–370)
RBC: 3.8 MIL/uL — ABNORMAL LOW (ref 3.9–5.2)
RDW: 17.6 % — ABNORMAL HIGH (ref 11.7–14.4)
Seg Neut %: 70 % (ref 34.0–71.1)
WBC: 10.3 10*3/uL — ABNORMAL HIGH (ref 4.0–10.0)

## 2012-07-25 LAB — ANISOCYTOSIS

## 2012-07-25 LAB — MANUAL DIFFERENTIAL

## 2012-07-25 LAB — TARGET CELLS

## 2012-07-25 LAB — BANDS: Bands %: 1 % (ref 0–10)

## 2012-07-25 LAB — SLIDE NUMBER: Slide # (Heme): 288

## 2012-07-25 LAB — DIFF BASED ON: Diff Based On: 100 CELLS

## 2012-07-25 LAB — POLYCHROMASIA

## 2012-07-25 MED ORDER — POTASSIUM CHLORIDE CRYS CR 20 MEQ PO TBCR *I*
20.0000 meq | ORAL_TABLET | Freq: Every day | ORAL | Status: AC
Start: 2012-07-25 — End: 2013-01-21

## 2012-07-25 MED ORDER — ACETAMINOPHEN 500 MG PO TABS *I*
1000.0000 mg | ORAL_TABLET | Freq: Three times a day (TID) | ORAL | Status: AC
Start: 2012-07-25 — End: ?

## 2012-07-25 MED ORDER — CARVEDILOL 6.25 MG PO TABS *I*
3.1250 mg | ORAL_TABLET | Freq: Every day | ORAL | Status: AC
Start: 2012-07-25 — End: ?

## 2012-07-25 MED ORDER — METRONIDAZOLE 500 MG PO TABS *I*
500.0000 mg | ORAL_TABLET | Freq: Three times a day (TID) | ORAL | Status: AC
Start: 2012-07-25 — End: 2012-08-02

## 2012-07-25 MED ORDER — FUROSEMIDE 20 MG PO TABS *I*
20.0000 mg | ORAL_TABLET | Freq: Every morning | ORAL | Status: DC
Start: 2012-07-25 — End: 2012-12-16

## 2012-07-25 NOTE — Progress Notes (Signed)
07/25/2012  7:08 AM  Pt incontinent large amount of urine, pt bladder scanned, post void residual=71cc. Merleen Milliner, RN

## 2012-07-25 NOTE — Progress Notes (Signed)
Report given to Lelon Mast, RN at [employer] at 1245pm.   Chrystine Oiler, RN

## 2012-07-25 NOTE — Progress Notes (Addendum)
Inpatient Medicine Progress Note    Interval History:  Await TLSO brace  Pt feels pretty well.  No back pain today  No nausea, no abd pain. Po intake remains a bit variable but drinking fairly well  No sob    Intake/Output    Intake/Output Summary (Last 24 hours) at 07/25/12 0821  Last data filed at 07/25/12 0820   Gross per 24 hour   Intake    760 ml   Output    250 ml   Net    510 ml        Vital Signs:   Temp:  [36 C (96.8 F)-36.5 C (97.7 F)] 36.3 C (97.4 F)  Heart Rate:  [71-86] 71  Resp:  [18] 18  BP: (102-112)/(60-70) 106/70 mmHg       Physical Exam:    General:  nad  HEENT:  ,mmm  Cardiovascular:  Reg  Rhythm, normal rate  Pulmonary:  Lungs clear  Gastrointestinal:  Soft, NT, ND, good bowel sounds  Skin:     Data:      Recent Labs  Lab 07/25/12  0737 07/24/12  0644 07/23/12  0816   WBC 10.3* 10.6* 14.1*   Hemoglobin 10.5* 10.8* 10.2*   Hematocrit 32* 32* 29*   Platelets 276 271 245     Other Labs:      Recent Labs  Lab 07/23/12  0816 07/22/12  0852   Sodium 141 139   Potassium 4.1 2.9*   Chloride 106 104   CO2 25 26   UN 4* 3*   Creatinine 0.59 0.53   GFR,Caucasian 95 98   GFR,Black 109 113   Glucose 114* 115*   Calcium 7.6* 7.5*      X-rays the past 24 hours: Ct Lumbar Spine Without Contrast    07/23/2012  IMPRESSION:   L1 burst fracture with 70% loss of height causing spinal stenosis  with the thecal sac measuring approximately 8 mm at that level.   Minimal spinal stenosis involving the L2-3 through L4-5 levels as  described above   END OF REPORT        Assessment/Plan  Active Hospital Problems    Diagnosis   . C. difficile colitis   . Compression fracture of L1 lumbar vertebra   . AKI (acute kidney injury)   . CHF (congestive heart failure)      Resolved Hospital Problems    Diagnosis   No resolved problems to display.       AKI - resolved.  --will restart home lasix on discharge    cdiff colitis -- improved.  No fevers.  Wbc improved. Diarrhea and pain improved  --continue po  flagyl -- today is day 7/14    compressoin fx - L1  With burst and retropulsion.  Appreciate neurosurgery input.    ---TLSO brace to be placed today - use when oob and sitting  --atc apap and prn vicodin  --follow up with Dr. Mordecai Maes in about 6-8 weeks  --MRI as outpatient for dating    chf -- chronic systolic dysfunction -  compensated.  Got ivf for several days.   --will restart home lasix at 20 mg with kcl  --restart home coreg  --no ace inhibitor due to renal issues    Parkinsonism -- continue sinemet    DC Planning  D/c to snf once TLSO brace available    D/w social work    Renelda Mom, MD   07/25/2012   8:21 AM  Inpatient checklist  Have Advanced Directives been addressed? Is MOLST in chart and order placed?  Are daily labs needed?  Verify activity orders/encourage mobilization  Remove unnecessary lines (IVs, O2, catheters)  Medication reconcilliation     Addendum:  Note edited on 6/19 to clarify CHF diagnosis.    Renelda Mom, MD  07/31/2012  8:32 AM

## 2012-07-25 NOTE — Continuity of Care (Signed)
NEW [facility] DEPARTMENT OF HEALTH  [facility]-Division of Quality and Surveillance for Nursing Homes and ICFs/MR  Patient Name  Michelle [name] MR Number  [id number] Account Number  [id number] Birthdate  [id number]    Hospital and Community                Patient Review Instrument  (HC-PRI)               RUG II:  PC6     I. ADMINISTRATIVE DATA     1. Operating Certificate Number  [phone] 2. Social Security Number  [id number]   3. Official Name of Hospital Completing this Review  [facility] SERVICE AREA    4A. Patient Name  Michelle [name] 10. Sex  female   4B. [place] Residence  [place] 11A. Date of Hospital Admission or Initial Agency Visit  07/15/2012   5. Date of [facility] Completion  July 25, 2012 11B. Date of Alternate level of Care Status in Hospital (if applicable)    6. Account Number  [id number] 12. Medicaid Number  ,    [id number]  MEDICARE IME ONLY,    MCR,    [id number]  MEDICAID,    MCD,    [id number]       7. Hospital Room Number  [phone] 13. Medicare Number     8. Name of Unit/Division/Building  W716/[id number] 14. Primary Payor     9. Date of Birth  [phone] 8. Reason for [facility] Completion  1 - RHCF application from hospital     II. MEDICAL EVENTS     16. Decubitus Level:        Location:  No reddened skin or breakdown     17. MEDICAL CONDITIONS During the past week:  1=Yes  2=No 18. MEDICAL TREATMENTS 1=Yes  2=No   A. Comatose No A. Tracheostomy Care/Suct No         B. Dehydration No B. Suctioning/General No         C. Internal Bleeding No C. Oxygen (Daily) No              D. Stasis Ulcer No D Respiratory Care (Daily) No         E. Terminally Ill No E. Nasal Gastric Feeding No         F. Contractures No F. Parenteral Feeding No         G. Diabetes Mellitus No G. Wound Care No         H. Urinary Tract Infection No H. Chemotherapy No         I. HIV Infection Symptomatic No I. Transfusion No         J. Accident No J. Dialysis No         K. Ventilator Dependent No K. Bowel and Bladder Rehab No           L. Catheter (Indwelling or  External) No              M. Physical Restraints  (Daytime) No             Adapted from [id number]    Version: 002F  Review Type: Update review  07/25/2012  2 of 8    NEW [facility] DEPARTMENT OF HEALTH  [facility]-Division of Quality and Surveillance for Nursing Homes and ICFs/MR  Patient Name  Michelle [name] MR Number  [id number] Account Number  [id number] Birthdate  [id number]    Hospital and [employer]  Patient Review Instrument  (HC-PRI)               III. Activities of Daily Living     19. Eating 2-Requires intermittent supervision (that is, verbal encouragement/guidance) and/or minimal physical assistance with minor parts of eating, such as cutting food, buttering bread or opening milk carton.    20. Mobility 5-Is wheeled, chairfast or bedfast. Relies on someone else to move about, if at all. (w/c with assist prior to admission)    21. Transfer 5-Cannot and is not gotten out of bed. (transfer with 2 assist prior to admission)    22. Toileting 4-Incontinent of bowel and/or bladder and is not taken to a bathroom.      IV. BEHAVIORS     23. Verbal Disruption No known history   24. Physical Agression No known history   25. Disruptive, Infantile or Socially Inappropriate Behavior No known history   26. Hallucinations No     V. SPECIALIZED SERVICES     27A. Physical Therapy  27B. Occupational Therapy    Level: Does not receive. Level: Does not receive.   Actual days/week:    Actual days/week::      Actual hours/week:    Actual hours/week:        28. Number of Physician Visits - 0     VI. DIAGNOSIS     29. Primary Problem: AKI,   Cdiff  Compression fracture of L1 lumbar vertebra     VII. PLAN OF CARE SUMMARY     30. Primary Diagnosis: AKI,   Cdiff  Compression fracture of L1 lumbar vertebra         PMH: See below         PSH:  has past surgical history that includes ICD; hemicolectomy (Left); Cardiac defibrillator placement; and Pacemaker insertion.   31A. Rehabilitation Potential:  .     NA               Comment:    31B.  Current therapy care plan:  NA            Comment:    32. Medications: See below   33A. Allergies: Review of patient's allergies indicates no known allergies (drug, envir, food or latex).   33B: Treatments: TLSO Back Brace for when OOB/sitting  Elevalte HOB 30 degrees or more   33C: Abnormal Labs: See below   33D: Precautions:  Falls  CONTACT           Comment:    33E: Pacemaker  Yes   14F: Diet:  Geriatric           34. Race/Ethnic Group White or Caucasian [1]     35. Certification    I have personally observed/interviewed this patient and completed this H/C PRI.            I certify that the information contained herein is a true abstract of this patient's condition and medical record.    Certified Assessor: Thea Gist, RN   Identification Number: [id number]             Adapted from [id number]    Version: 002F  Review Type: Update review  07/25/2012  1 of 1    PMH:    Past Medical History   Diagnosis Date   . CVA (cerebral infarction) 10/23/2011   . ASHD (arteriosclerotic heart disease) 10/23/2011   . MI (mitral incompetence) 10/23/2011   . CHF (congestive heart failure) 10/23/2011   .  COPD (chronic obstructive pulmonary disease) 10/23/2011   . ETOH abuse 10/23/2011   . VT (ventricular tachycardia) 10/23/2011     S/p AICD   . Anemia of chronic disease 10/23/2011   . HTN (hypertension) 10/23/2011   . Parkinsonism    . Unspecified cerebral artery occlusion with cerebral infarction    . Coronary artery disease    . CHF (congestive heart failure)    . COPD (chronic obstructive pulmonary disease)    . AICD (automatic cardioverter/defibrillator) present    . Hypertension    . Myocardial infarction june 2013   . CHF (congestive heart failure)      EF 30%   . Alcohol abuse, in remission    . Anemia    . HTN (hypertension)    . Ventricular tachycardia        Medications:    Current Facility-Administered Medications   Medication Dose Route Frequency   . sodium chloride 0.9 % IV  3 mL/hr Intravenous Continuous   . sodium chloride 0.9 % IV   3 mL/hr Intravenous Continuous   . metroNIDAZOLE (FLAGYL) tablet 500 mg  500 mg Oral Q8H SCH   . acetaminophen (TYLENOL) tablet 1,000 mg  1,000 mg Oral TID   . amiodarone (PACERONE) tablet 400 mg  400 mg Oral Daily   . aspirin EC tablet 81 mg  81 mg Oral Daily   . atorvastatin (LIPITOR) tablet 10 mg  10 mg Oral Daily with dinner   . carbidopa-levodopa (SINEMET) 25-100 MG per tablet 1 tablet  1 tablet Oral BID   . cyanocobalamin (Vitamin B-12) tablet 1,000 mcg  1,000 mcg Oral Daily   . ipratropium-albuterol (DUONEB) 0.5-2.5 (3) MG/3ML nebulization solution 3 mL  3 mL Nebulization 4x Daily PRN   . sertraline (ZOLOFT) tablet 50 mg  50 mg Oral Daily   . sodium chloride 0.9 % flush 5 mL  5 mL Intravenous Q8H   . ondansetron (ZOFRAN) injection 4 mg  4 mg Intravenous Q6H PRN   . calcium carbonate (TUMS) chewable tablet 1,000 mg  1,000 mg Oral Q8H PRN       Abnormal Labs:    Recent Results (from the past 72 hour(s))   CBC AND DIFFERENTIAL    Collection Time    07/22/12  8:55 PM       Result Value Range    WBC 12.4 (*) 4.0 - 10.0 THOU/uL    RBC 3.9  3.9 - 5.2 MIL/uL    Hemoglobin 11.1 (*) 11.2 - 15.7 g/dL    Hematocrit 32 (*) 34 - 45 %    MCV 83  79 - 95 fL    RDW 16.2 (*) 11.7 - 14.4 %    Platelets 258  160 - 370 THOU/uL    Seg Neut % 68.0  34.0 - 71.1 %    Lymphocyte % 16.0 (*) 19.3 - 51.7 %    Monocyte % 10.0  4.7 - 12.5 %    Eosinophil % 0.0 (*) 0.7 - 5.8 %    Basophil % 0.0  0.1 - 1.2 %    Neut # K/uL 8.8 (*) 1.6 - 6.1 THOU/uL    Lymph # K/uL 2.4  1.2 - 3.7 THOU/uL    Mono # K/uL 1.2 (*) 0.2 - 0.9 THOU/uL    Eos # K/uL 0.0  0.0 - 0.4 THOU/uL    Baso # K/uL 0.0  0.0 - 0.1 THOU/uL    Nucl RBC % 1.0 (*) 0.0 - 0.2 /  100 WBC    Nucl RBC # K/uL 0.1     BANDS    Collection Time    07/22/12  8:55 PM       Result Value Range    Bands % 3  0 - 10 %   REACTIVE LYMPHS    Collection Time    07/22/12  8:55 PM       Result Value Range    React Lymph % 3.0  0.0 - 6.0 %   DIFF BASED ON    Collection Time    07/22/12  8:55 PM       Result  Value Range    Diff Based On 100     TARGET CELLS    Collection Time    07/22/12  8:55 PM       Result Value Range    Target Cells Present     SLIDE NUMBER    Collection Time    07/22/12  8:55 PM       Result Value Range    Slide # (Heme) 215     MANUAL DIFFERENTIAL    Collection Time    07/22/12  8:55 PM       Result Value Range    Manual DIFF RESULTS     CBC AND DIFFERENTIAL    Collection Time    07/23/12  8:16 AM       Result Value Range    WBC 14.1 (*) 4.0 - 10.0 THOU/uL    RBC 3.5 (*) 3.9 - 5.2 MIL/uL    Hemoglobin 10.2 (*) 11.2 - 15.7 g/dL    Hematocrit 29 (*) 34 - 45 %    MCV 83  79 - 95 fL    RDW 16.4 (*) 11.7 - 14.4 %    Platelets 245  160 - 370 THOU/uL    Seg Neut % 81.0 (*) 34.0 - 71.1 %    Lymphocyte % 11.0 (*) 19.3 - 51.7 %    Monocyte % 6.0  4.7 - 12.5 %    Eosinophil % 1.0  0.7 - 5.8 %    Basophil % 0.0  0.1 - 1.2 %    Neut # K/uL 11.6 (*) 1.6 - 6.1 THOU/uL    Lymph # K/uL 1.6  1.2 - 3.7 THOU/uL    Mono # K/uL 0.9  0.2 - 0.9 THOU/uL    Eos # K/uL 0.1  0.0 - 0.4 THOU/uL    Baso # K/uL 0.0  0.0 - 0.1 THOU/uL    Nucl RBC % 1.0 (*) 0.0 - 0.2 /100 WBC    Nucl RBC # K/uL 0.1     BASIC METABOLIC PANEL    Collection Time    07/23/12  8:16 AM       Result Value Range    Glucose 114 (*) 60 - 99 mg/dL    Sodium 469  629 - 528 mmol/L    Potassium 4.1  3.3 - 5.1 mmol/L    Chloride 106  96 - 108 mmol/L    CO2 25  20 - 28 mmol/L    Anion Gap 10  7 - 16    UN 4 (*) 6 - 20 mg/dL    Creatinine 4.13  2.44 - 0.95 mg/dL    GFR,Caucasian 95      GFR,Black 109      Calcium 7.6 (*) 8.6 - 10.2 mg/dL   SLIDE NUMBER    Collection Time    07/23/12  8:16  AM       Result Value Range    Slide # (Heme) 239     BANDS    Collection Time    07/23/12  8:16 AM       Result Value Range    Bands % 1  0 - 10 %   DIFF BASED ON    Collection Time    07/23/12  8:16 AM       Result Value Range    Diff Based On 100     TARGET CELLS    Collection Time    07/23/12  8:16 AM       Result Value Range    Target Cells Present     VACUOLATED SEGS    Collection  Time    07/23/12  8:16 AM       Result Value Range    Vacuolated Neutrophils Present     GIANT PLATELETS    Collection Time    07/23/12  8:16 AM       Result Value Range    Giant PLTs Present     MANUAL DIFFERENTIAL    Collection Time    07/23/12  8:16 AM       Result Value Range    Manual DIFF RESULTS     POCT GLUCOSE    Collection Time    07/23/12  6:03 PM       Result Value Range    Glucose POCT 116 (*) 60 - 99 mg/dL   POCT GLUCOSE    Collection Time    07/23/12  9:46 PM       Result Value Range    Glucose POCT 104 (*) 60 - 99 mg/dL   CBC AND DIFFERENTIAL    Collection Time    07/24/12  6:44 AM       Result Value Range    WBC 10.6 (*) 4.0 - 10.0 THOU/uL    RBC 3.8 (*) 3.9 - 5.2 MIL/uL    Hemoglobin 10.8 (*) 11.2 - 15.7 g/dL    Hematocrit 32 (*) 34 - 45 %    MCV 85  79 - 95 fL    RDW 16.9 (*) 11.7 - 14.4 %    Platelets 271  160 - 370 THOU/uL    Seg Neut % 64.0  34.0 - 71.1 %    Lymphocyte % 20.0  19.3 - 51.7 %    Monocyte % 14.0 (*) 4.7 - 12.5 %    Eosinophil % 2.0  0.7 - 5.8 %    Basophil % 0.0  0.1 - 1.2 %    Neut # K/uL 6.8 (*) 1.6 - 6.1 THOU/uL    Lymph # K/uL 2.1  1.2 - 3.7 THOU/uL    Mono # K/uL 1.5 (*) 0.2 - 0.9 THOU/uL    Eos # K/uL 0.2  0.0 - 0.4 THOU/uL    Baso # K/uL 0.0  0.0 - 0.1 THOU/uL    Nucl RBC % 1.0 (*) 0.0 - 0.2 /100 WBC    Nucl RBC # K/uL 0.1     DIFF BASED ON    Collection Time    07/24/12  6:44 AM       Result Value Range    Diff Based On 100     ANISOCYTOSIS    Collection Time    07/24/12  6:44 AM       Result Value Range    Anisocytosis Present     TARGET CELLS  Collection Time    07/24/12  6:44 AM       Result Value Range    Target Cells Present     SLIDE NUMBER    Collection Time    07/24/12  6:44 AM       Result Value Range    Slide # (Heme) 261     MANUAL DIFFERENTIAL    Collection Time    07/24/12  6:44 AM       Result Value Range    Manual DIFF RESULTS     CBC AND DIFFERENTIAL    Collection Time    07/25/12  7:37 AM       Result Value Range    WBC 10.3 (*) 4.0 - 10.0 THOU/uL    RBC 3.8 (*) 3.9  - 5.2 MIL/uL    Hemoglobin 10.5 (*) 11.2 - 15.7 g/dL    Hematocrit 32 (*) 34 - 45 %    MCV 85  79 - 95 fL    RDW 17.6 (*) 11.7 - 14.4 %    Platelets 276  160 - 370 THOU/uL    Seg Neut % 70.0  34.0 - 71.1 %    Lymphocyte % 22.0  19.3 - 51.7 %    Monocyte % 6.0  4.7 - 12.5 %    Eosinophil % 0.0 (*) 0.7 - 5.8 %    Basophil % 1.0  0.1 - 1.2 %    Neut # K/uL 7.3 (*) 1.6 - 6.1 THOU/uL    Lymph # K/uL 2.3  1.2 - 3.7 THOU/uL    Mono # K/uL 0.6  0.2 - 0.9 THOU/uL    Eos # K/uL 0.0  0.0 - 0.4 THOU/uL    Baso # K/uL 0.1  0.0 - 0.1 THOU/uL    Nucl RBC % 0.0  0.0 - 0.2 /100 WBC    Nucl RBC # K/uL 0.0          *Note: Due to a large number of results for the requested time period, some results have not been displayed. A complete set of results can be found in Results Review.

## 2012-07-25 NOTE — Progress Notes (Signed)
Orthotics & Prosthetics    Measured, fit and delivered [place] soft TLSO. Fit and function are appropriate. Patient was instructed in proper donning, and wear and care instructions were given verbally. Written instructions provided also.    Lou [place], [place]  Orthotics and Prosthetics Department  [phone]    Please print for discharge:

## 2012-07-25 NOTE — Progress Notes (Addendum)
SW was notified that pt is ready for discharge. SW called the [facility] and left a message inquiring about d/c and a 1pm pick up. SW notified the medical team, the nurse, the unit secretary and requested an updated PRI. SW also called pt's son Kenard Gower and left a message to update.     Nurse Report [phone]    Marjie Skiff, LMSW  07/25/2012  10:10 AM      SW received a call back from [place], admissions at the [facility] who reports that pt can return and transport will be set up for 1:30pm.   Marjie Skiff, LMSW  07/25/2012  10:59 AM    SW called Clydie Braun, admissions at SNF to notify that transport is late. SW also called [facility] who reports that they are running late and transport availability is unknown.   Marjie Skiff, LMSW  07/25/2012  2:32 PM

## 2012-07-25 NOTE — Discharge Summary (Signed)
Name: Michelle [name] MRN: [id number] DOB: 15-Sep-1945     Admit Date: 07/15/2012   Date of Discharge: 07/25/2012    Discharge Attending Physician: Renelda Mom      Hospitalization Summary    CONCISE NARRATIVE: Pt was admitted [facility] hospital for acute kidney injury due to an intestinal infection w/ C diff. Pt was aggressively hydrated and treated with flagyl. The diarrhea and the kidney injury are much better. CT of the abdomen showed changes c/w colitis. She did have some BRBPR that has resolved and hct unchanged. (she was seen by GI, but no interventions recommended) Stooling now 1 to 2 times daily. Will cont flagyl for 14 days total.        Incidentally, an L1 burst fx was noted on the CT of her abdomen. She did c/o back pain in that area so neurosurgery was consulted and a lumbar CT was ordered showing a burst fracture at L1 with some retropulsion of bone and spinal canal narrowing.  She was seen by Dr. Mordecai Maes of neurosurgery and TLSO brace recommended for when OOB or sitting. MRI of L spine recommended as outpatient to help determine age of fracture and then follow up with DR. Girgis in 6-8 weeks.       CT RESULTS: CT abdomen and pelvis - Slight wall thickening of the proximal descending colon with free fluid in the pelvis which could relate to some mild colitis. Diverticula are not definitely seen in this region but infectious colitides are in the differential. Colonic diverticulosis in the region of the right colon and sigmoid colon      2. There is an L1 burst fracture with a least a 70% loss of height and a small amount of retropulsion of bone. This is of indeterminate age but could be subacute.    3. Sludge and stones within the gallbladder. Normal appendix.     4. Questionable left breast nodular density. This could relate to benign or malignant disease. If the patient has not had recent mammography further evaluation is recommended.    5. Advanced atherosclerotic disease.     6. Pulmonary nodular density.  This could relate to benign or malignant disease and correlation with a chest CT is recommended on a nonemergent basis.       RUQ ultrasound - Hepatomegaly with fatty infiltration.     Cholelithiasis without additional evidence of biliary abnormality.    CT L spine -- There is L1 burst fracture with 70% loss of height. There is    retropulsion of bone. This is narrowing the thecal sac to    approximately 8 mm. The anterior and middle columns are involved.      There is facet arthropathy evident at the L2-3 level through the    L5-S1 level. There is associated ligamentous hypertrophy at the L2-3,    L3-4 and L4-5 levels causing minimal spinal stenosis. There is no    evidence of significant foraminal stenosis.      There is no evidence of spinal or foraminal stenosis at the T10-11    through T11-12 disc spaces.          SIGNIFICANT MED CHANGES: Yes  Metronidazole was added for C difficile colitis.   Lowered coreg to 3.125 due to low bp  Added around the clock tylenol for pain         CONSULTANT SERVICE   [place], [facility]   [place], [place] [facility]  Signed: Renelda Mom, MD  On: 07/25/2012  at: 8:34 AM

## 2012-07-25 NOTE — Progress Notes (Signed)
Pt with no urine output on evening shift.  Bladder scan showing .  Pt has poor PO intake.  Paged D. Andrey Farmer PA.  Will continue to monitor.  Carmelia Roller, RN

## 2012-07-28 ENCOUNTER — Ambulatory Visit
Admit: 2012-07-28 | Discharge: 2012-07-28 | Disposition: A | Discharge status: Home / Self Care | Payer: Self-pay | Source: Ambulatory Visit | Attending: Ophthalmology | Admitting: Ophthalmology

## 2012-07-28 LAB — COMPREHENSIVE METABOLIC PANEL
ALT: 28 U/L (ref 0–35)
AST: 55 U/L — ABNORMAL HIGH (ref 0–35)
Albumin: 2.8 g/dL — ABNORMAL LOW (ref 3.5–5.2)
Alk Phos: 232 U/L — ABNORMAL HIGH (ref 35–105)
Anion Gap: 9 (ref 7–16)
Bilirubin,Total: 0.4 mg/dL (ref 0.0–1.2)
CO2: 30 mmol/L — ABNORMAL HIGH (ref 20–28)
Calcium: 7.7 mg/dL — ABNORMAL LOW (ref 8.6–10.2)
Chloride: 104 mmol/L (ref 96–108)
Creatinine: 0.66 mg/dL (ref 0.51–0.95)
GFR,Black: 105 *
GFR,Caucasian: 91 *
Lab: 10 mg/dL (ref 6–20)
Potassium: 4.2 mmol/L (ref 3.3–5.1)
Sodium: 143 mmol/L (ref 133–145)
Total Protein: 5.5 g/dL — ABNORMAL LOW (ref 6.3–7.7)

## 2012-07-28 LAB — CBC
Hematocrit: 32 % — ABNORMAL LOW (ref 34–45)
Hemoglobin: 10 g/dL — ABNORMAL LOW (ref 11.2–15.7)
MCV: 88 fL (ref 79–95)
Platelets: 314 10*3/uL (ref 160–370)
RBC: 3.6 MIL/uL — ABNORMAL LOW (ref 3.9–5.2)
RDW: 18.6 % — ABNORMAL HIGH (ref 11.7–14.4)
WBC: 8.7 10*3/uL (ref 4.0–10.0)

## 2012-07-28 LAB — GLUCOSE: Glucose: 110 mg/dL — ABNORMAL HIGH (ref 74–106)

## 2012-08-07 ENCOUNTER — Ambulatory Visit
Admit: 2012-08-07 | Discharge: 2012-08-07 | Disposition: A | Discharge status: Home / Self Care | Payer: Self-pay | Source: Ambulatory Visit | Attending: Emergency Medicine | Admitting: Emergency Medicine

## 2012-08-07 ENCOUNTER — Ambulatory Visit
Admit: 2012-08-07 | Discharge: 2012-08-07 | Disposition: A | Discharge status: Home / Self Care | Payer: Self-pay | Source: Ambulatory Visit | Attending: Geriatric Medicine | Admitting: Geriatric Medicine

## 2012-08-07 LAB — COMPREHENSIVE METABOLIC PANEL
ALT: 11 U/L (ref 0–35)
AST: 38 U/L — ABNORMAL HIGH (ref 0–35)
Albumin: 3.5 g/dL (ref 3.5–5.2)
Alk Phos: 173 U/L — ABNORMAL HIGH (ref 35–105)
Anion Gap: 12 (ref 7–16)
Bilirubin,Total: 0.6 mg/dL (ref 0.0–1.2)
CO2: 27 mmol/L (ref 20–28)
Calcium: 8.8 mg/dL (ref 8.6–10.2)
Chloride: 103 mmol/L (ref 96–108)
Creatinine: 0.91 mg/dL (ref 0.51–0.95)
GFR,Black: 75 *
GFR,Caucasian: 65 *
Lab: 17 mg/dL (ref 6–20)
Potassium: 4.2 mmol/L (ref 3.3–5.1)
Sodium: 142 mmol/L (ref 133–145)
Total Protein: 6.7 g/dL (ref 6.3–7.7)

## 2012-08-07 LAB — GLUCOSE: Glucose: 116 mg/dL — ABNORMAL HIGH (ref 74–106)

## 2012-08-07 LAB — BILIRUBIN, DIRECT: Bilirubin,Direct: 0.3 mg/dL (ref 0.0–0.3)

## 2012-09-03 ENCOUNTER — Ambulatory Visit
Admit: 2012-09-03 | Discharge: 2012-09-03 | Disposition: A | Discharge status: Home / Self Care | Payer: Self-pay | Source: Ambulatory Visit | Attending: Geriatric Medicine | Admitting: Geriatric Medicine

## 2012-09-03 LAB — CBC AND DIFFERENTIAL
Baso # K/uL: 0 10*3/uL (ref 0.0–0.1)
Basophil %: 0.4 % (ref 0.1–1.2)
Eos # K/uL: 0.3 10*3/uL (ref 0.0–0.4)
Eosinophil %: 3.1 % (ref 0.7–5.8)
Hematocrit: 37 % (ref 34–45)
Hemoglobin: 12 g/dL (ref 11.2–15.7)
Lymph # K/uL: 2.6 10*3/uL (ref 1.2–3.7)
Lymphocyte %: 27.3 % (ref 19.3–51.7)
MCV: 91 fL (ref 79–95)
Mono # K/uL: 0.7 10*3/uL (ref 0.2–0.9)
Monocyte %: 7.3 % (ref 4.7–12.5)
Neut # K/uL: 5.9 10*3/uL (ref 1.6–6.1)
Platelets: 255 10*3/uL (ref 160–370)
RBC: 4.1 MIL/uL (ref 3.9–5.2)
RDW: 17.3 % — ABNORMAL HIGH (ref 11.7–14.4)
Seg Neut %: 61.9 % (ref 34.0–71.1)
WBC: 9.6 10*3/uL (ref 4.0–10.0)

## 2012-09-03 LAB — BASIC METABOLIC PANEL
Anion Gap: 11 (ref 7–16)
CO2: 27 mmol/L (ref 20–28)
Calcium: 8.9 mg/dL (ref 8.6–10.2)
Chloride: 103 mmol/L (ref 96–108)
Creatinine: 0.74 mg/dL (ref 0.51–0.95)
GFR,Black: 97 *
GFR,Caucasian: 84 *
Glucose: 101 mg/dL — ABNORMAL HIGH (ref 60–99)
Lab: 18 mg/dL (ref 6–20)
Potassium: 3.9 mmol/L (ref 3.3–5.1)
Sodium: 141 mmol/L (ref 133–145)

## 2012-09-03 LAB — NT-PRO BNP: NT-pro BNP: 2166 pg/mL — ABNORMAL HIGH (ref 0–900)

## 2012-09-05 ENCOUNTER — Ambulatory Visit
Admit: 2012-09-05 | Discharge: 2012-09-05 | Disposition: A | Discharge status: Home / Self Care | Payer: Self-pay | Source: Ambulatory Visit | Attending: Geriatric Medicine | Admitting: Geriatric Medicine

## 2012-09-05 LAB — CBC AND DIFFERENTIAL
Baso # K/uL: 0 10*3/uL (ref 0.0–0.1)
Basophil %: 0.2 % (ref 0.1–1.2)
Eos # K/uL: 0.2 10*3/uL (ref 0.0–0.4)
Eosinophil %: 2.3 % (ref 0.7–5.8)
Hematocrit: 35 % (ref 34–45)
Hemoglobin: 11 g/dL — ABNORMAL LOW (ref 11.2–15.7)
Lymph # K/uL: 2.3 10*3/uL (ref 1.2–3.7)
Lymphocyte %: 25.5 % (ref 19.3–51.7)
MCV: 92 fL (ref 79–95)
Mono # K/uL: 0.9 10*3/uL (ref 0.2–0.9)
Monocyte %: 10 % (ref 4.7–12.5)
Neut # K/uL: 5.5 10*3/uL (ref 1.6–6.1)
Platelets: 209 10*3/uL (ref 160–370)
RBC: 3.8 MIL/uL — ABNORMAL LOW (ref 3.9–5.2)
RDW: 17.7 % — ABNORMAL HIGH (ref 11.7–14.4)
Seg Neut %: 62 % (ref 34.0–71.1)
WBC: 8.9 10*3/uL (ref 4.0–10.0)

## 2012-09-05 LAB — BASIC METABOLIC PANEL
Anion Gap: 10 (ref 7–16)
CO2: 29 mmol/L — ABNORMAL HIGH (ref 20–28)
Calcium: 8.8 mg/dL (ref 8.6–10.2)
Chloride: 106 mmol/L (ref 96–108)
Creatinine: 0.71 mg/dL (ref 0.51–0.95)
GFR,Black: 102 *
GFR,Caucasian: 88 *
Lab: 18 mg/dL (ref 6–20)
Potassium: 3.6 mmol/L (ref 3.3–5.1)
Sodium: 145 mmol/L (ref 133–145)

## 2012-09-05 LAB — NT-PRO BNP: NT-pro BNP: 1537 pg/mL — ABNORMAL HIGH (ref 0–900)

## 2012-09-05 LAB — GLUCOSE: Glucose: 87 mg/dL (ref 74–106)

## 2012-09-08 ENCOUNTER — Ambulatory Visit
Admit: 2012-09-08 | Discharge: 2012-09-08 | Disposition: A | Discharge status: Home / Self Care | Payer: Self-pay | Source: Ambulatory Visit | Attending: Geriatric Medicine | Admitting: Geriatric Medicine

## 2012-09-08 LAB — BASIC METABOLIC PANEL
Anion Gap: 10 (ref 7–16)
CO2: 30 mmol/L — ABNORMAL HIGH (ref 20–28)
Calcium: 8.8 mg/dL (ref 8.6–10.2)
Chloride: 103 mmol/L (ref 96–108)
Creatinine: 0.67 mg/dL (ref 0.51–0.95)
GFR,Black: 105 *
GFR,Caucasian: 91 *
Lab: 16 mg/dL (ref 6–20)
Potassium: 3.7 mmol/L (ref 3.3–5.1)
Sodium: 143 mmol/L (ref 133–145)

## 2012-09-08 LAB — NT-PRO BNP: NT-pro BNP: 2314 pg/mL — ABNORMAL HIGH (ref 0–900)

## 2012-09-08 LAB — GLUCOSE: Glucose: 92 mg/dL (ref 74–106)

## 2012-09-15 ENCOUNTER — Ambulatory Visit
Admit: 2012-09-15 | Discharge: 2012-09-15 | Disposition: A | Discharge status: Home / Self Care | Payer: Self-pay | Source: Ambulatory Visit | Attending: Geriatric Medicine | Admitting: Geriatric Medicine

## 2012-09-15 LAB — BASIC METABOLIC PANEL
Anion Gap: 14 (ref 7–16)
CO2: 27 mmol/L (ref 20–28)
Calcium: 8.8 mg/dL (ref 8.6–10.2)
Chloride: 102 mmol/L (ref 96–108)
Creatinine: 0.61 mg/dL (ref 0.51–0.95)
GFR,Black: 108 *
GFR,Caucasian: 94 *
Lab: 17 mg/dL (ref 6–20)
Potassium: 3.7 mmol/L (ref 3.3–5.1)
Sodium: 143 mmol/L (ref 133–145)

## 2012-09-15 LAB — NT-PRO BNP: NT-pro BNP: 2048 pg/mL — ABNORMAL HIGH (ref 0–900)

## 2012-09-15 LAB — GLUCOSE: Glucose: 90 mg/dL (ref 74–106)

## 2012-09-22 ENCOUNTER — Ambulatory Visit
Admit: 2012-09-22 | Discharge: 2012-09-22 | Disposition: A | Discharge status: Home / Self Care | Payer: Self-pay | Source: Ambulatory Visit | Attending: Geriatric Medicine | Admitting: Geriatric Medicine

## 2012-09-22 LAB — COMPREHENSIVE METABOLIC PANEL
ALT: 18 U/L (ref 0–35)
AST: 30 U/L (ref 0–35)
Albumin: 3.3 g/dL — ABNORMAL LOW (ref 3.5–5.2)
Alk Phos: 146 U/L — ABNORMAL HIGH (ref 35–105)
Anion Gap: 13 (ref 7–16)
Bilirubin,Total: 0.4 mg/dL (ref 0.0–1.2)
CO2: 28 mmol/L (ref 20–28)
Calcium: 8.7 mg/dL (ref 8.6–10.2)
Chloride: 101 mmol/L (ref 96–108)
Creatinine: 0.57 mg/dL (ref 0.51–0.95)
GFR,Black: 111 *
GFR,Caucasian: 96 *
Lab: 11 mg/dL (ref 6–20)
Potassium: 3.7 mmol/L (ref 3.3–5.1)
Sodium: 142 mmol/L (ref 133–145)
Total Protein: 6.2 g/dL — ABNORMAL LOW (ref 6.3–7.7)

## 2012-09-22 LAB — NT-PRO BNP: NT-pro BNP: 3950 pg/mL — ABNORMAL HIGH (ref 0–900)

## 2012-09-22 LAB — GLUCOSE: Glucose: 82 mg/dL (ref 74–106)

## 2012-09-22 LAB — TSH: TSH: 0.62 u[IU]/mL (ref 0.27–4.20)

## 2012-10-20 ENCOUNTER — Ambulatory Visit
Admit: 2012-10-20 | Discharge: 2012-10-20 | Disposition: A | Discharge status: Home / Self Care | Payer: Self-pay | Source: Ambulatory Visit | Attending: Geriatric Medicine | Admitting: Geriatric Medicine

## 2012-10-20 LAB — GLUCOSE: Glucose: 93 mg/dL (ref 74–106)

## 2012-10-20 LAB — BASIC METABOLIC PANEL
Anion Gap: 14 (ref 7–16)
CO2: 30 mmol/L — ABNORMAL HIGH (ref 20–28)
Calcium: 9.1 mg/dL (ref 8.6–10.2)
Chloride: 101 mmol/L (ref 96–108)
Creatinine: 1.19 mg/dL — ABNORMAL HIGH (ref 0.51–0.95)
GFR,Black: 54 * — AB
GFR,Caucasian: 47 * — AB
Lab: 28 mg/dL — ABNORMAL HIGH (ref 6–20)
Potassium: 4.3 mmol/L (ref 3.3–5.1)
Sodium: 145 mmol/L (ref 133–145)

## 2012-10-22 ENCOUNTER — Other Ambulatory Visit: Payer: Self-pay | Admitting: Gastroenterology

## 2012-10-28 ENCOUNTER — Non-Acute Institutional Stay: Payer: Self-pay | Admitting: Geriatric Medicine

## 2012-10-28 ENCOUNTER — Encounter: Payer: Self-pay | Admitting: Geriatric Medicine

## 2012-10-28 NOTE — Progress Notes (Signed)
Geriatrics  [facility] Chronic Note    Patient Name: Michelle [name]   Patient DOB: 07-19-1945   Patient MR#: [id number]   Facility: [facility]   Unit:        Reason for Visit  Michelle [name] was seen today for follow-up of chronic conditions.    Interval History:  The patient says she is feeling fairly well at this time.  She denies shortness of breath or swelling of her feet or legs.  She denies any chest pain or palpitations.  She denies lightheadednes.  She had been taken off Lasix however she suffered an accessory patient of CHF in mid July and is now receiving Lasix 40 mg a day and she denies any shortness of breath at this time.  She did develop an open area on her left heel and she now has a heel cup and a protective boot for the left heel and she denies any pain at that site.  Past Medical History:    Medical History:  Past Medical History   Diagnosis Date   . CVA (cerebral infarction) 10/23/2011   . ASHD (arteriosclerotic heart disease) 10/23/2011   . MI (mitral incompetence) 10/23/2011   . CHF (congestive heart failure) 10/23/2011   . COPD (chronic obstructive pulmonary disease) 10/23/2011   . ETOH abuse 10/23/2011   . VT (ventricular tachycardia) 10/23/2011     S/p AICD   . Anemia of chronic disease 10/23/2011   . HTN (hypertension) 10/23/2011   . Parkinsonism    . Unspecified cerebral artery occlusion with cerebral infarction    . Coronary artery disease    . CHF (congestive heart failure)    . COPD (chronic obstructive pulmonary disease)    . AICD (automatic cardioverter/defibrillator) present    . Hypertension    . Myocardial infarction june 2013   . CHF (congestive heart failure)      EF 30%   . Alcohol abuse, in remission    . Anemia    . HTN (hypertension)    . Ventricular tachycardia        Surgical History  Past Surgical History   Procedure Laterality Date   . Icd     . Hemicolectomy Left    . Cardiac defibrillator placement     . Pacemaker insertion          Review of Systems: Gastrointestinal she denies any constipation  or diarrhea or heartburn or difficulty swallowing food.  She also denies nausea vomiting or any abdominal pain. Skin she denies any itching or rash or pain however she has developed an open area on her left heel probably due to pressure and she now has a heel cup in a boot on her left foot.  Musculoskeletal: She says her pain is under pretty good control at this time she has had chronic back pain but it is not bothering her much at this time.        Physical Examination:  Filed Vitals:    10/28/12 1540   BP: 120/60   Pulse: 68   Resp: 16       Physical Exam On physical exam, the patient is sitting up in no acute distress.  Patient is alert and oriented x3.  Patient moves all extremities normally except for a left foot drop.  There is no focal weakness no tremor and no rigidity.  The head is atraumatic normocephalic.  There is no tenderness over the face sinuses or scalp.  The eyes show no scleral icterus,  no conjunctival injection, no drainage from either eye.  The mouth reveals moist mucosa, the pharynx is noninjected and there are no exudates.  Exam of the neck reveals no JVD, no neck masses, no thyromegaly and the trachea is midline.  Exam of the chest reveals no chest wall tenderness there is an AICD palpable in the left anterior chest wall, lungs are clear. Cardiac rhythm is regular at 68 per minute.  There is no murmur gallop or rub.  S1 and S2 are normal. Abdominal exam reveals no tenderness, no guarding, no mass, no organomegaly.  Bowel sounds are normal, there is no CVA tenderness.  Extremities revealed no edema, no calf tenderness.  There is no joint tenderness. The skin exam reveals a stage II open area which appears clean over the left heel, and no rash.      Allergies  No Known Allergies (drug, envir, food or latex)     Medications:      Psychotropic Medications Zoloft 50 mg a day for anxiety and depression.  She is responding well to this and this will be continued.      Pain Management Tylenol 650 mg  4 times a day as needed.    Latest Laboratory Results  Lab Results   Component Value Date    NA 145 10/20/2012    K 4.3 10/20/2012    CL 101 10/20/2012    CO2 30* 10/20/2012    UN 28* 10/20/2012    CREAT 1.19* 10/20/2012    VID25 30 01/07/2012    WBC 8.9 09/05/2012    HGB 11.0* 09/05/2012    HCT 35 09/05/2012    PLT 209 09/05/2012    TSH 0.62 09/22/2012    CHOL 150 04/23/2012    TRIG 161* 04/23/2012    HDL 40 04/23/2012    LDLC 78 04/23/2012    CHHDC 3.8 04/23/2012       Goals of Care: The goals of care are: Longevity    Advanced Directives:     No limitations on medical interventions      Assessment/Plan: 1.  Coronary artery disease with congestive heart failure.  She is back on Lasix 40 mg a day and seems to be tolerating it okay so that will be continued.  We will continue with amiodarone because of the arrhythmias in the past we will continue with aspirin 81 mg a day.  She remains on a low dose of carvedilol 6.25 mg twice a day and that will be continued.  She does have a history of hyperlipidemia and is on Lipitor because of her coronary artery disease and she has had borderline elevation of her liver enzymes which we are monitoring.  They have remained in the same range but they're still slightly elevated this may be related to her past history of alcohol use and the levels of elevation are not high enough that we need to discontinue the Lipitor at this point but we will need to continue monitoring this if her liver enzymes continue to rise we may need to consider taking her off the Lipitor.  I will be rechecking liver enzymes in the near future 2.  COPD.  This is well compensated at present she has when necessary guaifenesin ordered but does not require any inhalers at this time.  3.  Parkinsonism she is on Sinemet seems to be benefiting from that and tolerating it quite well we will continue with the Sinemet as ordered.      Follow-up: 2 months  Provider Signature:   Fernand Parkins, MD     Date: 10/28/2012 Time:   3:44 PM

## 2012-10-29 ENCOUNTER — Ambulatory Visit
Admit: 2012-10-29 | Discharge: 2012-10-29 | Disposition: A | Discharge status: Home / Self Care | Payer: Self-pay | Source: Ambulatory Visit | Attending: Geriatric Medicine | Admitting: Geriatric Medicine

## 2012-10-29 LAB — BASIC METABOLIC PANEL
Anion Gap: 13 (ref 7–16)
CO2: 29 mmol/L — ABNORMAL HIGH (ref 20–28)
Calcium: 8.6 mg/dL (ref 8.6–10.2)
Chloride: 103 mmol/L (ref 96–108)
Creatinine: 1.35 mg/dL — ABNORMAL HIGH (ref 0.51–0.95)
GFR,Black: 47 * — AB
GFR,Caucasian: 40 * — AB
Lab: 32 mg/dL — ABNORMAL HIGH (ref 6–20)
Potassium: 4.6 mmol/L (ref 3.3–5.1)
Sodium: 145 mmol/L (ref 133–145)

## 2012-10-29 LAB — HEPATIC FUNCTION PANEL
ALT: 25 U/L (ref 0–35)
AST: 55 U/L — ABNORMAL HIGH (ref 0–35)
Albumin: 3.7 g/dL (ref 3.5–5.2)
Alk Phos: 125 U/L — ABNORMAL HIGH (ref 35–105)
Bilirubin,Direct: <0.2 mg/dL (ref 0.0–0.3)
Bilirubin,Total: 0.3 mg/dL (ref 0.0–1.2)
Total Protein: 7.2 g/dL (ref 6.3–7.7)

## 2012-10-29 LAB — GLUCOSE: Glucose: 87 mg/dL (ref 74–106)

## 2012-11-14 ENCOUNTER — Ambulatory Visit
Admit: 2012-11-14 | Discharge: 2012-11-14 | Disposition: A | Discharge status: Home / Self Care | Payer: Self-pay | Source: Ambulatory Visit | Attending: Geriatric Medicine | Admitting: Geriatric Medicine

## 2012-11-14 LAB — BASIC METABOLIC PANEL
Anion Gap: 13 (ref 7–16)
CO2: 27 mmol/L (ref 20–28)
Calcium: 9.1 mg/dL (ref 8.6–10.2)
Chloride: 102 mmol/L (ref 96–108)
Creatinine: 1.21 mg/dL — ABNORMAL HIGH (ref 0.51–0.95)
GFR,Black: 53 * — AB
GFR,Caucasian: 46 * — AB
Lab: 31 mg/dL — ABNORMAL HIGH (ref 6–20)
Potassium: 4.3 mmol/L (ref 3.3–5.1)
Sodium: 142 mmol/L (ref 133–145)

## 2012-11-14 LAB — GLUCOSE: Glucose: 93 mg/dL (ref 74–106)

## 2012-12-03 ENCOUNTER — Inpatient Hospital Stay
Admit: 2012-12-03 | Disposition: A | Discharge status: Skilled Nursing Facility | Payer: Self-pay | Source: Skilled Nursing Facility | Attending: Vascular Surgery | Admitting: Vascular Surgery

## 2012-12-03 ENCOUNTER — Other Ambulatory Visit: Payer: Self-pay | Admitting: Gastroenterology

## 2012-12-03 ENCOUNTER — Encounter: Payer: Self-pay | Admitting: Emergency Medicine

## 2012-12-03 ENCOUNTER — Ambulatory Visit
Admit: 2012-12-03 | Discharge: 2012-12-03 | Disposition: A | Discharge status: Home / Self Care | Payer: Self-pay | Source: Ambulatory Visit | Attending: Geriatric Medicine | Admitting: Geriatric Medicine

## 2012-12-03 DIAGNOSIS — I96 Gangrene, not elsewhere classified: Secondary | ICD-10-CM | POA: Diagnosis present

## 2012-12-03 DIAGNOSIS — A419 Sepsis, unspecified organism: Secondary | ICD-10-CM | POA: Diagnosis present

## 2012-12-03 LAB — HEPATIC FUNCTION PANEL
ALT: 30 U/L (ref 0–35)
AST: 84 U/L — ABNORMAL HIGH (ref 0–35)
Albumin: 2.8 g/dL — ABNORMAL LOW (ref 3.5–5.2)
Alk Phos: 107 U/L — ABNORMAL HIGH (ref 35–105)
Bili,Indirect: 0.4 mg/dL
Bilirubin,Direct: 0.6 mg/dL — ABNORMAL HIGH (ref 0.0–0.3)
Bilirubin,Total: 1 mg/dL (ref 0.0–1.2)
Globulin: 3.9 g/dL (ref 2.7–4.3)
Total Protein: 6.7 g/dL (ref 6.3–7.7)

## 2012-12-03 LAB — BASIC METABOLIC PANEL
Anion Gap: 15 (ref 7–16)
CO2: 25 mmol/L (ref 20–28)
Calcium: 8.9 mg/dL (ref 8.6–10.2)
Chloride: 102 mmol/L (ref 96–108)
Creatinine: 2.19 mg/dL — ABNORMAL HIGH (ref 0.51–0.95)
GFR,Black: 26 * — AB
GFR,Caucasian: 23 * — AB
Lab: 82 mg/dL — ABNORMAL HIGH (ref 6–20)
Potassium: 3.8 mmol/L (ref 3.3–5.1)
Sodium: 142 mmol/L (ref 133–145)

## 2012-12-03 LAB — CBC AND DIFFERENTIAL
Baso # K/uL: 0 10*3/uL (ref 0.0–0.1)
Basophil %: 0 % (ref 0.1–1.2)
Eos # K/uL: 0 10*3/uL (ref 0.0–0.4)
Eosinophil %: 0 % — ABNORMAL LOW (ref 0.7–5.8)
Hematocrit: 33 % — ABNORMAL LOW (ref 34–45)
Hemoglobin: 11.1 g/dL — ABNORMAL LOW (ref 11.2–15.7)
Lymph # K/uL: 2.1 10*3/uL (ref 1.2–3.7)
Lymphocyte %: 8.7 % — ABNORMAL LOW (ref 19.3–51.7)
MCV: 87 fL (ref 79–95)
Mono # K/uL: 2.1 10*3/uL — ABNORMAL HIGH (ref 0.2–0.9)
Monocyte %: 8.7 % (ref 4.7–12.5)
Neut # K/uL: 19.7 10*3/uL — ABNORMAL HIGH (ref 1.6–6.1)
Nucl RBC # K/uL: 0 10*3/uL
Nucl RBC %: 0 /100 WBC (ref 0.0–0.2)
Platelets: 287 10*3/uL (ref 160–370)
RBC: 3.9 MIL/uL (ref 3.9–5.2)
RDW: 15.6 % — ABNORMAL HIGH (ref 11.7–14.4)
Seg Neut %: 80.9 % — ABNORMAL HIGH (ref 34.0–71.1)
WBC: 24.1 10*3/uL — ABNORMAL HIGH (ref 4.0–10.0)

## 2012-12-03 LAB — CRP: CRP: 568 mg/L — ABNORMAL HIGH (ref 0–10)

## 2012-12-03 LAB — MYELOCYTE: Myelocyte %: 1 % — ABNORMAL HIGH (ref 0–0)

## 2012-12-03 LAB — PROTIME-INR
INR: 1.2 (ref 1.0–1.2)
Protime: 13.2 s — ABNORMAL HIGH (ref 9.2–12.3)

## 2012-12-03 LAB — HOLD GREEN WITH GEL

## 2012-12-03 LAB — TYPE AND SCREEN
ABO RH Blood Type: A POS
Antibody Screen: NEGATIVE

## 2012-12-03 LAB — DIFF BASED ON: Diff Based On: 115 CELLS

## 2012-12-03 LAB — HOLD BLUE

## 2012-12-03 LAB — MISC. CELL %: Misc. Cell %: 0 % (ref 0–0)

## 2012-12-03 LAB — SEDIMENTATION RATE, AUTOMATED: Sedimentation Rate: 116 mm/hr — ABNORMAL HIGH (ref 0–30)

## 2012-12-03 LAB — GLUCOSE: Glucose: 109 mg/dL — ABNORMAL HIGH (ref 74–106)

## 2012-12-03 LAB — MANUAL DIFFERENTIAL

## 2012-12-03 LAB — BANDS: Bands %: 1 % (ref 0–10)

## 2012-12-03 LAB — LACTATE, VENOUS, WHOLE BLOOD: Lactate VEN,WB: 1.7 mmol/L (ref 0.5–2.2)

## 2012-12-03 MED ORDER — SODIUM CHLORIDE 0.9 % IV BOLUS *I*
1000.0000 mL | Freq: Once | Status: DC
Start: 2012-12-03 — End: 2012-12-03

## 2012-12-03 MED ORDER — VANCOMYCIN HCL IN D5W 1250 MG/250 ML IV SOLN *I*
1250.0000 mg | Freq: Once | INTRAVENOUS | Status: AC
Start: 2012-12-03 — End: 2012-12-03
  Administered 2012-12-03: 1250 mg via INTRAVENOUS
  Filled 2012-12-03: qty 300

## 2012-12-03 MED ORDER — SODIUM CHLORIDE 0.9 % IV BOLUS *I*
2000.0000 mL | Freq: Once | Status: DC
Start: 2012-12-03 — End: 2012-12-03

## 2012-12-03 MED ORDER — HEPARIN SODIUM 5000 UNIT/ML SQ *I*
SUBCUTANEOUS | Status: AC
Start: 2012-12-03 — End: 2012-12-03
  Administered 2012-12-03: 5000 [IU] via SUBCUTANEOUS
  Filled 2012-12-03: qty 1

## 2012-12-03 MED ORDER — LACTATED RINGERS IV SOLN *I*
125.0000 mL/h | INTRAVENOUS | Status: DC
Start: 2012-12-03 — End: 2012-12-04
  Administered 2012-12-03: 125 mL/h via INTRAVENOUS

## 2012-12-03 MED ORDER — MIDAZOLAM HCL 1 MG/ML IJ SOLN *I* WRAPPED
INTRAMUSCULAR | Status: AC
Start: 2012-12-03 — End: 2012-12-03
  Filled 2012-12-03: qty 2

## 2012-12-03 MED ORDER — ALBUTEROL SULFATE (2.5 MG/3ML) 0.083% IN NEBU *I*
2.5000 mg | INHALATION_SOLUTION | Freq: Once | RESPIRATORY_TRACT | Status: AC | PRN
Start: 2012-12-03 — End: 2012-12-03

## 2012-12-03 MED ORDER — ONDANSETRON HCL 2 MG/ML IV SOLN *I*
4.0000 mg | Freq: Once | INTRAMUSCULAR | Status: AC | PRN
Start: 2012-12-03 — End: 2012-12-03

## 2012-12-03 MED ORDER — SODIUM CHLORIDE 0.9 % IV BOLUS *I*
1000.0000 mL | Freq: Once | Status: AC
Start: 2012-12-03 — End: 2012-12-03
  Administered 2012-12-03: 1000 mL via INTRAVENOUS

## 2012-12-03 MED ORDER — FENTANYL CITRATE 50 MCG/ML IJ SOLN *WRAPPED*
25.0000 ug | INTRAMUSCULAR | Status: DC | PRN
Start: 2012-12-03 — End: 2012-12-04

## 2012-12-03 MED ORDER — PIPERACILLIN-TAZOBACTAM IN D5W 3.375 GM/50ML IV SOLN *I*
INTRAVENOUS | Status: DC
Start: 2012-12-03 — End: 2012-12-04
  Filled 2012-12-03: qty 50

## 2012-12-03 MED ORDER — SODIUM CHLORIDE 0.9 % IV BOLUS *I*
500.0000 mL | Freq: Once | Status: DC
Start: 2012-12-03 — End: 2012-12-03
  Administered 2012-12-03: 500 mL via INTRAVENOUS

## 2012-12-03 MED ORDER — GLYCOPYRROLATE 1 MG/5ML IJ SOLN *I*
INTRAMUSCULAR | Status: AC
Start: 2012-12-03 — End: 2012-12-03
  Filled 2012-12-03: qty 5

## 2012-12-03 MED ORDER — FENTANYL CITRATE 50 MCG/ML IJ SOLN *WRAPPED*
INTRAMUSCULAR | Status: AC
Start: 2012-12-03 — End: 2012-12-03
  Filled 2012-12-03: qty 4

## 2012-12-03 MED ORDER — HEPARIN SODIUM 5000 UNIT/ML SQ *I*
5000.0000 [IU] | Freq: Three times a day (TID) | SUBCUTANEOUS | Status: DC
Start: 2012-12-03 — End: 2012-12-16
  Administered 2012-12-04 – 2012-12-16 (×36): 5000 [IU] via SUBCUTANEOUS
  Filled 2012-12-03 (×36): qty 1

## 2012-12-03 MED ORDER — HALOPERIDOL LACTATE 5 MG/ML IJ SOLN *I*
0.5000 mg | Freq: Once | INTRAMUSCULAR | Status: AC | PRN
Start: 2012-12-03 — End: 2012-12-03

## 2012-12-03 MED ORDER — PIPERACILLIN-TAZOBACTAM IN D5W 4.5 GM/100ML IV SOLN *I*
4.5000 g | Freq: Once | INTRAVENOUS | Status: AC
Start: 2012-12-03 — End: 2012-12-03
  Administered 2012-12-03: 4.5 g via INTRAVENOUS
  Filled 2012-12-03: qty 100

## 2012-12-03 MED ORDER — MEPERIDINE HCL 25 MG/ML IJ SOLN *I*
12.5000 mg | INTRAMUSCULAR | Status: DC | PRN
Start: 2012-12-03 — End: 2012-12-04

## 2012-12-03 MED ORDER — ONDANSETRON HCL 2 MG/ML IV SOLN *I*
4.0000 mg | INTRAMUSCULAR | Status: DC | PRN
Start: 2012-12-03 — End: 2012-12-16

## 2012-12-03 MED ORDER — IPRATROPIUM-ALBUTEROL 0.5-2.5 MG/3ML IN SOLN *I*
3.0000 mL | Freq: Four times a day (QID) | RESPIRATORY_TRACT | Status: DC | PRN
Start: 2012-12-03 — End: 2012-12-16

## 2012-12-03 MED ORDER — SODIUM CHLORIDE 0.9 % IV SOLN WRAPPED *I*
100.0000 mL/h | Status: DC
Start: 2012-12-03 — End: 2012-12-03
  Administered 2012-12-03: 100 mL/h via INTRAVENOUS

## 2012-12-03 MED ORDER — PIPERACILLIN-TAZOBACTAM IN D5W 3.375 GM/50ML IV SOLN *I*
3.3750 g | Freq: Four times a day (QID) | INTRAVENOUS | Status: DC
Start: 2012-12-04 — End: 2012-12-12
  Administered 2012-12-04 – 2012-12-12 (×31): 3.375 g via INTRAVENOUS
  Filled 2012-12-03 (×37): qty 50

## 2012-12-03 MED ORDER — PROMETHAZINE HCL 25 MG/ML IJ SOLN *I*
12.5000 mg | Freq: Four times a day (QID) | INTRAMUSCULAR | Status: DC | PRN
Start: 2012-12-03 — End: 2012-12-04

## 2012-12-03 MED ORDER — SODIUM CHLORIDE 0.9 % IV SOLN WRAPPED *I*
125.0000 mL/h | Status: DC
Start: 2012-12-03 — End: 2012-12-04
  Administered 2012-12-03 – 2012-12-04 (×2): 125 mL/h via INTRAVENOUS

## 2012-12-03 MED ORDER — ROCURONIUM BROMIDE 10 MG/ML IV SOLN *WRAPPED*
Status: AC
Start: 2012-12-03 — End: 2012-12-03
  Filled 2012-12-03: qty 5

## 2012-12-03 MED ORDER — PROPOFOL 10 MG/ML IV EMUL (INTERMITTENT DOSING) WRAPPED *I*
INTRAVENOUS | Status: AC
Start: 2012-12-03 — End: 2012-12-03
  Filled 2012-12-03: qty 20

## 2012-12-03 MED ORDER — HYDROMORPHONE HCL PF 1 MG/ML IJ SOLN *WRAPPED*
0.5000 mg | INTRAMUSCULAR | Status: AC | PRN
Start: 2012-12-03 — End: 2012-12-10
  Administered 2012-12-04 – 2012-12-10 (×10): 0.5 mg via INTRAVENOUS
  Filled 2012-12-03 (×8): qty 1

## 2012-12-03 NOTE — H&P (Signed)
Hospital Medicine H&P    Chief Complaint: Left foot wound    HPI:  67 y.o. female with an extensive past medical history as detailed below who presents from [facility] nursing home for evaluation of a left foot wound.  Per discussion with the [facility] nursing staff, Mrs. 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  The exact time course of illness is difficult to assess as Ms. Lorenzen is a poor historian and documentation from the nursing home is unclear.  They do report that over the past week the foot has grown more black and malodorous, prompting further evaluation.  Ms. Robie reports only mild pain in the left foot, and says that otherwise she is asymptomatic.    Interval History:  In the ED she was hypotensive on arrival with SBP in the 80's in the setting of a necrotic left foot.  She was given 2L IVF bolus.  She was found to have an elevated WBC, CRP, ERP, and creatinine but a normal lactate.  She was given Vancomycin and Zosyn.    Review of Systems:   Denies fevers, chills, lightheadedness, dizziness, chest pain, palpitations, shortness of breath, cough, abdominal pain, nausea, or vomiting.  Does endorse worsening left foot pain and odor.    Past Medical and Surgical History:  Past Medical History   Diagnosis Date   . CVA (cerebral infarction) 10/23/2011   . ASHD (arteriosclerotic heart disease) 10/23/2011   . MI (mitral incompetence) 10/23/2011   . CHF (congestive heart failure) 10/23/2011   . COPD (chronic obstructive pulmonary disease) 10/23/2011   . ETOH abuse 10/23/2011   . VT (ventricular tachycardia) 10/23/2011     S/p AICD   . Anemia of chronic disease 10/23/2011   . HTN (hypertension) 10/23/2011   . Parkinsonism    . Unspecified cerebral artery occlusion with cerebral infarction    . Coronary artery disease    . CHF (congestive heart failure)    . COPD (chronic obstructive pulmonary disease)    . AICD (automatic  cardioverter/defibrillator) present    . Hypertension    . Myocardial infarction june 2013   . CHF (congestive heart failure)      EF 30%   . Alcohol abuse, in remission    . Anemia    . HTN (hypertension)    . Ventricular tachycardia      Past Surgical History   Procedure Laterality Date   . Icd     . Hemicolectomy Left    . Cardiac defibrillator placement     . Pacemaker insertion         Medications:  Home Meds:   (Not in a hospital admission)  Scheduled Meds:  . sodium chloride 0.9%  bolus  1,000 mL Intravenous Once     Continuous Infusions:  . sodium chloride 100 mL/hr (12/03/12 1934)     PRN Meds:.ipratropium-albuterol    Allergies:  No Known Allergies (drug, envir, food or latex)    Social History:  History     Social History   . Marital Status: Widowed     Spouse Name: N/A     Number of Children: N/A   . Years of Education: N/A     Occupational History   . Not on file.     Social History Main Topics   . Smoking status: Unknown If Ever Smoked   . Smokeless tobacco: Not on  file   . Alcohol Use: No      Comment: formerly, heavy drinker   . Drug Use: Not on file   . Sexual Activity: Not on file     Other Topics Concern   . Not on file     Social History Narrative    ** Merged History Encounter **            Family History:  History reviewed. No pertinent family history.    Physical Exam:  BP: (80-98)/(36-54)   Temp:  [36 C (96.8 F)]   Temp src:  [-]   Heart Rate:  [64-82]   Resp:  [15-18]   SpO2:  [93 %-100 %]   Weight:  [63.8 kg (140 lb 10.5 oz)]  No data found.  ]   Intake/Output Summary (Last 24 hours) at 12/03/12 1941  Last data filed at 12/03/12 1846   Gross per 24 hour   Intake    500 ml   Output   2000 ml   Net  -1500 ml        General: NAD, resting in bed, visible head tremor, somewhat slow to speak, room is very malodorous  HEENT: dry MM, no O/P lesions  Neck: supple, JVP non-elevated  Cardiac: RRR, S1S2, no M/G/R  Lungs: some mild crackles in lower lung fields  Abdomen: soft, nontender, nondistended,  normal bowel sounds  Extremities: left foot is necrotic and malodorous to about the ankle with denuded skin, some streaking noted up the left calf, left foot is cold, minimal popliteal pulse, positive femoral pulse, area of skin abrasion on the left thigh as well, no other wounds noted, RLE with palpable distal pulses  Neuro: grossly intact, no deficits     Labs:    Recent Labs  Lab 12/03/12  1113   WBC 24.1*   Hemoglobin 11.1*   Hematocrit 33*   Platelets 287       Recent Labs  Lab 12/03/12  1619   Protime 13.2*   INR 1.2      Recent Labs  Lab 12/03/12  1113   Sodium 142   Potassium 3.8   Chloride 102   CO2 25   UN 82*   Creatinine 2.19*      No results found for this basename: TROP,  CKMBS,  in the last 168 hours    Recent Labs  Lab 12/03/12  1619   Total Protein 6.7     Lactate 1.7  CRP 568  ESR 116    Micro:  Aerobic Culture   Date Value Range Status   07/15/2012 Yeast   Final       20,000/ml   07/15/2012 Yeast   Final       60,000/ml        Bacterial Blood Culture   Date Value Range Status   12/03/2012 .   Preliminary     Blood cultures - pending    ECG: paced rhythm    Telemetry: not on at time of exam    Radiology:   CXR - (per my read) with some pulmonary edema, AICD in place    Foot x-ray - pending radiology interpretation    Doppler - done by surgery team, LLE had flow up to the popliteal artery    ASSESSMENT:  Active Hospital Problems    Diagnosis   . Gangrene of foot   . Sepsis      Resolved Hospital Problems    Diagnosis   No  resolved problems to display.       67 y.o. female with extensive medical co-morbidities including COPD, CHF (EF 30%), HTN, CAD, CVA, Parkinsonism, and VT (s/p AICD placements) who presents with sepsis from gangrene of the left foot.    PLAN:  1) SEPSIS FROM GANGRENE OF THE LEFT FOOT  - left foot is visibly necrotic and malodorous  - vascular surgery team consulted to see patient --> recommending amputation as life saving measure,patient is agreeable to this --> planning on OR tonight  for urgent amputation  - currently mentating well but with soft BP, elevated WBC count/CRP/ESR but lactate normal at 1.7  - received vancomycin/zosyn in the ED --> will defer further antibiotic choice to vascular surgery for now  - continue aggressive fluid resuscitation to maintain SBP's >90, additional fluid bolus now, started on NS at 100 for now  - patient is at great risk of acute decompensation  - NPO, type and screen, PT/INR  - will follow blood cultures    2) AKI  - creatinine acutely elevated in the setting of sepsis and recent poor PO intake - up from baseline of about 1.2  - will trend with daily BMP's    3) CHF  - documented EF of 30% but no recent echo  - CXR with some evidence of pulmonary edema, but saturating well on room air and no respiratory distress  - patient indicated that she was agreeable to using non-invasive ventilation if necessary in the setting of aggressive fluid resuscitation    4) COPD  - duonebs PRN    5) CARDIAC DISEASE WITH AICD PLACEMENT  - anesthesia to be notified of AICD in place  - holding all home meds - includes amiodarone, lasix and coreg    6) PARKINSONISM  - unclear what her mental capacity is, may have an element of dementia but seems capable to make her own health care decisions  - hold sinemet    DVT PPX: per surgery team    F: NS @100    E: daily BMP  N: NPO    CODE: FULL CODE WITH TRIAL OF INTUBATION --> changed from DNR/DNI after further discussion with surgery team and son     Jodelle Gross. Sueanne Margarita, MD  Family Medicine, R2  Pager [phone], [place] [phone]

## 2012-12-03 NOTE — INTERIM OP NOTE (Addendum)
Interim Operative Note    Date of Surgery: 12/03/2012   Surgeon: Vincent Gros, MD  First Assistant: Marisue Humble, MD   Second Assistant: none    Pre-Op Diagnosis: left foot wet gangrene with sepsis    Anesthesia Type: General    Post-Op Diagnosis: same as above    Additional Findings (Including unexpected complications): none    Procedure(s) Performed (including CPT 4 Code if available)  Left leg guillotine amputation ([address])         Estimated Blood Loss: 50  cc  Packing: No  Drains: No  Fluid Totals: Intakes: 900cc Outputs: 150cc  Specimens to Pathology: yes  Patient Condition: good  Disposition: inpatient ICU status - no ICU beds available; will go to telemetry unit with ICU provider to help with vent management and critical care    Author: Marisue Humble, MD  as of: 12/03/2012  at: 10:14 PM    Vascular Quality Initiative - Lower Extremity Amputation Procedure Notes    Urgency: Emergent     Anesthesia: General    Current Amputation Side: left    Level:  BKA     Indication: Uncontrolled Infection  --------------------------------------------------    Incision:  Open         Dressing: Gauze Only             Planned Staged Amputation: yes          (If more than one amputation side - please add an additional [place] SUR VASC VQI AMP MULTIPLE to document additional amputation side information)    EBL: 25 ml  Total Procedure Time: 30 minutes    Heart Rate:   On Arrival in OR: 60 bpm  Highest intra-op: 75 bpm

## 2012-12-03 NOTE — ED Notes (Signed)
Pt to OR for amputation. BP fairly stable after 4.5L fluid bolus. Updated with pt's sister at bedside before pt leaving. Pt's belongs with her.

## 2012-12-03 NOTE — Anesthesia Pre-procedure Eval (Signed)
Anesthesia Pre-operative Evaluation for Michelle [name]      Health History  Past Medical History   Diagnosis Date   . CVA (cerebral infarction) 10/23/2011   . ASHD (arteriosclerotic heart disease) 10/23/2011   . MI (mitral incompetence) 10/23/2011   . CHF (congestive heart failure) 10/23/2011     EF 30%   . COPD (chronic obstructive pulmonary disease) 10/23/2011   . VT (ventricular tachycardia) 10/23/2011     S/p AICD   . Anemia of chronic disease 10/23/2011   . HTN (hypertension) 10/23/2011   . Parkinsonism    . Coronary artery disease    . AICD (automatic cardioverter/defibrillator) present    . Myocardial infarction june 2013   . Alcohol abuse, in remission      Past Surgical History   Procedure Laterality Date   . Icd     . Hemicolectomy Left    . Cardiac defibrillator placement     . Pacemaker insertion       Social History  History   Substance Use Topics   . Smoking status: Unknown If Ever Smoked   . Smokeless tobacco: Not on file   . Alcohol Use: No      Comment: formerly, heavy drinker      History   Drug Use Not on file     ______________________________________________________________________  Allergies: No Known Allergies (drug, envir, food or latex)  Prior to Admission Medications    Last Medication Reconciliation Action:  Requires Completion Salem Senate, MD 12/03/2012  4:17 PM              Last Dose Start Date End Date Provider     Cholecalciferol (VITAMIN D3) 50000 UNITS CAPS   --  --  [provider]     HYDROcodone-acetaminophen (NORCO) 5-325 MG per tablet   --  --  [provider]     acetaminophen (TYLENOL) 500 mg tablet   07/25/12  --  Romesser, Teressa Lower, MD     Take 2 tablets (1,000 mg total) by mouth 3 times daily     amiodarone (PACERONE) 400 MG tablet   --  --  [provider]     aspirin 81 MG EC tablet   --  --  [provider]     atorvastatin (LIPITOR) 10 MG tablet   --  --  [provider]     bisacodyl (DULCOLAX) 10 MG suppository   --  --   [provider]     calcium carbonate-vitamin D 600-400 MG-UNIT per tablet   --  --  [provider]     carbidopa-levodopa (SINEMET) 25-100 MG per tablet   --  --  [provider]     carvedilol (COREG) 6.25 MG tablet   07/25/12  --  Romesser, Teressa Lower, MD     Take 0.5 tablets (3.125 mg total) by mouth daily   Hold for sbp < 110     cyanocobalamin (VITAMIN B-12) 1000 MCG tablet   --  --  [provider]     famotidine (PEPCID) 20 MG tablet   --  --  [provider]     furosemide (LASIX) 20 MG tablet (Expired)   07/25/12  08/24/12  Romesser, Teressa Lower, MD     Take 1 tablet (20 mg total) by mouth every morning     guaiFENesin (ROBITUSSIN) 100 MG/5ML syrup   --  --  [provider]     ipratropium-albuterol (DUONEB)  0.5-2.5 (3) MG/3ML nebulizer solution   --  --  [provider]     potassium chloride SA (KLOR-CON M20) 20 mEq CR tablet   07/25/12  01/21/13  Romesser, Teressa Lower, MD     Take 1 tablet (20 mEq total) by mouth daily     sertraline (ZOLOFT) 50 MG tablet   --  --  [provider]     sorbitol 70 % solution   --  --  [provider]          Admission Medications:  Scheduled Meds IV Meds  . sodium chloride 100 mL/hr (12/03/12 1934)   PRN Meds  . ipratropium-albuterol       Anesthesia Evaluation Information Source: per patient, per records          PULMONARY    + COPD            moderate CARDIOVASCULAR    + Hypertension            well controlled    + CAD    + Valvular Disease            MR    + Dysrhythmias            tachy    + Cardiac device            AICD    + CHF    GI/HEPATIC/RENAL  Last PO Intake: >2hr before procedure (clears)      + Alcohol use    + Renal issues          AKI     NEURO/PSYCH    + Cerebrovascular event    + Neuromuscular disease          Parkinson's             Nursing Reported PO Status:           ______________________________________________________________________  Physical Exam    Airway            Mouth  opening: normal            Mallampati: II            TM distance (fb): >3 FB            Neck ROM: limited  Dental    Upper: all teeth missing except marked Lower: all teeth missing except marked   Cardiovascular  Normal Exam    General Survey    Normal Exam   Pulmonary   pulmonary exam normal    Mental Status   Normal  evaluation         Most Recent Vitals: BP:  (Dr. Merdis Delay notified) (12/03/12 2047)  BP MAP : 43 mmHg (12/03/12 1956)  Heart Rate: 66 (12/03/12 2047)  Temp: 36.8 C (98.2 F) (12/03/12 2047)  Resp: 18 (12/03/12 2047)  Weight: 63.8 kg (140 lb 10.5 oz) (12/03/12 1639)  SpO2: 100 % (12/03/12 2028)    Vital Sign Ranges (last 24hrs)  Temp:  [36 C (96.8 F)-36.8 C (98.2 F)] 36.8 C (98.2 F)  Heart Rate:  [64-82] 66  Resp:  [14-18] 18  BP: (78-112)/(36-54) 78/49 mmHg   O2 Device: None (Room air) (12/03/12 2047)    Most Recent Lab Results   Blood Type  Lab Results   Component Value Date    ABORH A RH POS 12/03/2012    ABS Negative 12/03/2012   CBC  Lab Results   Component Value Date  WBC 24.1* 12/03/2012    HCT 33* 12/03/2012    PLT 287 12/03/2012   Chem-7  Lab Results   Component Value Date    NA 142 12/03/2012    K 3.8 12/03/2012    CL 102 12/03/2012    CO2 25 12/03/2012    UN 82* 12/03/2012    CREAT 2.19* 12/03/2012    GLU 101* 09/03/2012    PGLU 104* 07/23/2012   The CrCl is unknown because both a height and weight (above a minimum accepted value) are required for this calculation.  Electrolytes  Lab Results   Component Value Date    CA 8.9 12/03/2012    MG 1.5 07/18/2012    PO4 1.7* 07/18/2012   Coags  Lab Results   Component Value Date    PTI 13.2* 12/03/2012    INR 1.2 12/03/2012   LFTs  Lab Results   Component Value Date    AST 84* 12/03/2012    ALT 30 12/03/2012    ALK 107* 12/03/2012     Bilirubin,Direct   Date Value Range Status   12/03/2012 0.6* 0.0 - 0.3 mg/dL Final        Bili,Indirect   Date Value Range Status   12/03/2012 0.4   Final        Bilirubin,Total   Date Value Range Status    12/03/2012 1.0  0.0 - 1.2 mg/dL Final     Pregnancy Status: Postmenopausal [2]  No LMP recorded. Patient is postmenopausal.    No results found for this basename: PUPT, UPREG, SPREG, HCG1, BHCG2, BHCG, HCGB     ECG Results  Lab Results   Component Value Date/Time    RATE 60 07/15/2012  1:44 PM    STATEMENT ATRIAL-VENTRICULAR DUAL-PACED RHYTHM 07/15/2012  1:44 PM     ANES CPM    Radiology: No relevant studies     ________________________________________________________________________  Medical Problems  Patient Active Problem List    Diagnosis Date Noted   . Sepsis 12/03/2012   . Gangrene of foot 12/03/2012   . C. difficile colitis 07/25/2012   . Compression fracture of L1 lumbar vertebra 07/25/2012   . AKI (acute kidney injury) 07/15/2012   . Unspecified cerebral artery occlusion with cerebral infarction    . Coronary artery disease    . CHF (congestive heart failure)    . COPD (chronic obstructive pulmonary disease)    . AICD (automatic cardioverter/defibrillator) present    . Hypertension    . Parkinsonism    . CVA (cerebral infarction) 10/23/2011   . ASHD (arteriosclerotic heart disease) 10/23/2011   . MI (mitral incompetence) 10/23/2011   . CHF (congestive heart failure) 10/23/2011   . COPD (chronic obstructive pulmonary disease) 10/23/2011   . ETOH abuse 10/23/2011   . VT (ventricular tachycardia) 10/23/2011     S/p AICD     . Anemia of chronic disease 10/23/2011   . HTN (hypertension) 10/23/2011       PreOp/PreProcedure Diagnosis (For more detail see procedural consent)            Gangrene left foot  Planned Procedure (For more detail see procedural consent)            Amputation left foot  Plan   ASA Score 4  Anesthetic Plan (general); Induction (routine IV); Airway (cuffed ETT); Line ( use current access); Monitoring (standard ASA); Positioning (supine); Pain (per surgical team); PostOp (PACU or ICU)    Informed Consent     Risks:  Risks discussed were commensurate with the plan listed above with the  following specific points:  N/V, aspiration, sore throat , damage to:(eyes, nerves, teeth), awareness, unexpected serious injury, allergic Rx    Anesthetic Consent:         Anesthetic plan and risks discussed with:  patient      Attending Attestation: The patient or proxy understand and accept the risks and benefits of the anesthesia plan. By accepting this note, I attest that I have personally performed the history and physical exam and prescribed the anesthetic plan within 48 hours prior to the anesthetic as documented by me above.    Author: Rolena Infante, MD

## 2012-12-03 NOTE — Provider Consult (Signed)
Vascular Surgery Consultation Note    Patient: Michelle [name]  LOS: 0 days  Attending: Vincent Gros             HPI: Michelle [name] is a 67 y.o. female with an extensive past medical history who presents from her nursing home with left leg pain. Patient is a poor historian and most of her clinical history is obtained from the chart and patient's nursing home. 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 This has been managed by the nursing home as of recent. She reports having seen someone in the past for this but can not state who. The most recent documentation seen in her paper chart from her nursing home is of 9/30 were the wound is classified as a Stage 3/4 ulcer. She presents today for leg pain. On evaluation she was found to have a necrotic left foot, malodorous and cold. She has been hypotensive while in the ED. Laboratory values significant for an elevated creatinine of 2.19 and a WBC of 24.1.  Per nursing home care taker, patient has been complaining of foot pain since Monday. She denies the patient having ischemic signs other than chronic non-healing ulcer.     Past Medical History:   Past Medical History   Diagnosis Date   . CVA (cerebral infarction) 10/23/2011   . ASHD (arteriosclerotic heart disease) 10/23/2011   . MI (mitral incompetence) 10/23/2011   . CHF (congestive heart failure) 10/23/2011   . COPD (chronic obstructive pulmonary disease) 10/23/2011   . ETOH abuse 10/23/2011   . VT (ventricular tachycardia) 10/23/2011     S/p AICD   . Anemia of chronic disease 10/23/2011   . HTN (hypertension) 10/23/2011   . Parkinsonism    . Unspecified cerebral artery occlusion with cerebral infarction    . Coronary artery disease    . CHF (congestive heart failure)    . COPD (chronic obstructive pulmonary disease)    . AICD (automatic cardioverter/defibrillator) present    . Hypertension    . Myocardial infarction june 2013   . CHF (congestive heart failure)      EF 30%   .  Alcohol abuse, in remission    . Anemia    . HTN (hypertension)    . Ventricular tachycardia        Past Surgical History:   Past Surgical History   Procedure Laterality Date   . Icd     . Hemicolectomy Left    . Cardiac defibrillator placement     . Pacemaker insertion         Medications:      Allergies:   No Known Allergies (drug, envir, food or latex)    Family History:   family history is not on file.    Social History:   History   Substance Use Topics   . Smoking status: Unknown If Ever Smoked   . Smokeless tobacco: Not on file   . Alcohol Use: No      Comment: formerly, heavy drinker        Review of Systems: As above, otherwise 12 point review of systems negative    Physical Examination:  Vitals:  Temp:  [36 C (96.8 F)] 36 C (96.8 F)  Heart Rate:  [64-82] 65  Resp:  [15-18] 16  BP: (80-91)/(36-54) 86/36 mmHg    Weight: 63.8 kg (140 lb 10.5 oz)  General appearance: alert. No distress.   Head: Normocephalic,  atraumatic, face symmetric  Lungs: nonlabored respirations  Heart: RRR  Abdomen: soft, nondistended, non-tender.   Extremities:   Left lower extremity: Pictures of extremity down below. Entire left foot black, necrotic with areas of skin denuding. Malodorous. Cold to touch.   Right lower extremity: No significant skin changes or wounds.   Neuro: alert, oriented, no focal deficits  Vascular:   LLE: Palpable femoral and popliteal pulses (+1). Bedside US performed showing flow up to popliteal artery.   RLE: Palpable DP pulse (+1)            Lab Results:  Laboratory values:   Recent Labs      12/03/12   1619  12/03/12   1113   WBC   --   24.1*   Hemoglobin   --   11.1*   Hematocrit   --   33*   Platelets   --   287   Sedimentation Rate  116*   --    CRP  568*   --     Recent Labs      12/03/12   1113   Sodium  142   Potassium  3.8   Chloride  102   CO2  25   UN  82*   Creatinine  2.19*   Calcium  8.9    No results found for this basename: AST, ALT, ALK, TB, DB, TP, AMY, LIP, ALB, PALB,  in the last 72  hours   Imaging:   No results found.     ASSESSMENT & RECOMMENDATIONS    Michelle [name] is a 67 y.o. female with an extensive past medical history who presents necrotic left foot likely 2/2 acute on chronic ischemia. Patient is septic with hypotension and elevated WBC. She will require urgent amputation.    -- To OR for urgent left foot amputation.   -- Vanc/Zosyn given in the ED.   -- Patient has received 2 L of crystalloid. Another NS bolus ordered.   -- Informed consent obtained from patient-- signed by son as patient unable to provide signature.   -- Of note, patient was DNR/DNI at nursing home. After further discussion she has been made full code with a trial intubation period.       Author: Glenford Bayley, MD as of: 12/03/2012  at: 6:07 PM

## 2012-12-03 NOTE — Progress Notes (Signed)
Utilization Management    Level of Care Inpatient as of the date 12/03/12      Demonie Kassa, RN     Pager: [phone]

## 2012-12-03 NOTE — ED Provider Notes (Signed)
History     Chief Complaint   Patient presents with   . Fatigue   . Hypotension   . Abnormal Lab     HPI Comments: Michelle [name] is a 67 y.o. female with h/o CHF, HTN, CAD, prior MI, COPD - she presents with a black foot. She has had a chronic ulcer on her left foot which has gradually been worsening, now the tissue is black, refractory to wound care. Associated with a severely elevated leukocytosis today. Referred to [facility] ED by PCP.      Past Medical History   Diagnosis Date   . CVA (cerebral infarction) 10/23/2011   . ASHD (arteriosclerotic heart disease) 10/23/2011   . MI (mitral incompetence) 10/23/2011   . CHF (congestive heart failure) 10/23/2011   . COPD (chronic obstructive pulmonary disease) 10/23/2011   . ETOH abuse 10/23/2011   . VT (ventricular tachycardia) 10/23/2011     S/p AICD   . Anemia of chronic disease 10/23/2011   . HTN (hypertension) 10/23/2011   . Parkinsonism    . Unspecified cerebral artery occlusion with cerebral infarction    . Coronary artery disease    . CHF (congestive heart failure)    . COPD (chronic obstructive pulmonary disease)    . AICD (automatic cardioverter/defibrillator) present    . Hypertension    . Myocardial infarction june 2013   . CHF (congestive heart failure)      EF 30%   . Alcohol abuse, in remission    . Anemia    . HTN (hypertension)    . Ventricular tachycardia             Past Surgical History   Procedure Laterality Date   . Icd     . Hemicolectomy Left    . Cardiac defibrillator placement     . Pacemaker insertion         History reviewed. No pertinent family history.      Social History      reports that she does not drink alcohol. Her tobacco, drug, and sexual activity histories are not on file.    Living Situation    Questions Responses    Patient lives with SNF    Homeless     Caregiver for other family member     External Services     Employment     Domestic Violence Risk           Problem List     Patient Active Problem List   Diagnosis Code   . AKI (acute kidney  injury) 584.9   . Unspecified cerebral artery occlusion with cerebral infarction 434.91   . Coronary artery disease 414.00   . CHF (congestive heart failure) 428.0   . COPD (chronic obstructive pulmonary disease) 496   . AICD (automatic cardioverter/defibrillator) present V45.02   . Hypertension 401.9   . CVA (cerebral infarction) 434.91   . ASHD (arteriosclerotic heart disease) 414.00   . MI (mitral incompetence) 424.0   . CHF (congestive heart failure) 428.0   . COPD (chronic obstructive pulmonary disease) 496   . ETOH abuse 305.00   . VT (ventricular tachycardia) 427.1   . Anemia of chronic disease 285.29   . HTN (hypertension) 401.9   . Parkinsonism 332.0   . C. difficile colitis 008.45   . Compression fracture of L1 lumbar vertebra 805.4       Review of Systems   Review of Systems   Unable to perform ROS: Dementia  Physical Exam     ED Triage Vitals   BP Heart Rate Heart Rate(via Pulse Ox) Resp Temp Temp Source SpO2 O2 Device O2 Flow Rate   12/03/12 1535 12/03/12 1535 -- 12/03/12 1535 12/03/12 1535 12/03/12 1535 12/03/12 1535 12/03/12 1535 --   91/54 mmHg 75  16 36 C (96.8 F) TEMPORAL 93 % None (Room air)       Weight           --                          Physical Exam   Nursing note and vitals reviewed.  Constitutional: She appears well-developed and well-nourished. No distress.   Lying in bed, attentive with gaze, appears comfortable.   HENT:   Head: Normocephalic and atraumatic.   Right Ear: External ear normal.   Left Ear: External ear normal.   Nose: Nose normal.   Dry membranes.   Eyes: Conjunctivae are normal. Right eye exhibits no discharge. Left eye exhibits no discharge. No scleral icterus.   Neck: Normal range of motion. Neck supple.   Cardiovascular: Normal rate, regular rhythm, normal heart sounds and intact distal pulses.    Pulmonary/Chest: Effort normal and breath sounds normal. No respiratory distress.   Abdominal: Soft. She exhibits no distension. There is no guarding.    Musculoskeletal:   Left foot is necrotic and exceptionally pungent. Unable to palpate DP/PT pulses.   Neurological: She is alert.   Skin: Skin is warm and dry. No rash noted. She is not diaphoretic.   Psychiatric: Her behavior is normal.       Medical Decision Making      Amount and/or Complexity of Data Reviewed  Clinical lab tests: ordered and reviewed  Tests in the radiology section of CPT: ordered and reviewed  Decide to obtain previous medical records or to obtain history from someone other than the patient: yes  Review and summarize past medical records: yes  Discuss the patient with other providers: yes  Independent visualization of images, tracings, or specimens: yes        Initial Evaluation:  ED First Provider Contact    Date/Time Event User Comments    12/03/12 1604 ED Provider First Contact Mariadejesus Cade Initial Face to Face Provider Contact          Patient seen by me as above    Assessment:  67 y.o., female comes to the ED with a necrotic foot in the setting of a serious leukocytosis. Labs drawn today also demonstrate an AKI. This foot appears to be necrosing and may require amputation in order to prevent life-threatening sepsis, which is likely impending. Will initiate broad spectrum IV abx and admit. Vascular surgery team consulted in order to consider conducting a therapeutic amputation. She is moderately hypotensive with a SBP in the 90s - will bolus. Vitals are otherwise normal.    Differential Diagnosis includes necrotic foot; sepsis, bacteremia, leukocytosis, AKI, other metabolic disturbance, dehydration.              Plan:   Diagnostics: cbc, bmp, esr/crp, foot xray  Therapeutic: IV vanc/zosyn, IVFs  Disposition: admit, vascular surgery c/s      Salem Senate, MD          Salem Senate, MD  12/03/12 1714

## 2012-12-03 NOTE — Progress Notes (Signed)
UPDATES TO PATIENT'S CONDITION on the DAY OF SURGERY/PROCEDURE    I. Updates to Patient's Condition (to be completed by a provider privileged to complete a H&P, following reassessment of the patient by the provider):    Emergently to OR for removal for devitalized tissue. Risk of death discussed with patient and family. They wish to proceed.    II. Procedure Readiness   I have reviewed the patient's H&P and updated condition. By completing and signing this form, I attest that this patient is ready for surgery/procedure.      III. Attestation   I have reviewed the updated information regarding the patient's condition and it is appropriate to proceed with the planned surgery/procedure.    Vincent Gros, MD as of 7:14 PM 12/03/2012

## 2012-12-03 NOTE — ED Notes (Signed)
Bed: PA-03  Expected date: 12/03/12  Expected time: 2:46 PM  Means of arrival:   Comments:  Michelle, [name] 1945/09/03 Called in by Leonie Green [phone]. Sent by [facility] for lethargy, decreased po. WBC 24, and noted ARF. Known wound to left foot. Noted new wound to dorsal aspect of left foot.

## 2012-12-04 DIAGNOSIS — I96 Gangrene, not elsewhere classified: Principal | ICD-10-CM | POA: Diagnosis present

## 2012-12-04 LAB — BASIC METABOLIC PANEL
Anion Gap: 11 (ref 7–16)
CO2: 21 mmol/L (ref 20–28)
Calcium: 7.4 mg/dL — ABNORMAL LOW (ref 8.6–10.2)
Chloride: 112 mmol/L — ABNORMAL HIGH (ref 96–108)
Creatinine: 1.52 mg/dL — ABNORMAL HIGH (ref 0.51–0.95)
GFR,Black: 40 * — AB
GFR,Caucasian: 35 * — AB
Glucose: 102 mg/dL — ABNORMAL HIGH (ref 60–99)
Lab: 60 mg/dL — ABNORMAL HIGH (ref 6–20)
Potassium: 3.2 mmol/L — ABNORMAL LOW (ref 3.3–5.1)
Sodium: 144 mmol/L (ref 133–145)

## 2012-12-04 LAB — CBC AND DIFFERENTIAL
Baso # K/uL: 0 10*3/uL (ref 0.0–0.1)
Basophil %: 0 % (ref 0.1–1.2)
Eos # K/uL: 0 10*3/uL (ref 0.0–0.4)
Eosinophil %: 0 % — ABNORMAL LOW (ref 0.7–5.8)
Hematocrit: 26 % — ABNORMAL LOW (ref 34–45)
Hemoglobin: 8.3 g/dL — ABNORMAL LOW (ref 11.2–15.7)
Lymph # K/uL: 0.6 10*3/uL — ABNORMAL LOW (ref 1.2–3.7)
Lymphocyte %: 3 % — ABNORMAL LOW (ref 19.3–51.7)
MCV: 86 fL (ref 79–95)
Mono # K/uL: 0.8 10*3/uL (ref 0.2–0.9)
Monocyte %: 4 % — ABNORMAL LOW (ref 4.7–12.5)
Neut # K/uL: 18.5 10*3/uL — ABNORMAL HIGH (ref 1.6–6.1)
Platelets: 235 10*3/uL (ref 160–370)
RBC: 3.1 MIL/uL — ABNORMAL LOW (ref 3.9–5.2)
RDW: 16.1 % — ABNORMAL HIGH (ref 11.7–14.4)
Seg Neut %: 92 % — ABNORMAL HIGH (ref 34.0–71.1)
WBC: 20.1 10*3/uL — ABNORMAL HIGH (ref 4.0–10.0)

## 2012-12-04 LAB — CLOSTRIDIUM DIFFICILE EIA: C difficile Toxins A and B EIA: 0

## 2012-12-04 LAB — URINALYSIS WITH MICROSCOPIC
Ketones, UA: NEGATIVE
Leuk Esterase,UA: NEGATIVE
Nitrite,UA: NEGATIVE
Protein,UA: NEGATIVE mg/dL
Specific Gravity,UA: 1.015 (ref 1.001–1.030)
WBC,UA: NONE SEEN /hpf (ref 0–5)
pH,UA: 5 (ref 5.0–8.0)

## 2012-12-04 LAB — PROTIME-INR
INR: 1.3 — ABNORMAL HIGH (ref 1.0–1.2)
INR: 1.3 — ABNORMAL HIGH (ref 1.0–1.2)
Protime: 13.4 s — ABNORMAL HIGH (ref 9.2–12.3)
Protime: 13.7 s — ABNORMAL HIGH (ref 9.2–12.3)

## 2012-12-04 LAB — METAMYELOCYTE: Metamyelocyte %: 1 % (ref 0–1)

## 2012-12-04 LAB — ANISOCYTOSIS

## 2012-12-04 LAB — MAGNESIUM
Magnesium: 1.7 mEq/L (ref 1.3–2.1)
Magnesium: 1.6 mEq/L (ref 1.3–2.1)

## 2012-12-04 LAB — PHOSPHORUS
Phosphorus: 3.1 mg/dL (ref 2.7–4.5)
Phosphorus: 2.4 mg/dL — ABNORMAL LOW (ref 2.7–4.5)

## 2012-12-04 LAB — C DIFFICILE TOXIN B PCR: C difficile toxin B PCR: POSITIVE

## 2012-12-04 LAB — MACROCYTIC

## 2012-12-04 LAB — VANCOMYCIN, RANDOM: Vancomycin Level: 9.4 ug/mL

## 2012-12-04 LAB — DIFF BASED ON: Diff Based On: 100 CELLS

## 2012-12-04 LAB — TARGET CELLS

## 2012-12-04 LAB — MANUAL DIFFERENTIAL

## 2012-12-04 LAB — SLIDE NUMBER: Slide # (Heme): 1144

## 2012-12-04 MED ORDER — SENNOSIDES 8.6 MG PO TABS *I*
1.0000 | ORAL_TABLET | Freq: Two times a day (BID) | ORAL | Status: DC
Start: 2012-12-04 — End: 2012-12-07
  Administered 2012-12-04 – 2012-12-06 (×3): 1 via ORAL
  Filled 2012-12-04 (×5): qty 1

## 2012-12-04 MED ORDER — OXYCODONE HCL 5 MG PO TABS *I*
10.0000 mg | ORAL_TABLET | ORAL | Status: AC | PRN
Start: 2012-12-04 — End: 2012-12-11

## 2012-12-04 MED ORDER — OXYCODONE HCL 5 MG PO TABS *I*
5.0000 mg | ORAL_TABLET | ORAL | Status: AC | PRN
Start: 2012-12-04 — End: 2012-12-11
  Administered 2012-12-05 – 2012-12-09 (×6): 5 mg via ORAL
  Filled 2012-12-04 (×7): qty 1

## 2012-12-04 MED ORDER — CARVEDILOL 3.125 MG PO TABS *I*
3.1250 mg | ORAL_TABLET | Freq: Every day | ORAL | Status: DC
Start: 2012-12-04 — End: 2012-12-04

## 2012-12-04 MED ORDER — FUROSEMIDE 10 MG/ML IJ SOLN *I*
20.0000 mg | Freq: Once | INTRAMUSCULAR | Status: AC
Start: 2012-12-04 — End: 2012-12-04

## 2012-12-04 MED ORDER — ATORVASTATIN CALCIUM 10 MG PO TABS *I*
10.0000 mg | ORAL_TABLET | Freq: Every day | ORAL | Status: DC
Start: 2012-12-04 — End: 2012-12-16
  Administered 2012-12-04 – 2012-12-15 (×12): 10 mg via ORAL
  Filled 2012-12-04 (×12): qty 1

## 2012-12-04 MED ORDER — SERTRALINE HCL 50 MG PO TABS *I*
50.0000 mg | ORAL_TABLET | Freq: Every day | ORAL | Status: DC
Start: 2012-12-04 — End: 2012-12-16
  Administered 2012-12-04 – 2012-12-16 (×12): 50 mg via ORAL
  Filled 2012-12-04 (×12): qty 1

## 2012-12-04 MED ORDER — SODIUM CHLORIDE 0.9 % IV BOLUS *I*
1000.0000 mL | Freq: Once | Status: AC
Start: 2012-12-04 — End: 2012-12-04
  Administered 2012-12-04: 1000 mL via INTRAVENOUS

## 2012-12-04 MED ORDER — KCL IN DEXTROSE-NACL 20-5-0.45 MEQ/L-%-% IV SOLN *I*
1000.0000 mL | INTRAVENOUS | Status: DC
Start: 2012-12-04 — End: 2012-12-05
  Administered 2012-12-04 (×2): 1000 mL via INTRAVENOUS

## 2012-12-04 MED ORDER — CARBIDOPA-LEVODOPA 25-100 MG PO TABS *I*
1.0000 | ORAL_TABLET | Freq: Two times a day (BID) | ORAL | Status: DC
Start: 2012-12-04 — End: 2012-12-16
  Administered 2012-12-04 – 2012-12-16 (×23): 1 via ORAL
  Filled 2012-12-04 (×23): qty 1

## 2012-12-04 MED ORDER — FUROSEMIDE 10 MG/ML IJ SOLN *I*
INTRAMUSCULAR | Status: AC
Start: 2012-12-04 — End: 2012-12-04
  Administered 2012-12-04: 20 mg via INTRAVENOUS
  Filled 2012-12-04: qty 2

## 2012-12-04 MED ORDER — POTASSIUM CHLORIDE 10 MEQ/50ML IV SOLN *I*
10.0000 meq | INTRAVENOUS | Status: AC
Start: 2012-12-04 — End: 2012-12-04
  Administered 2012-12-04 (×4): 10 meq via INTRAVENOUS
  Filled 2012-12-04 (×3): qty 50

## 2012-12-04 MED ORDER — ACETAMINOPHEN 500 MG PO TABS *I*
1000.0000 mg | ORAL_TABLET | Freq: Three times a day (TID) | ORAL | Status: DC
Start: 2012-12-04 — End: 2012-12-16
  Administered 2012-12-04 – 2012-12-16 (×31): 1000 mg via ORAL
  Filled 2012-12-04 (×32): qty 2

## 2012-12-04 MED ORDER — FAMOTIDINE 20 MG PO TABS *I*
20.0000 mg | ORAL_TABLET | Freq: Two times a day (BID) | ORAL | Status: DC
Start: 2012-12-04 — End: 2012-12-16
  Administered 2012-12-04 – 2012-12-16 (×23): 20 mg via ORAL
  Filled 2012-12-04 (×23): qty 1

## 2012-12-04 MED ORDER — SODIUM HYPOCHLORITE 0.0125 % EX SOLN (DAKIN'S) *I*
Freq: Every day | CUTANEOUS | Status: DC
Start: 2012-12-04 — End: 2012-12-09
  Filled 2012-12-04: qty 473

## 2012-12-04 MED ORDER — DOCUSATE SODIUM 50 MG PO CAPS *I*
50.0000 mg | ORAL_CAPSULE | Freq: Two times a day (BID) | ORAL | Status: DC
Start: 2012-12-04 — End: 2012-12-07
  Administered 2012-12-04 – 2012-12-06 (×3): 50 mg via ORAL
  Filled 2012-12-04 (×5): qty 1

## 2012-12-04 MED ORDER — CARVEDILOL 3.125 MG PO TABS *I*
3.1250 mg | ORAL_TABLET | Freq: Every day | ORAL | Status: DC
Start: 2012-12-04 — End: 2012-12-16
  Administered 2012-12-06 – 2012-12-16 (×8): 3.125 mg via ORAL
  Filled 2012-12-04 (×11): qty 1

## 2012-12-04 NOTE — Progress Notes (Signed)
ICU Daily Progress Note for Inpatients   LOS: 1 day       Interval History:   Received several boluses of crystalloid overnight for drops in SBP to the low 80s.  Just passed a large watery brown stool    Subjective:   Comfortable this AM    Objective Section:  Temp:  [36 C (96.8 F)-36.8 C (98.2 F)] 36.5 C (97.7 F)  Heart Rate:  [60-82] 60  Resp:  [14-18] 16  BP: (78-112)/(35-54) 96/40 mmHg  FiO2:  [30 %-45 %] 40 %     Intake/Output Summary (Last 24 hours) at 12/04/12 0839  Last data filed at 12/04/12 0734   Gross per 24 hour   Intake 4879.17 ml   Output   2830 ml   Net 2049.17 ml       Vent Settings:  Ventilator: LTV  MODE: CMV (VC)  FiO2: 40 %  S RR: 14  Vt: 450 mL  PEEP: 5 cm H20  IT: 0.8 sec  A RR: 14  PIP: 23 cm H2O  MAP: 8 cmH2O  MV: 6.1 l/min  Vt exp: 445 mL    Date Mechanical ventilation commenced:  10/22    CVP:  na     Last BM:  10/23    Current/admission weight:  63.8 Kg    Physical Exam:  Gen: Awake, responsive to voice, denises pain  HEENT: sclerae clear, neck supple, MM moist  Pulm: diminished at the bases  Cor: paced, 50s  Abd: soft, non tender, multiple surgical scars  Ext: s/p left low BKA stump; other extremities warm with diminished pulses  Neuro: right arm weakness, follows simple commands  Integument: OK    Lab Results:    Recent Labs  Lab 12/04/12  0107 12/03/12  1113   WBC 20.1* 24.1*   Hemoglobin 8.3* 11.1*   Hematocrit 26* 33*   Platelets 235 287       Recent Labs  Lab 12/04/12  0725 12/04/12  0107 12/03/12  1113   Sodium  --  144 142   Potassium  --  3.2* 3.8   Chloride  --  112* 102   CO2  --  21 25   UN  --  60* 82*   Creatinine  --  1.52* 2.19*   Glucose  --  102*  --    Magnesium 1.6 1.7  --    Phosphorus 2.4* 3.1  --        Recent Labs  Lab 12/04/12  0725 12/04/12  0107 12/03/12  1619   Protime 13.7* 13.4* 13.2*     No results found for this basename: TROP,  CKMBS,  in the last 168 hours    Recent Labs  Lab 12/03/12  1619   Total Protein 6.7       Recent Labs  Lab 12/03/12  1619    Lactate VEN,WB 1.7        Micro:  07/18/12 C-diff +    Imaging:   12/03/12 CXR: pulmonary edema, cardiomyopathy, pacer/ICD     Lines- date/site:    Active Hospital Medications:   . Sodium Hypochlorite   Topical Daily   . furosemide  20 mg Intravenous Once   . potassium chloride  10 mEq Intravenous Q1H   . heparin (porcine)  5,000 Units Subcutaneous Q8H   . piperacillin-tazobactam  3.375 g Intravenous Q6H          ipratropium-albuterol, promethazine, ondansetron, fentaNYL, meperidine, HYDROmorphone PF  Patient Active Problem List   Diagnosis Code   . AKI (acute kidney injury) 584.9   . Unspecified cerebral artery occlusion with cerebral infarction 434.91   . Coronary artery disease 414.00   . AICD (automatic cardioverter/defibrillator) present V45.02   . Hypertension 401.9   . CVA (cerebral infarction) 434.91   . ASHD (arteriosclerotic heart disease) 414.00   . MI (mitral incompetence) 424.0   . CHF (congestive heart failure) 428.0   . COPD (chronic obstructive pulmonary disease) 496   . ETOH abuse 305.00   . VT (ventricular tachycardia) 427.1   . Anemia of chronic disease 285.29   . Parkinsonism 332.0   . C. difficile colitis 008.45   . Compression fracture of L1 lumbar vertebra 805.4   . Sepsis 038.9, 995.91   . 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    PLAN:    Cardio pulmonary:   She is currently on t piece now and so far is doing well.   She's received, what I can gather is at least 5 liters of IVF post operatively.  Her CXR pre operatively showed pulmonary edema.  Her SBPs normally run in the upper 90s to low 100s, according to her prior encounters, therefore she is at her baseline.  Theoretically, she should be able to tolerate diuresis, and I think she will have a better chance to  remain extubated if she has begun to diurese. Will give 20 mg IV lasix once.  I'd like to see her be able to diurese at least 1 liter before she comes off the ventilator.       Renal/E/F:   AKI on new CKI.  Baseline creatinine is ~ 1.19, currently 1.52. UO picking up this AM.   - Follow creatinine at least daily  - Mild hypokalemia: replace IV while intubated.  Would optimally like to keep her K > 3.9 < 5.2  - Switch to oral replacements as soon as possible to eliminate extra fluid  - Replete phos when extubated   Volume goal for next 24hrs:    GI/Nutrition:   NPO while intubate.  Allow diet per surgery  - Continue pepcid, try to avoid protonix with Hx c-diff    ID (Include start and stop dates for abx):   Hx c-diff infection, 07/18/12..  A stool for c diff was sent and she has already received 2 doses of Zosyn.  Would suspect recurrence of this and consider starting treatment for recurrent c-diff    Heme:   Chronic anemia  - Drop of 7 points likely hemo dilutional  - Follow Hct  - DVT prophylaxis per surgery   Transfusion? Is Hct <21? no    Neuro:   Hx parkinson's Disease and depression  - REsume sinemet as soon as enteral route established. If unable to extubate today, place dobhoff  -     Pain/agitation/delirium:  - per surgery. Avoid morphine with diminished renal clerance      Author: Lezlie Lye, NP  as of: 12/04/2012 at: 8:39 AM

## 2012-12-04 NOTE — Progress Notes (Addendum)
General Daily SOAP Progress Note for Inpatients   LOS: 1 day     Subjective Pt received 2x IVF boluses O/N for marginal BP with good response. On minimal vent setting and Cr improving (1.52 from 2.19). Good UOP post-op.     Vitals: Blood pressure 95/38, pulse 60, temperature 36.5 C (97.7 F), temperature source Temporal, resp. rate 14, weight 63.8 kg (140 lb 10.5 oz), SpO2 100.00%.   Vital-Signs Ranges: Temp:  [36 C (96.8 F)-36.8 C (98.2 F)] 36.5 C (97.7 F)  Heart Rate:  [60-82] 60  Resp:  [14-18] 14  BP: (78-112)/(35-54) 95/38 mmHg  FiO2:  [30 %-45 %] 30 %    O2 Device: Ventilator (12/04/12 0600)  FiO2: 30 % (12/04/12 0600)    Intake/Output last 3 shifts  I/O last 3 completed shifts:  10/21 2300 - 10/22 2259  In: 2400 (37.6 mL/kg) [I.V.:2400 (1.6 mL/kg/hr)]  Out: 2195 (34.4 mL/kg) [Urine:150 (0.1 mL/kg/hr); Emesis/NG output:2000; Blood:45]  Net: 205  Weight used: 63.8 kg  Intake/Output this shift:  I/O this shift:  10/22 2300 - 10/23 0659  In: 1479.2 (23.2 mL/kg) [I.V.:1429.2; IV Piggyback:50]  Out: 515 (8.1 mL/kg) [Urine:515]  Net: 964.2  Weight used: 63.8 kg    Nursing pain score: Last Nursing documented pain: Faces pain rating: 2 (12/03/12 1535)  Revised FLACC Score: 4 (12/04/12 0400)      Objective  Pt sedated and intubated   RRR  CTA B  abd soft, NT/ND, BS wnl   L above ankle amputation with dressing c/d/i    Lab Results:   All labs in the last 24 hours:   Recent Results (from the past 24 hour(s))   BASIC METABOLIC PANEL    Collection Time     12/03/12 11:13 AM       Result Value Range    Sodium 142  133 - 145 mmol/L    Potassium 3.8  3.3 - 5.1 mmol/L    Chloride 102  96 - 108 mmol/L    CO2 25  20 - 28 mmol/L    Anion Gap 15  7 - 16    UN 82 (*) 6 - 20 mg/dL    Creatinine 1.61 (*) 0.51 - 0.95 mg/dL    GFR,Caucasian 23 (*)     GFR,Black 26 (*)     Calcium 8.9  8.6 - 10.2 mg/dL   CBC AND DIFFERENTIAL    Collection Time     12/03/12 11:13 AM       Result Value Range    WBC 24.1 (*) 4.0 - 10.0 THOU/uL     RBC 3.9  3.9 - 5.2 MIL/uL    Hemoglobin 11.1 (*) 11.2 - 15.7 g/dL    Hematocrit 33 (*) 34 - 45 %    MCV 87  79 - 95 fL    RDW 15.6 (*) 11.7 - 14.4 %    Platelets 287  160 - 370 THOU/uL    Seg Neut % 80.9 (*) 34.0 - 71.1 %    Lymphocyte % 8.7 (*) 19.3 - 51.7 %    Monocyte % 8.7  4.7 - 12.5 %    Eosinophil % 0.0 (*) 0.7 - 5.8 %    Basophil % 0.0  0.1 - 1.2 %    Neut # K/uL 19.7 (*) 1.6 - 6.1 THOU/uL    Lymph # K/uL 2.1  1.2 - 3.7 THOU/uL    Mono # K/uL 2.1 (*) 0.2 - 0.9 THOU/uL    Eos #  K/uL 0.0  0.0 - 0.4 THOU/uL    Baso # K/uL 0.0  0.0 - 0.1 THOU/uL    Nucl RBC % 0.0  0.0 - 0.2 /100 WBC    Nucl RBC # K/uL 0.0     GLUCOSE    Collection Time     12/03/12 11:13 AM       Result Value Range    Glucose 109 (*) 74 - 106 mg/dL   BANDS    Collection Time     12/03/12 11:13 AM       Result Value Range    Bands % 1  0 - 10 %   MYELOCYTE    Collection Time     12/03/12 11:13 AM       Result Value Range    Myelocyte % 1 (*) 0 - 0 %   MISC. CELL %    Collection Time     12/03/12 11:13 AM       Result Value Range    Misc. Cell % 0  0 - 0 %   DIFF BASED ON    Collection Time     12/03/12 11:13 AM       Result Value Range    Diff Based On 115     MANUAL DIFFERENTIAL    Collection Time     12/03/12 11:13 AM       Result Value Range    Manual DIFF RESULTS     SEDIMENTATION RATE, AUTOMATED    Collection Time     12/03/12  4:19 PM       Result Value Range    Sedimentation Rate 116 (*) 0 - 30 mm/hr   CRP    Collection Time     12/03/12  4:19 PM       Result Value Range    CRP 568 (*) 0 - 10 mg/L   BLOOD CULTURE    Collection Time     12/03/12  4:19 PM       Result Value Range    Bacterial Blood Culture .     LACTATE, VENOUS, WHOLE BLOOD    Collection Time     12/03/12  4:19 PM       Result Value Range    Lactate VEN,WB 1.7  0.5 - 2.2 mmol/L   HOLD GREEN WITH GEL    Collection Time     12/03/12  4:19 PM       Result Value Range    Hold Green (w/gel,spun) HOLD TUBE     HOLD BLUE    Collection Time     12/03/12  4:19 PM       Result Value  Range    Hold Blue HOLD TUBE     PROTIME-INR    Collection Time     12/03/12  4:19 PM       Result Value Range    Protime 13.2 (*) 9.2 - 12.3 sec    INR 1.2  1.0 - 1.2   HEPATIC FUNCTION PANEL    Collection Time     12/03/12  4:19 PM       Result Value Range    Total Protein 6.7  6.3 - 7.7 g/dL    Albumin 2.8 (*) 3.5 - 5.2 g/dL    Globulin 3.9  2.7 - 4.3 g/dL    Bilirubin,Total 1.0  0.0 - 1.2 mg/dL    Bili,Indirect 0.4      Bilirubin,Direct 0.6 (*) 0.0 -  0.3 mg/dL    Alk Phos 161 (*) 35 - 105 U/L    AST 84 (*) 0 - 35 U/L    ALT 30  0 - 35 U/L   TYPE AND SCREEN    Collection Time     12/03/12  6:14 PM       Result Value Range    ABO RH Blood Type A RH POS      Antibody Screen Negative     URINALYSIS WITH MICROSCOPIC    Collection Time     12/04/12  1:07 AM       Result Value Range    Color, UA Lt Yellow (*) Yellow    Appearance,UR CLEAR  Clear    Specific Gravity,UA 1.015  1.001 - 1.030    Leuk Esterase,UA NEG  NEGATIVE    Nitrite,UA NEG  NEGATIVE    pH,UA 5.0  5.0 - 8.0    Protein,UA NEG  NEGATIVE mg/dL    Glucose,UA NORM      Ketones, UA NEG  NEGATIVE    Blood,UA 1+ (*) NEGATIVE    RBC,UA 0 - 2  0 - 2 /hpf    WBC,UA NONE SEEN  0 - 5 /hpf    Bacteria,UA 1+      Squam Epithel,UA 1+  0-1+ /hpf    Renal Epithel,UA 1+ (*)     Other,UR CANCELED     BASIC METABOLIC PANEL    Collection Time     12/04/12  1:07 AM       Result Value Range    Glucose 102 (*) 60 - 99 mg/dL    Sodium 096  045 - 409 mmol/L    Potassium 3.2 (*) 3.3 - 5.1 mmol/L    Chloride 112 (*) 96 - 108 mmol/L    CO2 21  20 - 28 mmol/L    Anion Gap 11  7 - 16    UN 60 (*) 6 - 20 mg/dL    Creatinine 8.11 (*) 0.51 - 0.95 mg/dL    GFR,Caucasian 35 (*)     GFR,Black 40 (*)     Calcium 7.4 (*) 8.6 - 10.2 mg/dL   CBC AND DIFFERENTIAL    Collection Time     12/04/12  1:07 AM       Result Value Range    WBC 20.1 (*) 4.0 - 10.0 THOU/uL    RBC 3.1 (*) 3.9 - 5.2 MIL/uL    Hemoglobin 8.3 (*) 11.2 - 15.7 g/dL    Hematocrit 26 (*) 34 - 45 %    MCV 86  79 - 95 fL    RDW 16.1  (*) 11.7 - 14.4 %    Platelets 235  160 - 370 THOU/uL    Seg Neut % 92.0 (*) 34.0 - 71.1 %    Lymphocyte % 3.0 (*) 19.3 - 51.7 %    Monocyte % 4.0 (*) 4.7 - 12.5 %    Eosinophil % 0.0 (*) 0.7 - 5.8 %    Basophil % 0.0  0.1 - 1.2 %    Neut # K/uL 18.5 (*) 1.6 - 6.1 THOU/uL    Lymph # K/uL 0.6 (*) 1.2 - 3.7 THOU/uL    Mono # K/uL 0.8  0.2 - 0.9 THOU/uL    Eos # K/uL 0.0  0.0 - 0.4 THOU/uL    Baso # K/uL 0.0  0.0 - 0.1 THOU/uL   PROTIME-INR    Collection Time     12/04/12  1:07 AM       Result Value Range    Protime 13.4 (*) 9.2 - 12.3 sec    INR 1.3 (*) 1.0 - 1.2        *Note: Due to a large number of results for the requested time period, some results have not been displayed. A complete set of results can be found in Results Review.     Currently Active/Followed Hospital Problems:  Active Hospital Problems    Diagnosis   . 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 Sepsis     Assessment and Plan Section:  Assessment & Plan  67-yo F with an extensive past medical history, presented with necrotic left foot likely 2/2 acute on chronic ischemia, POD#1 s/p L above ankle amputation on 12/03/12 for sepsis.   - PST today with wean to extubation today   - Surgery team will change LLE dressing later today   - cont Zosyn for soft tissue infection, f/u final Cx's  - appreciate ICU care     Author: Wilhemina Cash, MD  as of: 12/04/2012  at: 6:07 AM    I saw and evaluated the patient. I agree with the resident's/fellow's findings and plan of care as documented above.    Vincent Gros, MD

## 2012-12-04 NOTE — Anesthesia Post-procedure Eval (Signed)
Anesthesia Post-op Note    Patient: Michelle [name]    Procedure(s) Performed: left foot amputation    Anesthesia type: General    Patient location: PACU today @ 0800  Patient was boarding in recovery room over night.    Mental Status: Recovered to baseline    Patient able to participate in this evaluation: yes  Last Vitals: BP: 96/46 mmHg (12/04/12 1730)  BP MAP : 55 mmHg (12/04/12 1124)  Heart Rate: 61 (12/04/12 1730)  Temp: 36.6 C (97.9 F) (12/04/12 1730)  Resp: 16 (12/04/12 1730)  Weight: 63.8 kg (140 lb 10.5 oz) (12/03/12 1639)  SpO2: 98 % (12/04/12 1730)      Post-op vital signs noted above are within patient's normal range  Post-op vitals signs: stable  Respiratory function: baseline    Airway patent: Yes    Cardiovascular and hydration status stable: Yes    Post-Op pain: Adequate analgesia    Post-Op nausea and vomiting: none    Post-Op assessment: no apparent anesthetic complications    Complications: none    Attending Attestation: All indicated post anesthesia care provided    Author: Rolena Infante, MD  as of: 12/04/2012  at: 6:42 PM

## 2012-12-04 NOTE — Op Note (Signed)
PATIENTNEFTALY, Michelle [name]  MR #:  [id number]   ACCOUNT #:  [id number] DOB:  08/17/1945    AGE:  67     SURGEON:  Vincent Gros, MD  CO-SURGEON:    ASSISTANT:  Marisue Humble, MD, RES.  SURGERY DATE:  12/03/2012    POSTOPERATIVE DIAGNOSIS:  Left foot gangrene with septicemia.    POSTOPERATIVE DIAGNOSIS:  Left foot gangrene with septicemia.    OPERATIVE PROCEDURE:  Left lower extremity guillotine amputation above the level of the ankle.    ANESTHESIA:  General.    OPERATIVE INDICATIONS:  [name] is a 67 year old woman who presented to our ED with sepsis including increased white blood cell count and hypotension.  The presumed source was a left foot which was necrotic with boggy soft tissues and signs of erythema headed up the leg.  Operation is indicated for resolution of sepsis.    DESCRIPTION OF OPERATION:  After informed consent was obtained, the patient was brought to the operating room and placed supine on the operative table, where general endotracheal anesthesia was induced.  The patient was prepped and draped in a normal sterile fashion.  A surgical timeout was taken.     A circumferential incision was made above the level of the ankle approximately 1/3 the way up the calf above any levels of obvious necrosis.  The skin and soft tissues were dissected sharply.  The Gigli saw was brought onto the field and used to transect the bones.     Next, the anterior tibial artery, posterior tibial artery, peroneal artery, greater saphenous vein, and lesser saphenous vein were all individually doubly suture ligated.  The Bovie was used to control any remaining muscular or soft tissue bleeding.  The wound was found to be grossly hemostatic.  A Dakin's dressing was applied and the patient was taken to the recovery unit.    ESTIMATED BLOOD LOSS:  25 cc.    PATIENT CONDITION:  Guarded condition on completion of the operation.     As the attending physician, I was present and participatory for all portions of the  operation.    PREOPERATIVE DIAGNOSIS:               ______________________________  Vincent Gros, MD    RG/MODL  DD:  12/04/2012 10:04:20  DT:  12/04/2012 10:17:07  Job #:  [id number]    cc:

## 2012-12-04 NOTE — Progress Notes (Signed)
Report given to Jake Bonhotal RN who is assuming care at this time in PACU.

## 2012-12-04 NOTE — Progress Notes (Addendum)
Patient arrived to PACU, report from Dr. Jeralene Peters.  Pt slightly hypotensive with SBP in 80s.  No treatment per Dr. Jeralene Peters.  Other VSS.  Patient intubated and sedated,  hooked up to ventilator by Sharia Reeve, RRT.  Per Dr. Jeralene Peters and Dr. Annye Rusk, will monitor patient in PACU overnight, paging Surgical [facility] with any concerns.  Will continue to monitor.  Full assessment completed.  No skin breakdown noted on back.  Will turn patient Q2 hours throughout the night.  Patient lying right.  Saunders Glance, RN     [phone]: Patient resting, nodding appropriately when questioned.  Mouth suctioned for comfort.  Will continue to monitor.  Saunders Glance, RN     0000: Surgical resident at bedside, aware of BP of 88/41.  Order placed for NS bolus.  Patient continues to rest, FLACC 5/10.  Patient medicated with dilaudid per order.  Please see MAR for details.  Patient lying left.  Will continue to monitor.  Saunders Glance, RN     0100:  Patient continues to rest, nodding appropriately, denying further pain medication.  FLACC 0/10.  Mouth suctioned for comfort.  Saunders Glance, RN     0200;  Patient resting, nodding yes to pain.  FLACC 5/10.  Patient medicated with dilaudid per order.  Please see MAR for details.  Patient lying supine.  Mouth suctioned for comfort.  Will continue to monitor.  Saunders Glance, RN     0300:  Patient sleeping comfortably.  Will continue to monitor.  Saunders Glance, RN     [phone]: Report to Roselee Nova, RN.  Saunders Glance, RN

## 2012-12-04 NOTE — Progress Notes (Signed)
PT extubated at 0920 placed at [facility], Dr.McCrumb MD at bedside. PT has no s/sx stridor or difficulty breathing. SPo2 100% on  3L NC.

## 2012-12-04 NOTE — Progress Notes (Signed)
L above ankle knee amputation dressing removed and amputation site appears healthy with no signs of active bleeding from soft tissue edge, muscle belly or bone. Fluff with Daikon solution applied, covered with dry fluff, wrapped with kerlex and ACE wrap. Pt tolerated dressing removal well without any issues. Plan for daily dressing change with Daikon solution.     Wilhemina Cash, MD  12/04/2012

## 2012-12-04 NOTE — Provider Consult (Signed)
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 Received abx / ivf and taken to OR urgently for amputation. BP 80's to 90's systolic which responds to crystalloid. She remains intubated post operatively overnight to monitor for sirs response.      Past Medical History   Diagnosis Date   . CVA (cerebral infarction) 10/23/2011   . ASHD (arteriosclerotic heart disease) 10/23/2011   . MI (mitral incompetence) 10/23/2011   . CHF (congestive heart failure) 10/23/2011     EF 30%   . COPD (chronic obstructive pulmonary disease) 10/23/2011   . VT (ventricular tachycardia) 10/23/2011     S/p AICD   . Anemia of chronic disease 10/23/2011   . HTN (hypertension) 10/23/2011   . Parkinsonism    . Coronary artery disease    . AICD (automatic cardioverter/defibrillator) present    . Myocardial infarction june 2013   . Alcohol abuse, in remission      Past Surgical History   Procedure Laterality Date   . Icd     . Hemicolectomy Left    . Cardiac defibrillator placement     . Pacemaker insertion       History reviewed. No pertinent family history.  History     Social History   . Marital Status: Unknown     Spouse Name: N/A     Number of Children: N/A   . Years of Education: N/A     Social History Main Topics   . Smoking status: Unknown If Ever Smoked   . Smokeless tobacco: None   . Alcohol Use: No      Comment: formerly, heavy drinker   . Drug Use: None   . Sexual Activity: None     Other Topics Concern   . None     Social History Narrative    ** Merged History Encounter **            Allergies: No Known Allergies (drug, envir, food or latex)    Prior to Admission Medications:  Prescriptions prior to admission   Medication Sig   . acetaminophen  (TYLENOL) 500 mg tablet Take 2 tablets (1,000 mg total) by mouth 3 times daily   . furosemide (LASIX) 20 MG tablet Take 1 tablet (20 mg total) by mouth every morning   . potassium chloride SA (KLOR-CON M20) 20 mEq CR tablet Take 1 tablet (20 mEq total) by mouth daily   . carvedilol (COREG) 6.25 MG tablet Take 0.5 tablets (3.125 mg total) by mouth daily   Hold for sbp < 110   . cyanocobalamin (VITAMIN B-12) 1000 MCG tablet Take 1,000 mcg by mouth daily   . aspirin 81 MG EC tablet Take 81 mg by mouth daily   . carbidopa-levodopa (SINEMET) 25-100 MG per tablet Take 1 tablet by mouth 2 times daily   . famotidine (PEPCID) 20 MG tablet Take 20 mg by mouth 2 times daily   . Cholecalciferol (VITAMIN D3) 50000 UNITS CAPS Take 50,000 Units by mouth every 28 days   . amiodarone (PACERONE) 400 MG tablet Take 400 mg by mouth daily   . sertraline (ZOLOFT) 50 MG tablet Take 50 mg by  mouth daily   . atorvastatin (LIPITOR) 10 MG tablet Take 10 mg by mouth daily (with dinner)   . HYDROcodone-acetaminophen (NORCO) 5-325 MG per tablet Take 1 tablet by mouth every 6 hours as needed for Pain   MDD  tablets   . guaiFENesin (ROBITUSSIN) 100 MG/5ML syrup Take 200 mg by mouth 3 times daily as needed for Congestion   . sorbitol 70 % solution Take 30 mLs by mouth daily as needed for Constipation   . ipratropium-albuterol (DUONEB) 0.5-2.5 (3) MG/3ML nebulizer solution Take 3 mLs by nebulization 4 times daily as needed for Wheezing   . calcium carbonate-vitamin D 600-400 MG-UNIT per tablet Take 1 tablet by mouth 2 times daily   . bisacodyl (DULCOLAX) 10 MG suppository Place 10 mg rectally daily as needed       Active Hospital Medications:  Current Facility-Administered Medications   Medication Dose Route Frequency   . sodium chloride 0.9%  bolus 1,000 mL  1,000 mL Intravenous Once   . ipratropium-albuterol (DUONEB) 0.5-2.5 (3) MG/3ML nebulization solution 3 mL  3 mL Nebulization 4x Daily PRN   . heparin (porcine) SQ injection 5,000 Units  5,000  Units Subcutaneous Q8H   . promethazine (PHENERGAN) injection 12.5 mg  12.5 mg Intravenous Q6H PRN   . ondansetron (ZOFRAN) injection 4 mg  4 mg Intravenous Q4H PRN   . piperacillin-tazobactam (ZOSYN) IVPB 3.375 g  3.375 g Intravenous Q6H   . Lactated Ringers Infusion  125 mL/hr Intravenous Continuous   . fentaNYL (SUBLIMAZE) injection 25 mcg  25 mcg Intravenous Q5 Min PRN   . meperidine (DEMEROL) injection 12.5 mg  12.5 mg Intravenous PRN   . sodium chloride 0.9 % IV  125 mL/hr Intravenous Continuous   . HYDROmorphone PF (DILAUDID) injection 0.5 mg  0.5 mg Intravenous Q1H PRN       Review of Systems:  Review of Systems   Unable to perform ROS: intubated       Last Nursing documented pain: Faces pain rating: 2 (12/03/12 1535)  Revised FLACC Score: 0 (12/04/12 0300)      Patient Vitals for the past 24 hrs:   BP Temp Temp src Pulse Resp SpO2 Weight   12/04/12 0300 93/35 mmHg 36.4 C (97.5 F) TEMPORAL 61 14 98 % -   12/04/12 0200 95/35 mmHg 36.4 C (97.5 F) TEMPORAL 60 14 98 % -   12/04/12 0154 - - - - - 99 % -   12/04/12 0100 97/36 mmHg 36 C (96.8 F) TEMPORAL 61 14 100 % -   12/04/12 0000 96/51 mmHg 36.2 C (97.2 F) TEMPORAL 63 17 98 % -   12/03/12 2359 - - - - - 98 % -   12/03/12 2315 103/41 mmHg - - 66 18 99 % -   12/03/12 2300 104/51 mmHg - - 66 17 100 % -   12/03/12 2245 95/43 mmHg - - 67 14 100 % -   12/03/12 2230 93/43 mmHg - - 66 15 100 % -   12/03/12 2221 - - - - - 100 % -   12/03/12 2215 82/37 mmHg - - 66 14 100 % -   12/03/12 2204 96/41 mmHg 36.8 C (98.2 F) TEMPORAL 66 15 100 % -   12/03/12 2047 - 36.8 C (98.2 F) TEMPORAL 66 18 - -   12/03/12 2028 78/49 mmHg - - 67 18 100 % -   12/03/12 1956 112/38 mmHg - - 66 14 93 % -  12/03/12 1833 98/36 mmHg - - 65 16 96 % -   12/03/12 1800 86/36 mmHg - - 65 16 93 % -   12/03/12 1752 86/36 mmHg - - 64 15 93 % -   12/03/12 1639 - - - - - - 63.8 kg (140 lb 10.5 oz)   12/03/12 1633 80/40 mmHg - - 82 18 100 % -   12/03/12 1535 91/54 mmHg 36 C (96.8 F) TEMPORAL 75  16 93 % -     O2 Device: Ventilator (12/04/12 0300)  FiO2: 30 % (12/04/12 0300)     Physical Examination:  Physical Exam  Gen: frail elderly chroncically ill appearing / intubated / easily woken / tracks with eyes  Skin: w/d  Heent: ncat / perrla  Neck: no jvd  Lung: cta  Cor: reg  Abd: snt  Ext: left lower ext above ankle amputation dressing c/d/i  Neuro: non focal      Lab Results:   All labs in the last 24 hours:   Recent Results (from the past 24 hour(s))   BASIC METABOLIC PANEL    Collection Time     12/03/12 11:13 AM       Result Value Range    Sodium 142  133 - 145 mmol/L    Potassium 3.8  3.3 - 5.1 mmol/L    Chloride 102  96 - 108 mmol/L    CO2 25  20 - 28 mmol/L    Anion Gap 15  7 - 16    UN 82 (*) 6 - 20 mg/dL    Creatinine 3.08 (*) 0.51 - 0.95 mg/dL    GFR,Caucasian 23 (*)     GFR,Black 26 (*)     Calcium 8.9  8.6 - 10.2 mg/dL   CBC AND DIFFERENTIAL    Collection Time     12/03/12 11:13 AM       Result Value Range    WBC 24.1 (*) 4.0 - 10.0 THOU/uL    RBC 3.9  3.9 - 5.2 MIL/uL    Hemoglobin 11.1 (*) 11.2 - 15.7 g/dL    Hematocrit 33 (*) 34 - 45 %    MCV 87  79 - 95 fL    RDW 15.6 (*) 11.7 - 14.4 %    Platelets 287  160 - 370 THOU/uL    Seg Neut % 80.9 (*) 34.0 - 71.1 %    Lymphocyte % 8.7 (*) 19.3 - 51.7 %    Monocyte % 8.7  4.7 - 12.5 %    Eosinophil % 0.0 (*) 0.7 - 5.8 %    Basophil % 0.0  0.1 - 1.2 %    Neut # K/uL 19.7 (*) 1.6 - 6.1 THOU/uL    Lymph # K/uL 2.1  1.2 - 3.7 THOU/uL    Mono # K/uL 2.1 (*) 0.2 - 0.9 THOU/uL    Eos # K/uL 0.0  0.0 - 0.4 THOU/uL    Baso # K/uL 0.0  0.0 - 0.1 THOU/uL    Nucl RBC % 0.0  0.0 - 0.2 /100 WBC    Nucl RBC # K/uL 0.0     GLUCOSE    Collection Time     12/03/12 11:13 AM       Result Value Range    Glucose 109 (*) 74 - 106 mg/dL   BANDS    Collection Time     12/03/12 11:13 AM       Result Value Range    Bands %  1  0 - 10 %   MYELOCYTE    Collection Time     12/03/12 11:13 AM       Result Value Range    Myelocyte % 1 (*) 0 - 0 %   MISC. CELL %    Collection Time      12/03/12 11:13 AM       Result Value Range    Misc. Cell % 0  0 - 0 %   DIFF BASED ON    Collection Time     12/03/12 11:13 AM       Result Value Range    Diff Based On 115     MANUAL DIFFERENTIAL    Collection Time     12/03/12 11:13 AM       Result Value Range    Manual DIFF RESULTS     SEDIMENTATION RATE, AUTOMATED    Collection Time     12/03/12  4:19 PM       Result Value Range    Sedimentation Rate 116 (*) 0 - 30 mm/hr   CRP    Collection Time     12/03/12  4:19 PM       Result Value Range    CRP 568 (*) 0 - 10 mg/L   BLOOD CULTURE    Collection Time     12/03/12  4:19 PM       Result Value Range    Bacterial Blood Culture .     LACTATE, VENOUS, WHOLE BLOOD    Collection Time     12/03/12  4:19 PM       Result Value Range    Lactate VEN,WB 1.7  0.5 - 2.2 mmol/L   HOLD GREEN WITH GEL    Collection Time     12/03/12  4:19 PM       Result Value Range    Hold Green (w/gel,spun) HOLD TUBE     HOLD BLUE    Collection Time     12/03/12  4:19 PM       Result Value Range    Hold Blue HOLD TUBE     PROTIME-INR    Collection Time     12/03/12  4:19 PM       Result Value Range    Protime 13.2 (*) 9.2 - 12.3 sec    INR 1.2  1.0 - 1.2   HEPATIC FUNCTION PANEL    Collection Time     12/03/12  4:19 PM       Result Value Range    Total Protein 6.7  6.3 - 7.7 g/dL    Albumin 2.8 (*) 3.5 - 5.2 g/dL    Globulin 3.9  2.7 - 4.3 g/dL    Bilirubin,Total 1.0  0.0 - 1.2 mg/dL    Bili,Indirect 0.4      Bilirubin,Direct 0.6 (*) 0.0 - 0.3 mg/dL    Alk Phos 147 (*) 35 - 105 U/L    AST 84 (*) 0 - 35 U/L    ALT 30  0 - 35 U/L   TYPE AND SCREEN    Collection Time     12/03/12  6:14 PM       Result Value Range    ABO RH Blood Type A RH POS      Antibody Screen Negative     URINALYSIS WITH MICROSCOPIC    Collection Time     12/04/12  1:07 AM       Result  Value Range    Color, UA Lt Yellow (*) Yellow    Appearance,UR CLEAR  Clear    Specific Gravity,UA 1.015  1.001 - 1.030    Leuk Esterase,UA NEG  NEGATIVE    Nitrite,UA NEG  NEGATIVE    pH,UA  5.0  5.0 - 8.0    Protein,UA NEG  NEGATIVE mg/dL    Glucose,UA NORM      Ketones, UA NEG  NEGATIVE    Blood,UA 1+ (*) NEGATIVE    RBC,UA 0 - 2  0 - 2 /hpf    WBC,UA NONE SEEN  0 - 5 /hpf    Bacteria,UA 1+      Squam Epithel,UA 1+  0-1+ /hpf    Renal Epithel,UA 1+ (*)     Other,UR CANCELED     BASIC METABOLIC PANEL    Collection Time     12/04/12  1:07 AM       Result Value Range    Glucose 102 (*) 60 - 99 mg/dL    Sodium 147  829 - 562 mmol/L    Potassium 3.2 (*) 3.3 - 5.1 mmol/L    Chloride 112 (*) 96 - 108 mmol/L    CO2 21  20 - 28 mmol/L    Anion Gap 11  7 - 16    UN 60 (*) 6 - 20 mg/dL    Creatinine 1.30 (*) 0.51 - 0.95 mg/dL    GFR,Caucasian 35 (*)     GFR,Black 40 (*)     Calcium 7.4 (*) 8.6 - 10.2 mg/dL   CBC AND DIFFERENTIAL    Collection Time     12/04/12  1:07 AM       Result Value Range    WBC 20.1 (*) 4.0 - 10.0 THOU/uL    RBC 3.1 (*) 3.9 - 5.2 MIL/uL    Hemoglobin 8.3 (*) 11.2 - 15.7 g/dL    Hematocrit 26 (*) 34 - 45 %    MCV 86  79 - 95 fL    RDW 16.1 (*) 11.7 - 14.4 %    Platelets 235  160 - 370 THOU/uL    Seg Neut % 92.0 (*) 34.0 - 71.1 %    Lymphocyte % 3.0 (*) 19.3 - 51.7 %    Monocyte % 4.0 (*) 4.7 - 12.5 %    Eosinophil % 0.0 (*) 0.7 - 5.8 %    Basophil % 0.0  0.1 - 1.2 %    Neut # K/uL 18.5 (*) 1.6 - 6.1 THOU/uL    Lymph # K/uL 0.6 (*) 1.2 - 3.7 THOU/uL    Mono # K/uL 0.8  0.2 - 0.9 THOU/uL    Eos # K/uL 0.0  0.0 - 0.4 THOU/uL    Baso # K/uL 0.0  0.0 - 0.1 THOU/uL   PROTIME-INR    Collection Time     12/04/12  1:07 AM       Result Value Range    Protime 13.4 (*) 9.2 - 12.3 sec    INR 1.3 (*) 1.0 - 1.2        *Note: Due to a large number of results for the requested time period, some results have not been displayed. A complete set of results can be found in Results Review.       Radiology impressions (last 3 days):  No results found.      Currently Active/Followed Hospital Problems:  Active Hospital Problems    Diagnosis   . Marland Kitchen*Gangrene of left  foot s/p above ankle amputation 12/03/12   . Sepsis        Assessment:   Left foot wet gangrene with necrosis  S/p left above ankle amputation  Sepsis    D/t lack of beds will remain in pacu on surgery service with icu consulting. Will move to icu when bed becomes available if still intubated and requiring critical care. Will remain closely involved with her care    Plan:     Pulm: minimal vent settings doing well    Cv: hold anti htn meds / bolus for soft bp    Id: continue vanco /zosyn pending cx data    Renal: serial lytes / excellent uop / ivf    Heme: dvt prophylaxis        Author: Terance Ice, PA  Note created: 12/04/2012  at: 3:49 AM

## 2012-12-04 NOTE — Progress Notes (Signed)
Surgical moonlighter post-op note    Procedure:  Left above ankle amputation    Subjective:  Remains intubated and sedated post-op  Requiring boluses for hypotension    Objective:  Date 12/03/12 0700 - 12/04/12 0659 12/04/12 0700 - 12/05/12 0659   Shift 0700-1459 1500-2259 2300-0659 24 Hour Total 0700-1459 1500-2259 2300-0659 24 Hour Total   I  N  T  A  K  E   P.O.  0 0 0          P.O.  0 0 0        I.V.  2400  (4.7) 116.7 2516.7          I.V.  2400  2400          Volume (mL) (Lactated Ringers Infusion)   116.7 116.7        Shift Total  (mL/kg)  2400  (37.6) 116.7  (1.8) 2516.7  (39.4)       O  U  T  P  U  T   Urine  150  (0.3) 100 250          Output (mL) (Urethral Catheter Foley  (Straight tip) 16 fr)  150 100 250        Emesis/NG output  2000  2000          Emesis  2000  2000        Stool  0  0          Stool  0  0        Blood  45  45          Blood  45  45        Shift Total  (mL/kg)  2195  (34.4) 100  (1.6) 2295  (36)       NET  205 16.7 221.7       Weight (kg)  63.8 63.8 63.8 63.8 63.8 63.8 63.8       Filed Vitals:    12/03/12 2315   BP: 103/41   Pulse: 66   Temp:    Resp: 18   Weight:        Gen: Intubated and sedated  CV: RRR  Resp: CTAB  Abd: Soft, nt/nd  GU: Foley with clear yellow urine  Ext: RLE WWP  LLE dressing c/d/i    . sodium chloride 0.9%  bolus  1,000 mL Intravenous Once   . heparin (porcine)  5,000 Units Subcutaneous Q8H   . piperacillin-tazobactam  3.375 g Intravenous Q6H       ipratropium-albuterol, promethazine, ondansetron, fentaNYL, meperidine, HYDROmorphone PF    Recent Results (from the past 24 hour(s))   BASIC METABOLIC PANEL    Collection Time     12/03/12 11:13 AM       Result Value Range    Sodium 142  133 - 145 mmol/L    Potassium 3.8  3.3 - 5.1 mmol/L    Chloride 102  96 - 108 mmol/L    CO2 25  20 - 28 mmol/L    Anion Gap 15  7 - 16    UN 82 (*) 6 - 20 mg/dL    Creatinine 1.61 (*) 0.51 - 0.95 mg/dL    GFR,Caucasian 23 (*)     GFR,Black 26 (*)     Calcium 8.9  8.6 - 10.2 mg/dL   CBC  AND DIFFERENTIAL    Collection Time     12/03/12 11:13 AM  Result Value Range    WBC 24.1 (*) 4.0 - 10.0 THOU/uL    RBC 3.9  3.9 - 5.2 MIL/uL    Hemoglobin 11.1 (*) 11.2 - 15.7 g/dL    Hematocrit 33 (*) 34 - 45 %    MCV 87  79 - 95 fL    RDW 15.6 (*) 11.7 - 14.4 %    Platelets 287  160 - 370 THOU/uL    Seg Neut % 80.9 (*) 34.0 - 71.1 %    Lymphocyte % 8.7 (*) 19.3 - 51.7 %    Monocyte % 8.7  4.7 - 12.5 %    Eosinophil % 0.0 (*) 0.7 - 5.8 %    Basophil % 0.0  0.1 - 1.2 %    Neut # K/uL 19.7 (*) 1.6 - 6.1 THOU/uL    Lymph # K/uL 2.1  1.2 - 3.7 THOU/uL    Mono # K/uL 2.1 (*) 0.2 - 0.9 THOU/uL    Eos # K/uL 0.0  0.0 - 0.4 THOU/uL    Baso # K/uL 0.0  0.0 - 0.1 THOU/uL    Nucl RBC % 0.0  0.0 - 0.2 /100 WBC    Nucl RBC # K/uL 0.0     GLUCOSE    Collection Time     12/03/12 11:13 AM       Result Value Range    Glucose 109 (*) 74 - 106 mg/dL   BANDS    Collection Time     12/03/12 11:13 AM       Result Value Range    Bands % 1  0 - 10 %   MYELOCYTE    Collection Time     12/03/12 11:13 AM       Result Value Range    Myelocyte % 1 (*) 0 - 0 %   MISC. CELL %    Collection Time     12/03/12 11:13 AM       Result Value Range    Misc. Cell % 0  0 - 0 %   DIFF BASED ON    Collection Time     12/03/12 11:13 AM       Result Value Range    Diff Based On 115     MANUAL DIFFERENTIAL    Collection Time     12/03/12 11:13 AM       Result Value Range    Manual DIFF RESULTS     SEDIMENTATION RATE, AUTOMATED    Collection Time     12/03/12  4:19 PM       Result Value Range    Sedimentation Rate 116 (*) 0 - 30 mm/hr   CRP    Collection Time     12/03/12  4:19 PM       Result Value Range    CRP 568 (*) 0 - 10 mg/L   BLOOD CULTURE    Collection Time     12/03/12  4:19 PM       Result Value Range    Bacterial Blood Culture .     LACTATE, VENOUS, WHOLE BLOOD    Collection Time     12/03/12  4:19 PM       Result Value Range    Lactate VEN,WB 1.7  0.5 - 2.2 mmol/L   HOLD GREEN WITH GEL    Collection Time     12/03/12  4:19 PM       Result  Value Range  Hold Green (w/gel,spun) HOLD TUBE     HOLD BLUE    Collection Time     12/03/12  4:19 PM       Result Value Range    Hold Blue HOLD TUBE     PROTIME-INR    Collection Time     12/03/12  4:19 PM       Result Value Range    Protime 13.2 (*) 9.2 - 12.3 sec    INR 1.2  1.0 - 1.2   HEPATIC FUNCTION PANEL    Collection Time     12/03/12  4:19 PM       Result Value Range    Total Protein 6.7  6.3 - 7.7 g/dL    Albumin 2.8 (*) 3.5 - 5.2 g/dL    Globulin 3.9  2.7 - 4.3 g/dL    Bilirubin,Total 1.0  0.0 - 1.2 mg/dL    Bili,Indirect 0.4      Bilirubin,Direct 0.6 (*) 0.0 - 0.3 mg/dL    Alk Phos 161 (*) 35 - 105 U/L    AST 84 (*) 0 - 35 U/L    ALT 30  0 - 35 U/L   TYPE AND SCREEN    Collection Time     12/03/12  6:14 PM       Result Value Range    ABO RH Blood Type A RH POS      Antibody Screen Negative           Assessment and plan:  67 year old female with wet gangrene of the left foot and sepsis now POD#0 s/p left above ankle amputation    -Vent management and sedation per ICU, appreciate care  -Monitor BP closely, bolus as needed  -Monitor UOP  -IV zosyn  -SQH  -Foley catheter    Denice Bors, MD 12/04/2012 12:07 AM

## 2012-12-05 LAB — CBC AND DIFFERENTIAL
Baso # K/uL: 0 10*3/uL (ref 0.0–0.1)
Basophil %: 0.1 % (ref 0.1–1.2)
Eos # K/uL: 0.4 10*3/uL (ref 0.0–0.4)
Eosinophil %: 2.5 % (ref 0.7–5.8)
Hematocrit: 28 % — ABNORMAL LOW (ref 34–45)
Hemoglobin: 8.9 g/dL — ABNORMAL LOW (ref 11.2–15.7)
Lymph # K/uL: 2.3 10*3/uL (ref 1.2–3.7)
Lymphocyte %: 15.9 % — ABNORMAL LOW (ref 19.3–51.7)
MCV: 88 fL (ref 79–95)
Mono # K/uL: 1.2 10*3/uL — ABNORMAL HIGH (ref 0.2–0.9)
Monocyte %: 8.1 % (ref 4.7–12.5)
Neut # K/uL: 10.1 10*3/uL — ABNORMAL HIGH (ref 1.6–6.1)
Platelets: 234 10*3/uL (ref 160–370)
RBC: 3.2 MIL/uL — ABNORMAL LOW (ref 3.9–5.2)
RDW: 16.5 % — ABNORMAL HIGH (ref 11.7–14.4)
Seg Neut %: 71.3 % — ABNORMAL HIGH (ref 34.0–71.1)
WBC: 14.1 10*3/uL — ABNORMAL HIGH (ref 4.0–10.0)

## 2012-12-05 LAB — BASIC METABOLIC PANEL
Anion Gap: 11 (ref 7–16)
CO2: 19 mmol/L — ABNORMAL LOW (ref 20–28)
Calcium: 7.7 mg/dL — ABNORMAL LOW (ref 8.6–10.2)
Chloride: 115 mmol/L — ABNORMAL HIGH (ref 96–108)
Creatinine: 1.06 mg/dL — ABNORMAL HIGH (ref 0.51–0.95)
GFR,Black: 62 *
GFR,Caucasian: 54 * — AB
Glucose: 98 mg/dL (ref 60–99)
Lab: 37 mg/dL — ABNORMAL HIGH (ref 6–20)
Potassium: 3.6 mmol/L (ref 3.3–5.1)
Sodium: 145 mmol/L (ref 133–145)

## 2012-12-05 LAB — MAGNESIUM: Magnesium: 1.5 mEq/L (ref 1.3–2.1)

## 2012-12-05 LAB — PHOSPHORUS: Phosphorus: 2.2 mg/dL — ABNORMAL LOW (ref 2.7–4.5)

## 2012-12-05 LAB — PROTIME-INR
INR: 1.2 (ref 1.0–1.2)
Protime: 12.8 s — ABNORMAL HIGH (ref 9.2–12.3)

## 2012-12-05 MED ORDER — POTASSIUM & SODIUM PHOSPHATES 280-160-250 MG PO PACK *I*
1.0000 | PACK | Freq: Two times a day (BID) | ORAL | Status: AC
Start: 2012-12-05 — End: 2012-12-05
  Administered 2012-12-05 (×2): 1 via ORAL
  Filled 2012-12-05 (×2): qty 1

## 2012-12-05 MED ORDER — EUCERIN EX CREA *I*
TOPICAL_CREAM | CUTANEOUS | Status: DC | PRN
Start: 2012-12-05 — End: 2012-12-05

## 2012-12-05 MED ORDER — METRONIDAZOLE 250 MG PO TABS *I*
500.0000 mg | ORAL_TABLET | Freq: Three times a day (TID) | ORAL | Status: DC
Start: 2012-12-05 — End: 2012-12-16
  Administered 2012-12-05 – 2012-12-16 (×32): 500 mg via ORAL
  Filled 2012-12-05 (×32): qty 2

## 2012-12-05 NOTE — Plan of Care (Signed)
Problem: Impaired Bed Mobility  Goal: STG - IMPROVE BED MOBILITY  Patient will perform bed mobility with rails and the head of bed up with Moderate assist of 1    Time frame: 3-5 days    Problem: Decreased Balance  Goal: STG - IMPROVE BALANCE  Patient will be able to sit unsupported at edge of bed with Contact guard assist for 1. Minutes.      Patient will be able to sit with upper extremity support at edge of bed with Contact guard assist for 5 minutes    Time Frame: 3-5 days

## 2012-12-05 NOTE — Progress Notes (Addendum)
General Daily SOAP Progress Note for Inpatients   LOS: 2 days     Subjective To floor from ICU yesterday. Stable. Pain well-controlled. Tolerating diet. Denies CP/SOB. Denies fevers/chills. Denies nausea/vomiting. Afebrile. WBC down to 14. Hct stable at 28.    Vitals: Blood pressure 100/60, pulse 62, temperature 36 C (96.8 F), temperature source Temporal, resp. rate 20, height 1.626 m (5\' 4" ), weight 63.8 kg (140 lb 10.5 oz), SpO2 98.00%.   Vital-Signs Ranges: Temp:  [35.7 C (96.3 F)-36.6 C (97.9 F)] 36 C (96.8 F)  Heart Rate:  [60-62] 62  Resp:  [16-20] 20  BP: (94-110)/(32-60) 100/60 mmHg  FiO2:  [30 %-40 %] 40 %    O2 Device: Nasal cannula (12/05/12 0600)  FiO2: 40 % (12/04/12 0900)  O2 Flow Rate: 2 L/min (12/05/12 0600)    Intake/Output last 3 shifts  I/O last 3 completed shifts:  10/23 0700 - 10/24 0659  In: 2340 (36.7 mL/kg) [P.O.:360; I.V.:1630 (1.1 mL/kg/hr); IV Piggyback:350]  Out: 2460 (38.6 mL/kg) [Urine:2460 (1.6 mL/kg/hr)]  Net: -120  Weight used: 63.8 kg  Intake/Output this shift:       Nursing pain score: Last Nursing documented pain:  0-10 Scale: 4 (12/05/12 0617)  Revised FLACC Score: 0 (12/05/12 0404)      Objective:  Vital signs: (most recent): Blood pressure 100/60, pulse 62, temperature 36 C (96.8 F), temperature source Temporal, resp. rate 20, height 1.626 m (5\' 4" ), weight 63.8 kg (140 lb 10.5 oz), SpO2 98.00%.      Pt sedated and intubated   RRR  CTA B  abd soft, NT/ND, BS wnl   L above ankle amputation with dressing c/d/i    Lab Results:   All labs in the last 24 hours:   Recent Results (from the past 24 hour(s))   PROTIME-INR    Collection Time     12/04/12  7:25 AM       Result Value Range    Protime 13.7 (*) 9.2 - 12.3 sec    INR 1.3 (*) 1.0 - 1.2   MAGNESIUM    Collection Time     12/04/12  7:25 AM       Result Value Range    Magnesium 1.6  1.3 - 2.1 mEq/L   PHOSPHORUS    Collection Time     12/04/12  7:25 AM       Result Value Range    Phosphorus 2.4 (*) 2.7 - 4.5 mg/dL    VANCOMYCIN, RANDOM    Collection Time     12/04/12  9:12 PM       Result Value Range    Vancomycin Level 9.4     BASIC METABOLIC PANEL    Collection Time     12/05/12  1:26 AM       Result Value Range    Glucose 98  60 - 99 mg/dL    Sodium 295  621 - 308 mmol/L    Potassium 3.6  3.3 - 5.1 mmol/L    Chloride 115 (*) 96 - 108 mmol/L    CO2 19 (*) 20 - 28 mmol/L    Anion Gap 11  7 - 16    UN 37 (*) 6 - 20 mg/dL    Creatinine 6.57 (*) 0.51 - 0.95 mg/dL    GFR,Caucasian 54 (*)     GFR,Black 62      Calcium 7.7 (*) 8.6 - 10.2 mg/dL   CBC AND DIFFERENTIAL    Collection Time  12/05/12  1:26 AM       Result Value Range    WBC 14.1 (*) 4.0 - 10.0 THOU/uL    RBC 3.2 (*) 3.9 - 5.2 MIL/uL    Hemoglobin 8.9 (*) 11.2 - 15.7 g/dL    Hematocrit 28 (*) 34 - 45 %    MCV 88  79 - 95 fL    RDW 16.5 (*) 11.7 - 14.4 %    Platelets 234  160 - 370 THOU/uL    Seg Neut % 71.3 (*) 34.0 - 71.1 %    Lymphocyte % 15.9 (*) 19.3 - 51.7 %    Monocyte % 8.1  4.7 - 12.5 %    Eosinophil % 2.5  0.7 - 5.8 %    Basophil % 0.1  0.1 - 1.2 %    Neut # K/uL 10.1 (*) 1.6 - 6.1 THOU/uL    Lymph # K/uL 2.3  1.2 - 3.7 THOU/uL    Mono # K/uL 1.2 (*) 0.2 - 0.9 THOU/uL    Eos # K/uL 0.4  0.0 - 0.4 THOU/uL    Baso # K/uL 0.0  0.0 - 0.1 THOU/uL   PROTIME-INR    Collection Time     12/05/12  1:26 AM       Result Value Range    Protime 12.8 (*) 9.2 - 12.3 sec    INR 1.2  1.0 - 1.2   MAGNESIUM    Collection Time     12/05/12  1:26 AM       Result Value Range    Magnesium 1.5  1.3 - 2.1 mEq/L   PHOSPHORUS    Collection Time     12/05/12  1:26 AM       Result Value Range    Phosphorus 2.2 (*) 2.7 - 4.5 mg/dL     Currently Active/Followed Hospital Problems:  Active Hospital Problems    Diagnosis   . 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 Ischemic necrosis of foot   . Sepsis     Assessment and Plan Section:  Assessment & Plan  67-yo F with an extensive past medical history, presented with necrotic left foot likely 2/2 acute on chronic ischemia,  POD#2 s/p L above ankle amputation on 12/03/12 for sepsis.   - cont Zosyn for soft tissue infection, f/u final Cx's  - C diff positive - started on Flagyl  - hep lock IV  - Foley out, void check at 3 PM      Author: Loraine Grip, MD  as of: 12/05/2012  at: 7:21 AM    I saw and evaluated the patient. I agree with the resident's/fellow's findings and plan of care as documented above.    As above. AKA Tuesday    Vincent Gros, MD

## 2012-12-05 NOTE — Progress Notes (Signed)
Physical Therapy    Initial evaluation completed.       12/05/12 1427   Prior Living    Prior Living Situation Obtained via chart   Lives With Adult care facility   Receives Help From Personal care attendant   Type of Home Facility (SNF)   Prior Function Level   Prior Function Level Obtained via chart   Transfer Devices rollator walker   Walking Unable   Additional Comments 1 assist transfer to chair   PT Tracking   PT TRACKING PT Coverage   Visit Number   Visit Number 1   Precautions/Observations   Precautions used x   Brace Applied X   Brace Type Knee immobilizer   LDA Observation IV lines;O2 (comment)   Isolation Precautions Contact   Fall Precautions General falls precautions   Pain Assessment   *Is the patient currently in pain? Denies   Cognition   Cognition X   Overall Cognitive Status Impaired   Orientation Oriented to person;Oriented to place;Disoriented to time;Disoriented to situation   Following Commands Follows simple commands with increased time;Follows simple commands with repetition   UE Assessment   UE Assessment Impaired RUE AROM;Impaired LUE AROM  (B R>L UE shaking with AROM, low functional use currently)   LE Assessment   LE Assessment Full AROM RLE   Bed Mobility   Bed mobility x   Supine to Sit 2 person assist;Maximum assist    Sit to Supine Maximum assist ;2 person assist   Transfers   Transfers Not tested   Balance   Balance x   Sitting - Static Maximum assist;Supported   Assessment   Brief Assessment Appropriate for skilled therapy   Problem List Impaired cognition;Impaired balance;Impaired transfers;Impaired bed mobility   Patient / Family Goal none stated   Plan/Recommendation   Treatment Interventions Restorative PT;Balance training;Transfers training;Bed mobility training;Strengthening   PT Frequency 5 day/wk;30 minute sessions   Hospital Stay Recommendations ROM daily   Discharge Recommendations Skilled Nursing Facility Rehab;Anticipate return to prior living arrangement   PT Discharge  Equipment Recommended None   Assessment/Recommendations Reviewed With: Social Worker;Nursing;Patient   Time Calculation   PT Untimed Codes 17   PT Total Treatment 17   PT Charges   PT [facility] Charges Eval [address], [place]

## 2012-12-05 NOTE — Progress Notes (Signed)
Sw called and spoke with Melissa in Admissions at the [facility] [phone]. Pt resides there and does have  Bed hold  And can return when medically stable. SW also spoke with nursing at the [facility] and they state that at baseline pt is a 1 assist pivot transfer to rolling walker and was a 1 assist with rolling walker for ambulation. Most recently the pt was a 2 assist with transfers. SW gave placement office a referral to open [facility]. Sw will continue to follow.   [name] LMSW  10:45 AM  12/05/2012

## 2012-12-06 LAB — CBC AND DIFFERENTIAL
Baso # K/uL: 0 10*3/uL (ref 0.0–0.1)
Basophil %: 0 % (ref 0.1–1.2)
Eos # K/uL: 0.4 10*3/uL (ref 0.0–0.4)
Eosinophil %: 2 % (ref 0.7–5.8)
Hematocrit: 29 % — ABNORMAL LOW (ref 34–45)
Hemoglobin: 9.5 g/dL — ABNORMAL LOW (ref 11.2–15.7)
Lymph # K/uL: 2.6 10*3/uL (ref 1.2–3.7)
Lymphocyte %: 13 % — ABNORMAL LOW (ref 19.3–51.7)
MCV: 87 fL (ref 79–95)
Mono # K/uL: 0.8 10*3/uL (ref 0.2–0.9)
Monocyte %: 4 % — ABNORMAL LOW (ref 4.7–12.5)
Neut # K/uL: 15.8 10*3/uL — ABNORMAL HIGH (ref 1.6–6.1)
Nucl RBC # K/uL: 0 10*3/uL
Nucl RBC %: 0 /100 WBC (ref 0.0–0.2)
Platelets: 288 10*3/uL (ref 160–370)
RBC: 3.3 MIL/uL — ABNORMAL LOW (ref 3.9–5.2)
RDW: 16.3 % — ABNORMAL HIGH (ref 11.7–14.4)
Seg Neut %: 79 % — ABNORMAL HIGH (ref 34.0–71.1)
WBC: 19.7 10*3/uL — ABNORMAL HIGH (ref 4.0–10.0)

## 2012-12-06 LAB — PROTIME-INR
INR: 1.3 — ABNORMAL HIGH (ref 1.0–1.2)
Protime: 13.5 s — ABNORMAL HIGH (ref 9.2–12.3)

## 2012-12-06 LAB — BASIC METABOLIC PANEL
Anion Gap: 12 (ref 7–16)
CO2: 19 mmol/L — ABNORMAL LOW (ref 20–28)
Calcium: 7.8 mg/dL — ABNORMAL LOW (ref 8.6–10.2)
Chloride: 111 mmol/L — ABNORMAL HIGH (ref 96–108)
Creatinine: 0.75 mg/dL (ref 0.51–0.95)
GFR,Black: 95 *
GFR,Caucasian: 82 *
Glucose: 100 mg/dL — ABNORMAL HIGH (ref 60–99)
Lab: 17 mg/dL (ref 6–20)
Potassium: 3.8 mmol/L (ref 3.3–5.1)
Sodium: 142 mmol/L (ref 133–145)

## 2012-12-06 LAB — TARGET CELLS

## 2012-12-06 LAB — ANISOCYTOSIS

## 2012-12-06 LAB — MAGNESIUM: Magnesium: 1.4 mEq/L (ref 1.3–2.1)

## 2012-12-06 LAB — PHOSPHORUS: Phosphorus: 1.5 mg/dL — ABNORMAL LOW (ref 2.7–4.5)

## 2012-12-06 LAB — TEAR DROP CELLS

## 2012-12-06 LAB — METAMYELOCYTE: Metamyelocyte %: 1 % (ref 0–1)

## 2012-12-06 LAB — BANDS: Bands %: 1 % (ref 0–10)

## 2012-12-06 LAB — SLIDE NUMBER: Slide # (Heme): 1233

## 2012-12-06 LAB — MANUAL DIFFERENTIAL

## 2012-12-06 LAB — RBC MORPHOLOGY

## 2012-12-06 LAB — DIFF BASED ON: Diff Based On: 100 CELLS

## 2012-12-06 MED ORDER — MAGNESIUM SULFATE 2GM IN 50ML STERILE WATER *A*
2000.0000 mg | Freq: Once | INTRAMUSCULAR | Status: AC
Start: 2012-12-06 — End: 2012-12-06
  Administered 2012-12-06: 2000 mg via INTRAVENOUS
  Filled 2012-12-06: qty 50

## 2012-12-06 MED ORDER — SODIUM PHOSPHATE 3 MMOLE/ML IV SOLN *I*
15.0000 mmol | Freq: Once | INTRAVENOUS | Status: AC
Start: 2012-12-06 — End: 2012-12-06
  Administered 2012-12-06: 15 mmol via INTRAVENOUS
  Filled 2012-12-06: qty 5

## 2012-12-06 NOTE — Progress Notes (Addendum)
General Daily SOAP Progress Note for Inpatients   LOS: 3 days     Subjective   No acute events overnight. Dressing change performed this morning with nursing. Extremely painful for patient. Otherwise well.     Vitals: Blood pressure 128/60, pulse 71, temperature 36.1 C (97 F), temperature source Oral, resp. rate 16, height 1.626 m (5\' 4" ), weight 63.8 kg (140 lb 10.5 oz), SpO2 94.00%.   Vital-Signs Ranges: Temp:  [35.5 C (95.9 F)-36.6 C (97.9 F)] 36.1 C (97 F)  Heart Rate:  [64-75] 71  Resp:  [16-20] 16  BP: (108-152)/(60-72) 128/60 mmHg  FiO2:  [2 %] 2 %    O2 Device: None (Room air) (12/06/12 0823)  FiO2: 2 % (12/05/12 1425)    Intake/Output last 3 shifts  I/O last 3 completed shifts:  10/24 0700 - 10/25 0659  In: 1475 (23.1 mL/kg) [P.O.:1225; IV Piggyback:250]  Out: 450 (7.1 mL/kg) [Urine:450 (0.3 mL/kg/hr)]  Net: 1025  Weight used: 63.8 kg  Intake/Output this shift:  I/O this shift:  10/25 0700 - 10/25 1459  In: 130 (2 mL/kg) [P.O.:120; I.V.:10]  Out: - (0 mL/kg)   Net: 130  Weight used: 63.8 kg    Nursing pain score: Last Nursing documented pain:  0-10 Scale: 10 (12/06/12 0818)  Revised FLACC Score: 0 (12/06/12 0444)      Objective  General: Resting comfortably in bed. Dressing change attempted. Crying during the same. Better once dressing change complete.   Heart: RRR  Lungs: Non-labored respirations  Abdomen: Soft, non-tender.   Extremities: Dressing removed. Left tibia exposed. No active oozing. Wound bed healthy.   Left medial thigh with small wound-- white, pale at base. Skin likely dead around this area.     Lab Results:   All labs in the last 24 hours:   Recent Results (from the past 24 hour(s))   BASIC METABOLIC PANEL    Collection Time     12/06/12 12:20 AM       Result Value Range    Glucose 100 (*) 60 - 99 mg/dL    Sodium 454  098 - 119 mmol/L    Potassium 3.8  3.3 - 5.1 mmol/L    Chloride 111 (*) 96 - 108 mmol/L    CO2 19 (*) 20 - 28 mmol/L    Anion Gap 12  7 - 16    UN 17  6 - 20 mg/dL     Creatinine 1.47  8.29 - 0.95 mg/dL    GFR,Caucasian 82      GFR,Black 95      Calcium 7.8 (*) 8.6 - 10.2 mg/dL   CBC AND DIFFERENTIAL    Collection Time     12/06/12 12:20 AM       Result Value Range    WBC 19.7 (*) 4.0 - 10.0 THOU/uL    RBC 3.3 (*) 3.9 - 5.2 MIL/uL    Hemoglobin 9.5 (*) 11.2 - 15.7 g/dL    Hematocrit 29 (*) 34 - 45 %    MCV 87  79 - 95 fL    RDW 16.3 (*) 11.7 - 14.4 %    Platelets 288  160 - 370 THOU/uL    Seg Neut % 79.0 (*) 34.0 - 71.1 %    Lymphocyte % 13.0 (*) 19.3 - 51.7 %    Monocyte % 4.0 (*) 4.7 - 12.5 %    Eosinophil % 2.0  0.7 - 5.8 %    Basophil % 0.0  0.1 -  1.2 %    Neut # K/uL 15.8 (*) 1.6 - 6.1 THOU/uL    Lymph # K/uL 2.6  1.2 - 3.7 THOU/uL    Mono # K/uL 0.8  0.2 - 0.9 THOU/uL    Eos # K/uL 0.4  0.0 - 0.4 THOU/uL    Baso # K/uL 0.0  0.0 - 0.1 THOU/uL    Nucl RBC % 0.0  0.0 - 0.2 /100 WBC    Nucl RBC # K/uL 0.0     BANDS    Collection Time     12/06/12 12:20 AM       Result Value Range    Bands % 1  0 - 10 %   METAMYELOCYTE    Collection Time     12/06/12 12:20 AM       Result Value Range    Metamyelocyte % 1  0 - 1 %   DIFF BASED ON    Collection Time     12/06/12 12:20 AM       Result Value Range    Diff Based On 100     RBC MORPHOLOGY    Collection Time     12/06/12 12:20 AM       Result Value Range    RBC Morphology RESULTS     ANISOCYTOSIS    Collection Time     12/06/12 12:20 AM       Result Value Range    Anisocytosis Present     TARGET CELLS    Collection Time     12/06/12 12:20 AM       Result Value Range    Target Cells Present     TEAR DROP CELLS    Collection Time     12/06/12 12:20 AM       Result Value Range    Tear Drop Cells Present     SLIDE NUMBER    Collection Time     12/06/12 12:20 AM       Result Value Range    Slide # (Heme) 1233     MANUAL DIFFERENTIAL    Collection Time     12/06/12 12:20 AM       Result Value Range    Manual DIFF RESULTS     PROTIME-INR    Collection Time     12/06/12  3:55 AM       Result Value Range    Protime 13.5 (*) 9.2 - 12.3 sec    INR  1.3 (*) 1.0 - 1.2   MAGNESIUM    Collection Time     12/06/12  3:55 AM       Result Value Range    Magnesium 1.4  1.3 - 2.1 mEq/L   PHOSPHORUS    Collection Time     12/06/12  3:55 AM       Result Value Range    Phosphorus 1.5 (*) 2.7 - 4.5 mg/dL     Currently Active/Followed Hospital Problems:  Active Hospital Problems    Diagnosis   . 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 Ischemic necrosis of foot   . Sepsis     Assessment and Plan Section:  Assessment & Plan  67-yo F with an extensive past medical history, presented with necrotic left foot likely 2/2 acute on chronic ischemia, POD#3 s/p L above ankle amputation on 12/03/12 for sepsis.     - Continue Zosyn. Cultures from 12/22 growing Pseudomonas.  WBC increased  today but pt remains afebrile. Will trend for now.   - C diff positive - Continue Flagyl.   - Daily dressing change. May be performed by nusing.   - Home meds.  - DVT ppx.   - Dispo: AKA next Tuesday.         Author: Glenford Bayley, MD  as of: 12/06/2012  at: 9:18 AM

## 2012-12-07 LAB — URINALYSIS REFLEX TO CULTURE
Nitrite,UA: NEGATIVE
Protein,UA: 25 mg/dL — AB
Specific Gravity,UA: 1.026 (ref 1.001–1.030)
pH,UA: 5 (ref 5.0–8.0)

## 2012-12-07 LAB — PHOSPHORUS: Phosphorus: 2.2 mg/dL — ABNORMAL LOW (ref 2.7–4.5)

## 2012-12-07 LAB — CBC AND DIFFERENTIAL
Baso # K/uL: 0 10*3/uL (ref 0.0–0.1)
Basophil %: 0 % (ref 0.1–1.2)
Eos # K/uL: 0 10*3/uL (ref 0.0–0.4)
Eosinophil %: 0 % — ABNORMAL LOW (ref 0.7–5.8)
Hematocrit: 27 % — ABNORMAL LOW (ref 34–45)
Hemoglobin: 9 g/dL — ABNORMAL LOW (ref 11.2–15.7)
Lymph # K/uL: 3.9 10*3/uL — ABNORMAL HIGH (ref 1.2–3.7)
Lymphocyte %: 18 % — ABNORMAL LOW (ref 19.3–51.7)
MCV: 86 fL (ref 79–95)
Mono # K/uL: 0.4 10*3/uL (ref 0.2–0.9)
Monocyte %: 2 % — ABNORMAL LOW (ref 4.7–12.5)
Neut # K/uL: 17.3 10*3/uL — ABNORMAL HIGH (ref 1.6–6.1)
Nucl RBC # K/uL: 0.1 10*3/uL
Nucl RBC %: 0 /100 WBC (ref 0.0–0.2)
RBC: 3.2 MIL/uL — ABNORMAL LOW (ref 3.9–5.2)
RDW: 16.5 % — ABNORMAL HIGH (ref 11.7–14.4)
Seg Neut %: 78 % — ABNORMAL HIGH (ref 34.0–71.1)
WBC: 21.6 10*3/uL — ABNORMAL HIGH (ref 4.0–10.0)

## 2012-12-07 LAB — BASIC METABOLIC PANEL
Anion Gap: 15 (ref 7–16)
CO2: 19 mmol/L — ABNORMAL LOW (ref 20–28)
Calcium: 7.7 mg/dL — ABNORMAL LOW (ref 8.6–10.2)
Chloride: 111 mmol/L — ABNORMAL HIGH (ref 96–108)
Creatinine: 0.8 mg/dL (ref 0.51–0.95)
GFR,Black: 88 *
GFR,Caucasian: 76 *
Glucose: 100 mg/dL — ABNORMAL HIGH (ref 60–99)
Lab: 13 mg/dL (ref 6–20)
Potassium: 3.5 mmol/L (ref 3.3–5.1)
Sodium: 145 mmol/L (ref 133–145)

## 2012-12-07 LAB — URINE MICROSCOPIC (IQ200): RBC,UA: NONE SEEN /hpf (ref 0–2)

## 2012-12-07 LAB — MANUAL DIFFERENTIAL

## 2012-12-07 LAB — TARGET CELLS

## 2012-12-07 LAB — MAGNESIUM: Magnesium: 1.9 mEq/L (ref 1.3–2.1)

## 2012-12-07 LAB — ANISOCYTOSIS

## 2012-12-07 LAB — DIFF BASED ON: Diff Based On: 100 CELLS

## 2012-12-07 LAB — SLIDE NUMBER: Slide # (Heme): 1262

## 2012-12-07 LAB — BANDS: Bands %: 2 % (ref 0–10)

## 2012-12-07 LAB — HOLD LAVENDER

## 2012-12-07 MED ORDER — CIPROFLOXACIN HCL 500 MG PO TABS *I*
500.0000 mg | ORAL_TABLET | Freq: Two times a day (BID) | ORAL | Status: DC
Start: 2012-12-07 — End: 2012-12-07

## 2012-12-07 NOTE — Progress Notes (Addendum)
Vascular Surgery Progress Note    Patient: Michelle [name]    LOS: 4 days    Attending: Vincent Gros, MD     INTERVAL HISTORY & SUBJECTIVE  No acute events overnight. Dressing had to be changed for strikethrough. WBC elevated today.     OBJECTIVE  Physical Exam:  Temp:  [34.8 C (94.6 F)-36.8 C (98.3 F)] 34.8 C (94.6 F)  Heart Rate:  [62-72] 66  Resp:  [16-18] 16  BP: (105-134)/(51-70) 108/51 mmHg   GEN: no apparent distress  HEENT: NCAT, face symmetric  CHEST: nonlabored respirations  ABD: soft, nondistended, nontender  NEURO/MOTOR: alert, pleasantly confused  EXTREMITIES: LLE in knee immobilizer, fresh dressings with min s/s strikethrough. RLE warm.    Intake/Output:  10/25 0700 - 10/26 0659  In: 670 [P.O.:510; I.V.:10]  Out: -      Medications:  Current Facility-Administered Medications   Medication   . metroNIDAZOLE (FLAGYL) tablet 500 mg   . Sodium Hypochlorite (DAKIN'S) external solution   . atorvastatin (LIPITOR) tablet 10 mg   . carbidopa-levodopa (SINEMET) 25-100 MG per tablet 1 tablet   . famotidine (PEPCID) tablet 20 mg   . sertraline (ZOLOFT) tablet 50 mg   . acetaminophen (TYLENOL) tablet 1,000 mg   . oxyCODONE (ROXICODONE) IR tablet 5 mg   . oxyCODONE (ROXICODONE) IR tablet 10 mg   . carvedilol (COREG) tablet 3.125 mg   . ipratropium-albuterol (DUONEB) 0.5-2.5 (3) MG/3ML nebulization solution 3 mL   . heparin (porcine) SQ injection 5,000 Units   . ondansetron (ZOFRAN) injection 4 mg   . piperacillin-tazobactam (ZOSYN) IVPB 3.375 g   . HYDROmorphone PF (DILAUDID) injection 0.5 mg         Laboratory values:   Recent Labs      12/06/12   2350  12/06/12   0355  12/06/12   0020  12/05/12   0126   WBC  21.6*   --   19.7*  14.1*   Hemoglobin  9.0*   --   9.5*  8.9*   Hematocrit  27*   --   29*  28*   Platelets   --    --   288  234   INR   --   1.3*   --   1.2    Recent Labs      12/06/12   2350  12/06/12   0355  12/06/12   0020  12/05/12   0126   Sodium  145   --   142  145   Potassium  3.5   --   3.8   3.6   Chloride  111*   --   111*  115*   CO2  19*   --   19*  19*   UN  13   --   17  37*   Creatinine  0.80   --   0.75  1.06*   Glucose  100*   --   100*  98   Calcium  7.7*   --   7.8*  7.7*   Magnesium   --   1.4   --   1.5   Phosphorus   --   1.5*   --   2.2*    No results found for this basename: AST, ALT, ALK, TB, DB,  in the last 72 hoursNo results found for this basename: TP, ALB, PALB,  in the last 72 hours  No results found for this basename: AMY, LIP,  in the last 72 hours   GLUCOSE: No results found for this basename: PGLU,  in the last 72 hours  Imaging: No results found.     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 WBC elevated today.    PLAN   UA/CXR for workup of WBC. Wound reportedly unchanged when inspected by covering provider overnight. Will inspect wound again tomorrow; dressing changes are very painful for pt.   Cont Zosyn   Cont Flagyl for C diff   Knee immobilizer   General diet   Analgesia with oxycodone, dilaudid prn   Home meds   OOB to chair   DVT ppx   Left AKA on Tuesday    Author: Marisue Humble, MD as of: 12/07/2012  at: 9:52 AM       ADDENDUM  UA with findings c/w UTI. CXR equivocal for pneumonia. Pt already on Zosyn which is adequate to treat both UTI and pneumonia.    Author: Marisue Humble, MD as of: 12/07/2012  at: 2:31 PM

## 2012-12-08 ENCOUNTER — Other Ambulatory Visit: Payer: Self-pay | Admitting: Gastroenterology

## 2012-12-08 LAB — POTASSIUM: Potassium: 4.3 mmol/L (ref 3.3–5.1)

## 2012-12-08 LAB — CBC AND DIFFERENTIAL
Baso # K/uL: 0 10*3/uL (ref 0.0–0.1)
Basophil %: 0 % (ref 0.1–1.2)
Eos # K/uL: 1 10*3/uL — ABNORMAL HIGH (ref 0.0–0.4)
Eosinophil %: 5 % (ref 0.7–5.8)
Hematocrit: 24 % — ABNORMAL LOW (ref 34–45)
Hemoglobin: 8.1 g/dL — ABNORMAL LOW (ref 11.2–15.7)
Lymph # K/uL: 1.9 10*3/uL (ref 1.2–3.7)
Lymphocyte %: 10 % — ABNORMAL LOW (ref 19.3–51.7)
MCV: 85 fL (ref 79–95)
Mono # K/uL: 0.4 10*3/uL (ref 0.2–0.9)
Monocyte %: 2 % — ABNORMAL LOW (ref 4.7–12.5)
Neut # K/uL: 15.8 10*3/uL — ABNORMAL HIGH (ref 1.6–6.1)
Nucl RBC # K/uL: 0.1 10*3/uL
Nucl RBC %: 1 /100 WBC — ABNORMAL HIGH (ref 0.0–0.2)
Platelets: 243 10*3/uL (ref 160–370)
RBC: 2.8 MIL/uL — ABNORMAL LOW (ref 3.9–5.2)
RDW: 16.5 % — ABNORMAL HIGH (ref 11.7–14.4)
Seg Neut %: 81 % — ABNORMAL HIGH (ref 34.0–71.1)
WBC: 19 10*3/uL — ABNORMAL HIGH (ref 4.0–10.0)

## 2012-12-08 LAB — SLIDE NUMBER: Slide # (Heme): 1279

## 2012-12-08 LAB — MANUAL DIFFERENTIAL

## 2012-12-08 LAB — BASIC METABOLIC PANEL
Anion Gap: 12 (ref 7–16)
Anion Gap: 11 (ref 7–16)
CO2: 20 mmol/L (ref 20–28)
CO2: 21 mmol/L (ref 20–28)
Calcium: 7.5 mg/dL — ABNORMAL LOW (ref 8.6–10.2)
Calcium: 7.3 mg/dL — ABNORMAL LOW (ref 8.6–10.2)
Chloride: 110 mmol/L — ABNORMAL HIGH (ref 96–108)
Chloride: 112 mmol/L — ABNORMAL HIGH (ref 96–108)
Creatinine: 0.82 mg/dL (ref 0.51–0.95)
Creatinine: 0.77 mg/dL (ref 0.51–0.95)
GFR,Black: 85 *
GFR,Black: 92 *
GFR,Caucasian: 74 *
GFR,Caucasian: 80 *
Glucose: 93 mg/dL (ref 60–99)
Glucose: 137 mg/dL — ABNORMAL HIGH (ref 60–99)
Lab: 11 mg/dL (ref 6–20)
Lab: 10 mg/dL (ref 6–20)
Potassium: 2.8 mmol/L — CL (ref 3.3–5.1)
Potassium: 3.4 mmol/L (ref 3.3–5.1)
Sodium: 142 mmol/L (ref 133–145)
Sodium: 144 mmol/L (ref 133–145)

## 2012-12-08 LAB — MAGNESIUM
Magnesium: 1.6 mEq/L (ref 1.3–2.1)
Magnesium: 1.7 mEq/L (ref 1.3–2.1)

## 2012-12-08 LAB — TARGET CELLS

## 2012-12-08 LAB — MACROCYTIC

## 2012-12-08 LAB — EKG 12-LEAD
P: 59 degrees
QRS: 264 degrees
Rate: 68 {beats}/min
Severity: ABNORMAL
Severity: ABNORMAL
Statement: ABNORMAL
T: 100 degrees

## 2012-12-08 LAB — AEROBIC CULTURE: Aerobic Culture: 0

## 2012-12-08 LAB — BANDS: Bands %: 2 % (ref 0–10)

## 2012-12-08 LAB — ANISOCYTOSIS

## 2012-12-08 LAB — DIFF BASED ON: Diff Based On: 100 CELLS

## 2012-12-08 LAB — SPICULATED

## 2012-12-08 LAB — PHOSPHORUS: Phosphorus: 1.9 mg/dL — ABNORMAL LOW (ref 2.7–4.5)

## 2012-12-08 MED ORDER — SODIUM CHLORIDE 0.9 % IV SOLN WRAPPED *I*
50.0000 mL/h | Status: DC
Start: 2012-12-09 — End: 2012-12-09
  Administered 2012-12-09: 50 mL/h via INTRAVENOUS

## 2012-12-08 MED ORDER — SODIUM CHLORIDE 0.9 % IV SOLN WRAPPED *I*
30.0000 mmol | Freq: Once | INTRAVENOUS | Status: AC
Start: 2012-12-08 — End: 2012-12-08
  Administered 2012-12-08: 30 mmol via INTRAVENOUS
  Filled 2012-12-08: qty 10

## 2012-12-08 NOTE — Plan of Care (Signed)
Fluid and Electrolyte Imbalance    • Fluid and electrolyte balance are achieved/maintained Maintaining        Hemodynamic Status    • Patient has stable vital signs and fluid balance Maintaining        Mobility    • Functional status is maintained or improved - Geriatric Maintaining        Nutrition    • Nutritional status is maintained or improved - Geriatric Maintaining        Pain/Comfort    • Patient's pain or discomfort is manageable Maintaining        Psychosocial    • Demonstrates ability to cope with illness Maintaining        Safety    • Patient will remain free of falls Maintaining    • Prevent any intentional injury Maintaining

## 2012-12-08 NOTE — Progress Notes (Addendum)
Vascular Surgery Progress Note    Patient: Michelle [name]    LOS: 5 days    Attending: Vincent Gros, MD     INTERVAL HISTORY & SUBJECTIVE  No acute events overnight. UA with findings c/w UTI. CXR equivocal for pneumonia. Continuing Zosyn for coverage of both. Denies nausea/vomiting/CP/SOB/fevers/chills. WBC 19 from 21. K 2.8, being repleted w/ IV potassium.        OBJECTIVE  Physical Exam:  Temp:  [36 C (96.8 F)-36.7 C (98.1 F)] 36.1 C (97 F)  Heart Rate:  [62-68] 62  Resp:  [16-18] 16  BP: (98-120)/(50-70) 120/70 mmHg   GEN: no apparent distress  HEENT: NCAT, face symmetric  CHEST: nonlabored respirations  ABD: soft, nondistended, nontender  NEURO/MOTOR: alert, pleasantly confused  EXTREMITIES: LLE in knee immobilizer, dressing C/D/I. RLE warm.    Intake/Output:  10/26 0700 - 10/27 0659  In: 1290 [P.O.:1080; I.V.:10]  Out: 200 [Urine:200]     Medications:  Current Facility-Administered Medications   Medication   . potassium phosphate 30 mmol in sodium chloride 0.9 %   . metroNIDAZOLE (FLAGYL) tablet 500 mg   . Sodium Hypochlorite (DAKIN'S) external solution   . atorvastatin (LIPITOR) tablet 10 mg   . carbidopa-levodopa (SINEMET) 25-100 MG per tablet 1 tablet   . famotidine (PEPCID) tablet 20 mg   . sertraline (ZOLOFT) tablet 50 mg   . acetaminophen (TYLENOL) tablet 1,000 mg   . oxyCODONE (ROXICODONE) IR tablet 5 mg   . oxyCODONE (ROXICODONE) IR tablet 10 mg   . carvedilol (COREG) tablet 3.125 mg   . ipratropium-albuterol (DUONEB) 0.5-2.5 (3) MG/3ML nebulization solution 3 mL   . heparin (porcine) SQ injection 5,000 Units   . ondansetron (ZOFRAN) injection 4 mg   . piperacillin-tazobactam (ZOSYN) IVPB 3.375 g   . HYDROmorphone PF (DILAUDID) injection 0.5 mg         Laboratory values:   Recent Labs      12/08/12   0016  12/06/12   2350  12/06/12   0355  12/06/12   0020   WBC  19.0*  21.6*   --   19.7*   Hemoglobin  8.1*  9.0*   --   9.5*   Hematocrit  24*  27*   --   29*   Platelets  243   --    --   288      INR   --    --   1.3*   --     Recent Labs      12/08/12   0034  12/08/12   0016  12/07/12   0833  12/06/12   2350   Sodium   --   142   --   145   Potassium   --   2.8*   --   3.5   Chloride   --   110*   --   111*   CO2   --   20   --   19*   UN   --   11   --   13   Creatinine   --   0.82   --   0.80   Glucose   --   93   --   100*   Calcium   --   7.5*   --   7.7*   Magnesium  CANCELED  1.7  1.9   --    Phosphorus   --   1.9*  2.2*   --  No results found for this basename: AST, ALT, ALK, TB, DB,  in the last 72 hoursNo results found for this basename: TP, ALB, PALB,  in the last 72 hours  No results found for this basename: AMY, LIP,  in the last 72 hours   GLUCOSE: No results found for this basename: PGLU,  in the last 72 hours  Imaging: * Portable Chest Standard Ap Single View    12/07/2012   IMPRESSION:   1. The lungs are hypoventilated.   2. Mild prominence of the pulmonary interstitial markings involving  the lungs bilaterally. Differential includes pulmonary edema and/or  infection.   3. There is patchy increased density and consolidation involving the  lower lung zones bilaterally, left greater than right, similar to the  prior study. Differential includes atelectasis and/or pneumonia.   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 I agree with the resident's/fellow's findings and plan of care as documented above. Details of my evaluation are as follows:     As above. OR tomorrow for Completion AKA.    Vincent Gros, MD

## 2012-12-08 NOTE — Progress Notes (Signed)
Physical Therapy    Pt currently refusing participation in PT session.  Pt stating she was "not up for anything".  Per MD note pt planned for OR tomorrow for AKA.  Will follow up as appropriate.

## 2012-12-09 ENCOUNTER — Ambulatory Visit: Payer: Self-pay | Admitting: Surgery

## 2012-12-09 LAB — BANDS: Bands %: 1 % (ref 0–10)

## 2012-12-09 LAB — MAGNESIUM: Magnesium: 1.6 mEq/L (ref 1.3–2.1)

## 2012-12-09 LAB — CBC AND DIFFERENTIAL
Baso # K/uL: 0 10*3/uL (ref 0.0–0.1)
Basophil %: 0 % (ref 0.1–1.2)
Eos # K/uL: 0 10*3/uL (ref 0.0–0.4)
Eosinophil %: 0 % — ABNORMAL LOW (ref 0.7–5.8)
Hematocrit: 24 % — ABNORMAL LOW (ref 34–45)
Hemoglobin: 8 g/dL — ABNORMAL LOW (ref 11.2–15.7)
Lymph # K/uL: 1 10*3/uL — ABNORMAL LOW (ref 1.2–3.7)
Lymphocyte %: 5 % — ABNORMAL LOW (ref 19.3–51.7)
MCV: 86 fL (ref 79–95)
Mono # K/uL: 1 10*3/uL — ABNORMAL HIGH (ref 0.2–0.9)
Monocyte %: 5 % (ref 4.7–12.5)
Neut # K/uL: 18.1 10*3/uL — ABNORMAL HIGH (ref 1.6–6.1)
Nucl RBC # K/uL: 0.4 10*3/uL
Nucl RBC %: 2 /100 WBC — ABNORMAL HIGH (ref 0.0–0.2)
Platelets: 267 10*3/uL (ref 160–370)
RBC: 2.8 MIL/uL — ABNORMAL LOW (ref 3.9–5.2)
RDW: 17.3 % — ABNORMAL HIGH (ref 11.7–14.4)
Seg Neut %: 88 % — ABNORMAL HIGH (ref 34.0–71.1)
WBC: 20.3 10*3/uL — ABNORMAL HIGH (ref 4.0–10.0)

## 2012-12-09 LAB — SPICULATED

## 2012-12-09 LAB — BASIC METABOLIC PANEL
Anion Gap: 16 (ref 7–16)
CO2: 17 mmol/L — ABNORMAL LOW (ref 20–28)
Calcium: 7.4 mg/dL — ABNORMAL LOW (ref 8.6–10.2)
Chloride: 111 mmol/L — ABNORMAL HIGH (ref 96–108)
Creatinine: 0.76 mg/dL (ref 0.51–0.95)
GFR,Black: 93 *
GFR,Caucasian: 81 *
Glucose: 84 mg/dL (ref 60–99)
Lab: 10 mg/dL (ref 6–20)
Potassium: 3.4 mmol/L (ref 3.3–5.1)
Sodium: 144 mmol/L (ref 133–145)

## 2012-12-09 LAB — BLOOD CULTURE: Bacterial Blood Culture: 0

## 2012-12-09 LAB — METAMYELOCYTE: Metamyelocyte %: 1 % (ref 0–1)

## 2012-12-09 LAB — DIFF BASED ON: Diff Based On: 100 CELLS

## 2012-12-09 LAB — PHOSPHORUS: Phosphorus: 2.5 mg/dL — ABNORMAL LOW (ref 2.7–4.5)

## 2012-12-09 LAB — ANISOCYTOSIS

## 2012-12-09 LAB — TYPE AND SCREEN
ABO RH Blood Type: A POS
Antibody Screen: NEGATIVE

## 2012-12-09 LAB — TARGET CELLS

## 2012-12-09 LAB — SLIDE NUMBER: Slide # (Heme): 1318

## 2012-12-09 LAB — MANUAL DIFFERENTIAL

## 2012-12-09 LAB — MACROCYTIC

## 2012-12-09 MED ORDER — FENTANYL CITRATE 50 MCG/ML IJ SOLN *WRAPPED*
INTRAMUSCULAR | Status: AC
Start: 2012-12-09 — End: 2012-12-09
  Filled 2012-12-09: qty 2

## 2012-12-09 MED ORDER — HYDROMORPHONE HCL PF 1 MG/ML IJ SOLN *WRAPPED*
INTRAMUSCULAR | Status: AC
Start: 2012-12-09 — End: 2012-12-09
  Filled 2012-12-09: qty 1

## 2012-12-09 MED ORDER — PROPOFOL 10 MG/ML IV EMUL (INTERMITTENT DOSING) WRAPPED *I*
INTRAVENOUS | Status: AC
Start: 2012-12-09 — End: 2012-12-09
  Filled 2012-12-09: qty 20

## 2012-12-09 MED ORDER — LACTATED RINGERS IV SOLN *I*
125.0000 mL/h | INTRAVENOUS | Status: DC
Start: 2012-12-09 — End: 2012-12-09
  Administered 2012-12-09: 125 mL/h via INTRAVENOUS

## 2012-12-09 MED ORDER — SODIUM CHLORIDE 0.9 % IV SOLN WRAPPED *I*
3.0000 mL/h | Status: DC
Start: 2012-12-09 — End: 2012-12-09

## 2012-12-09 MED ORDER — MAGNESIUM SULFATE 2GM IN 50ML STERILE WATER *A*
2000.0000 mg | Freq: Once | INTRAMUSCULAR | Status: AC
Start: 2012-12-09 — End: 2012-12-09
  Administered 2012-12-09: 2000 mg via INTRAVENOUS
  Filled 2012-12-09: qty 50

## 2012-12-09 MED ORDER — LACTATED RINGERS IV SOLN *I*
75.0000 mL/h | INTRAVENOUS | Status: DC
Start: 2012-12-09 — End: 2012-12-10
  Administered 2012-12-09 – 2012-12-10 (×2): 75 mL/h via INTRAVENOUS

## 2012-12-09 MED ORDER — MEPERIDINE HCL 25 MG/ML IJ SOLN *I*
12.5000 mg | INTRAMUSCULAR | Status: DC | PRN
Start: 2012-12-09 — End: 2012-12-09

## 2012-12-09 MED ORDER — ONDANSETRON HCL 2 MG/ML IV SOLN *I*
4.0000 mg | Freq: Once | INTRAMUSCULAR | Status: DC | PRN
Start: 2012-12-09 — End: 2012-12-09

## 2012-12-09 MED ORDER — HYDROMORPHONE HCL PF 1 MG/ML IJ SOLN *WRAPPED*
0.4000 mg | INTRAMUSCULAR | Status: DC | PRN
Start: 2012-12-09 — End: 2012-12-09

## 2012-12-09 MED ORDER — LIDOCAINE HCL 2 % (PF) IJ SOLN *I*
INTRAMUSCULAR | Status: AC
Start: 2012-12-09 — End: 2012-12-09
  Filled 2012-12-09: qty 5

## 2012-12-09 MED ORDER — PROMETHAZINE HCL 25 MG/ML IJ SOLN *I*
6.2500 mg | Freq: Once | INTRAMUSCULAR | Status: DC | PRN
Start: 2012-12-09 — End: 2012-12-09

## 2012-12-09 MED ORDER — SODIUM CHLORIDE 0.9 % IV SOLN WRAPPED *I*
15.0000 mmol | Freq: Once | INTRAVENOUS | Status: AC
Start: 2012-12-09 — End: 2012-12-09
  Administered 2012-12-09 (×2): 15 mmol via INTRAVENOUS
  Filled 2012-12-09: qty 5

## 2012-12-09 NOTE — Progress Notes (Addendum)
Vascular Surgery Progress Note    Patient: Michelle [name]    LOS: 6 days    Attending: Vincent Gros, MD     INTERVAL HISTORY & SUBJECTIVE  No acute events overnight. Denies nausea/vomiting/CP/SOB/fevers/chills. WBC 20.3 from 19. Hct 24.        OBJECTIVE  Physical Exam:  Temp:  [35.9 C (96.6 F)-36.5 C (97.7 F)] 36.4 C (97.6 F)  Heart Rate:  [64-74] 69  Resp:  [16] 16  BP: (104-122)/(52-70) 110/60 mmHg   GEN: no apparent distress  HEENT: NCAT, face symmetric  CHEST: nonlabored respirations  ABD: soft, nondistended, nontender  NEURO/MOTOR: alert, pleasantly confused  EXTREMITIES: LLE in knee immobilizer, dressing C/D/I. RLE warm.    Intake/Output:  10/27 0700 - 10/28 0659  In: 1387.5 [P.O.:360; I.V.:327.5]  Out: 0      Medications:  Current Facility-Administered Medications   Medication   . magnesium sulfate in sterile water infusion 2,000 mg   . sodium chloride 0.9 % IV   . metroNIDAZOLE (FLAGYL) tablet 500 mg   . Sodium Hypochlorite (DAKIN'S) external solution   . atorvastatin (LIPITOR) tablet 10 mg   . carbidopa-levodopa (SINEMET) 25-100 MG per tablet 1 tablet   . famotidine (PEPCID) tablet 20 mg   . sertraline (ZOLOFT) tablet 50 mg   . acetaminophen (TYLENOL) tablet 1,000 mg   . oxyCODONE (ROXICODONE) IR tablet 5 mg   . oxyCODONE (ROXICODONE) IR tablet 10 mg   . carvedilol (COREG) tablet 3.125 mg   . ipratropium-albuterol (DUONEB) 0.5-2.5 (3) MG/3ML nebulization solution 3 mL   . heparin (porcine) SQ injection 5,000 Units   . ondansetron (ZOFRAN) injection 4 mg   . piperacillin-tazobactam (ZOSYN) IVPB 3.375 g   . HYDROmorphone PF (DILAUDID) injection 0.5 mg         Laboratory values:   Recent Labs      12/09/12   0041  12/08/12   0016   WBC  20.3*  19.0*   Hemoglobin  8.0*  8.1*   Hematocrit  24*  24*   Platelets  267  243    Recent Labs      12/09/12   0041  12/08/12   1609  12/08/12   1110   12/08/12   0016   Sodium  144   --   144   --   142   Potassium  3.4  4.3  3.4   < >  2.8*   Chloride  111*   --    112*   --   110*   CO2  17*   --   21   --   20   UN  10   --   10   --   11   Creatinine  0.76   --   0.77   --   0.82   Glucose  84   --   137*   --   93   Calcium  7.4*   --   7.3*   --   7.5*   Magnesium  1.6   --   1.6   < >  1.7   Phosphorus  2.5*   --    --    --   1.9*    < > = values in this interval not displayed.    No results found for this basename: AST, ALT, ALK, TB, DB,  in the last 72 hoursNo results found for this basename: TP, ALB, PALB,  in  the last 72 hours  No results found for this basename: AMY, LIP,  in the last 72 hours   GLUCOSE: No results found for this basename: PGLU,  in the last 72 hours  Imaging: * Portable Chest Standard Ap Single View    12/07/2012   IMPRESSION:   1. The lungs are hypoventilated.   2. Mild prominence of the pulmonary interstitial markings involving  the lungs bilaterally. Differential includes pulmonary edema and/or  infection.   3. There is patchy increased density and consolidation involving the  lower lung zones bilaterally, left greater than right, similar to the  prior study. Differential includes atelectasis and/or pneumonia.   END OF REPORT       ASSESSMENT  Michelle [name] is a 67 y.o. 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   PLAN   Left completion AKA w/ Dr. Annye Rusk today   Transfuse 1 U PRBCs pre-op for Hct 24   Cont Zosyn   Cont Flagyl for C diff   Knee immobilizer   NPO for OR today   Analgesia with ATC Tylenol, oxycodone, dilaudid prn   Home meds   OOB to chair   DVT ppx      Author: Loraine Grip, MD as of: 12/09/2012  at: 8:30 AM     I saw and evaluated the patient. I agree with the resident's/fellow's findings and plan of care as documented above.    Vincent Gros, MD

## 2012-12-09 NOTE — INTERIM OP NOTE (Addendum)
Interim Op Note    Date of Surgery: 12/09/2012  Surgeon: Vincent Gros, MD  First Assistant: Loraine Grip, MD res    Pre-Op Diagnosis: Left foot wet gangrene, s/p above ankle amputation    Anesthesia Type: General    Post-Op Diagnosis:    Primary: Same as pre-op  Secondary: --  Tertiary: --    Additional Findings (Including unexpected complications): none    Procedure(s) Performed (including CPT 4 Code if available)  1) Left above knee amputation (16109)    Estimated Blood Loss: 100 mL   Packing: No  Drains: No  Fluid Totals: Intakes: See anesthesia note Outputs: See anesthesia note  Specimens to Pathology: no  Patient Condition: good  Vascular Quality Initiative - Lower Extremity Amputation Procedure Notes    Urgency: Urgent     Anesthesia: General    Current Amputation Side: left    Level:  AKA     Indication: Uncontrolled Infection  --------------------------------------------------    Incision:  Closed         Dressing: Gauze Only             Planned Staged Amputation: yes          (If more than one amputation side - please add an additional [place] SUR VASC VQI AMP MULTIPLE to document additional amputation side information)    EBL: 100 ml  Total Procedure Time: 60 minutes    Heart Rate:   On Arrival in OR: 60 bpm  Highest intra-op: 70 bpm

## 2012-12-09 NOTE — Progress Notes (Signed)
Physical Therapy    Hold physical therapy services at this time due to pt to the OR for AKA.  Please re-order physical therapy when appropriate.

## 2012-12-09 NOTE — Anesthesia Pre-procedure Eval (Signed)
Anesthesia Pre-operative Evaluation for Michelle [name]      Health History  Past Medical History   Diagnosis Date   . CVA (cerebral infarction) 10/23/2011   . ASHD (arteriosclerotic heart disease) 10/23/2011   . MI (mitral incompetence) 10/23/2011   . CHF (congestive heart failure) 10/23/2011     EF 30%   . COPD (chronic obstructive pulmonary disease) 10/23/2011   . VT (ventricular tachycardia) 10/23/2011     S/p AICD   . Anemia of chronic disease 10/23/2011   . HTN (hypertension) 10/23/2011   . Parkinsonism    . Coronary artery disease    . AICD (automatic cardioverter/defibrillator) present    . Myocardial infarction june 2013   . Alcohol abuse, in remission      Past Surgical History   Procedure Laterality Date   . Icd     . Hemicolectomy Left    . Cardiac defibrillator placement     . Pacemaker insertion       Social History  History   Substance Use Topics   . Smoking status: Unknown If Ever Smoked   . Smokeless tobacco: Not on file   . Alcohol Use: No      Comment: formerly, heavy drinker      History   Drug Use Not on file     ______________________________________________________________________  Allergies: No Known Allergies (drug, envir, food or latex)  Prior to Admission Medications    Last Medication Reconciliation Action:  Requires Completion Salem Senate, MD 12/03/2012  4:17 PM              Last Dose Start Date End Date Provider     Cholecalciferol (VITAMIN D3) 50000 UNITS CAPS   --  --  [provider]     HYDROcodone-acetaminophen (NORCO) 5-325 MG per tablet   --  --  [provider]     acetaminophen (TYLENOL) 500 mg tablet   07/25/12  --  Romesser, Teressa Lower, MD     Take 2 tablets (1,000 mg total) by mouth 3 times daily     amiodarone (PACERONE) 400 MG tablet   --  --  [provider]     aspirin 81 MG EC tablet   --  --  [provider]     atorvastatin (LIPITOR) 10 MG tablet   --  --  [provider]     bisacodyl (DULCOLAX) 10 MG suppository   --  --   [provider]     calcium carbonate-vitamin D 600-400 MG-UNIT per tablet   --  --  [provider]     carbidopa-levodopa (SINEMET) 25-100 MG per tablet   --  --  [provider]     carvedilol (COREG) 6.25 MG tablet   07/25/12  --  Romesser, Teressa Lower, MD     Take 0.5 tablets (3.125 mg total) by mouth daily   Hold for sbp < 110     cyanocobalamin (VITAMIN B-12) 1000 MCG tablet   --  --  [provider]     famotidine (PEPCID) 20 MG tablet   --  --  [provider]     furosemide (LASIX) 20 MG tablet (Expired)   07/25/12  08/24/12  Romesser, Teressa Lower, MD     Take 1 tablet (20 mg total) by mouth every morning     guaiFENesin (ROBITUSSIN) 100 MG/5ML syrup   --  --  [provider]     ipratropium-albuterol (DUONEB)  0.5-2.5 (3) MG/3ML nebulizer solution   --  --  [provider]     potassium chloride SA (KLOR-CON M20) 20 mEq CR tablet   07/25/12  01/21/13  Romesser, Teressa Lower, MD     Take 1 tablet (20 mEq total) by mouth daily     sertraline (ZOLOFT) 50 MG tablet   --  --  [provider]     sorbitol 70 % solution   --  --  [provider]        Current Facility-Administered Medications   Medication   . sodium chloride 0.9 % IV   . [COMPLETED] potassium phosphate 15 mmol in sodium chloride 0.9 %   . sodium chloride 0.9 % IV   . metroNIDAZOLE (FLAGYL) tablet 500 mg   . Sodium Hypochlorite (DAKIN'S) external solution   . atorvastatin (LIPITOR) tablet 10 mg   . carbidopa-levodopa (SINEMET) 25-100 MG per tablet 1 tablet   . famotidine (PEPCID) tablet 20 mg   . sertraline (ZOLOFT) tablet 50 mg   . acetaminophen (TYLENOL) tablet 1,000 mg   . oxyCODONE (ROXICODONE) IR tablet 5 mg   . oxyCODONE (ROXICODONE) IR tablet 10 mg   . carvedilol (COREG) tablet 3.125 mg   . ipratropium-albuterol (DUONEB) 0.5-2.5 (3) MG/3ML nebulization solution 3 mL   . heparin (porcine) SQ injection 5,000 Units   . ondansetron (ZOFRAN) injection 4 mg   .  piperacillin-tazobactam (ZOSYN) IVPB 3.375 g   . HYDROmorphone PF (DILAUDID) injection 0.5 mg     Admission Medications:  Scheduled Meds  . [COMPLETED] Potassium Phosphate IV in NS 15 mmol at 12/09/12 1433   . metroNIDAZOLE 500 mg at 12/09/12 0016   . Sodium Hypochlorite     . atorvastatin 10 mg at 12/08/12 1941   . carbidopa-levodopa 1 tablet at 12/08/12 1941   . famotidine 20 mg at 12/08/12 1941   . sertraline 50 mg at 12/08/12 0906   . acetaminophen 1,000 mg at 12/09/12 0016   . carvedilol 3.125 mg at 12/08/12 0906   . heparin (porcine) 5,000 Units at 12/09/12 0902   . piperacillin-tazobactam 3.375 g at 12/09/12 1137   IV Meds  . sodium chloride     . sodium chloride 50 mL/hr (12/09/12 0026)   PRN Meds  . oxyCODONE 5 mg at 12/08/12 1358   . oxyCODONE     . ipratropium-albuterol     . ondansetron     . HYDROmorphone PF 0.5 mg at 12/09/12 1218     Anesthesia Evaluation Information Source: per patient, per records  GENERAL  Pertinent (-):  history of anesthetic complications, FamHx of anesthetic complications    HEENT  Denies HEENT issues    PULMONARY    + Smoker (former heavy smoker)            former    + COPD            mild  Pertinent (-):  snoring, sleep apnea CARDIOVASCULAR  Poor<4 METS Exercise Tolerance    + Hypertension            well controlled    + Past MI    + CAD    + Valvular Disease            MR    + Dysrhythmias (VTach)            tachy    + Cardiac device  AICD, pacer    GI/HEPATIC/RENAL  Last PO Intake: >8hr before procedure      + GERD            well controlled    + Alcohol use (h/o EtOH abuse, sober for many years)            >2 drinks/day NEURO/PSYCH    + Psychiatric Issues          anxiety, depression    + Cerebrovascular event    + Neuromuscular disease          Parkinson's    ENDO/OTHER  Pertinent (-):  diabetes mellitus    HEMATOLOGIC    + Blood dyscrasia            hyperlipidemia     Nursing Reported PO Status: Date Last PO Fluids: 12/09/12 0000  Date Last PO Solids:  12/09/12 0000  ______________________________________________________________________  Physical Exam    Airway            Mouth opening: normal            Mallampati: II            TM distance (fb): >3 FB            Neck ROM: full  Dental    Upper: dentures Lower: dentures   Cardiovascular  Normal Exam           Rhythm: regular           Rate: normal  No murmur    General Survey    Normal Exam   Pulmonary    Comment: Very good air movement, coarse "smoker's lungs" BBS    Mental Status   Normal  evaluation    oriented to person, place and time   Comment: Entirely AAOx3, Parkinson's disease obvious         Most Recent Vitals: BP: 112/60 mmHg (12/09/12 1350)  BP MAP : 55 mmHg (12/04/12 1124)  Heart Rate: 68 (12/09/12 1350)  Temp: 37.2 C (99 F) (12/09/12 1305)  Resp: 18 (12/09/12 1350)  Height: 162.6 cm (5\' 4" ) (12/05/12 0226)  Weight: 63.8 kg (140 lb 10.5 oz) (12/03/12 1639)  BMI (Calculated): 0 (12/05/12 0226)  SpO2: 96 % (12/09/12 1350)    Vital Sign Ranges (last 24hrs)  Temp:  [35.9 C (96.6 F)-37.2 C (99 F)] 37.2 C (99 F)  Heart Rate:  [64-74] 68  Resp:  [16-18] 18  BP: (104-140)/(40-76) 112/60 mmHg   O2 Device: None (Room air) (12/09/12 1350)    Most Recent Lab Results   Blood Type  Lab Results   Component Value Date    ABORH A RH POS 12/09/2012    ABS Negative 12/09/2012   CBC  Lab Results   Component Value Date    WBC 20.3* 12/09/2012    HCT 24* 12/09/2012    PLT 267 12/09/2012   Chem-7  Lab Results   Component Value Date    NA 144 12/09/2012    K 3.4 12/09/2012    CL 111* 12/09/2012    CO2 17* 12/09/2012    UN 10 12/09/2012    CREAT 0.76 12/09/2012    GLU 84 12/09/2012    PGLU 104* 07/23/2012   Estimated Creatinine Clearance: 72.35 ml/min (based on Cr of 0.76).  Electrolytes  Lab Results   Component Value Date    CA 7.4* 12/09/2012    MG 1.6 12/09/2012    PO4 2.5* 12/09/2012   Coags  Lab Results  Component Value Date    PTI 13.5* 12/06/2012    INR 1.3* 12/06/2012   LFTs  Lab Results   Component Value  Date    AST 84* 12/03/2012    ALT 30 12/03/2012    ALK 107* 12/03/2012     Bilirubin,Direct   Date Value Range Status   12/03/2012 0.6* 0.0 - 0.3 mg/dL Final        Bili,Indirect   Date Value Range Status   12/03/2012 0.4   Final        Bilirubin,Total   Date Value Range Status   12/03/2012 1.0  0.0 - 1.2 mg/dL Final     Pregnancy Status: Postmenopausal [2]  No LMP recorded. Patient is postmenopausal.    No results found for this basename: PUPT,  UPREG,  SPREG,  HCG1,  BHCG2,  BHCG,  HCGB     ECG Results  Lab Results   Component Value Date/Time    RATE 68 12/03/2012  4:20 PM    RATE 60 07/15/2012  1:44 PM    STATEMENT SINUS RHYTHM 12/03/2012  4:20 PM    STATEMENT FIRST DEGREE AV BLOCK 12/03/2012  4:20 PM    STATEMENT PROBABLE LEFT ATRIAL ABNORMALITY 12/03/2012  4:20 PM    STATEMENT NONSPECIFIC IVCD WITH LAD 12/03/2012  4:20 PM    STATEMENT CONSIDER LEFT VENTRICULAR HYPERTROPHY 12/03/2012  4:20 PM    STATEMENT ATRIAL-VENTRICULAR DUAL-PACED RHYTHM 07/15/2012  1:44 PM     ANES CPM    Radiology: No relevant studies     ________________________________________________________________________  Medical Problems  Patient Active Problem List    Diagnosis Date Noted   . 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 Sepsis 12/03/2012   . 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 C. difficile colitis 07/25/2012   . Compression fracture of L1 lumbar vertebra 07/25/2012   . AKI (acute kidney injury) 07/15/2012   . Unspecified cerebral artery occlusion with cerebral infarction    . Coronary artery disease    . AICD (automatic cardioverter/defibrillator) present    . Hypertension    . Parkinsonism    . CVA (cerebral infarction) 10/23/2011   . ASHD (arteriosclerotic heart disease) 10/23/2011   . MI (mitral incompetence) 10/23/2011   . CHF (congestive heart failure) 10/23/2011   . COPD (chronic obstructive pulmonary disease) 10/23/2011   . ETOH abuse 10/23/2011   . VT (ventricular tachycardia) 10/23/2011     S/p  AICD     . Anemia of chronic disease 10/23/2011       PreOp/PreProcedure Diagnosis (For more detail see procedural consent)            Left lower extremity ischemia and gangrene  Planned Procedure (For more detail see procedural consent)            Left AKA  Plan   ASA Score 3  Anesthetic Plan (general); Induction (routine IV); Airway (LMA); Line ( use current access); Monitoring (standard ASA); Positioning (supine and arms out); Pain (per surgical team); PostOp (PACU)    Informed Consent     Risks:          Risks discussed were commensurate with the plan listed above with the following specific points:  N/V, aspiration, sore throat , damage to:(eyes, nerves, teeth), awareness, unexpected serious injury, allergic Rx    Anesthetic Consent:         Anesthetic plan and risks discussed with:  patient    Blood products Consent:  Use of blood products discussed with: patient and they  Consented to blood products      Attending Attestation: The patient or proxy understand and accept the risks and benefits of the anesthesia plan. By accepting this note, I attest that I have personally performed the history and physical exam and prescribed the anesthetic plan within 48 hours prior to the anesthetic as documented by me above.    Author: Erenest Rasher, MD

## 2012-12-09 NOTE — Progress Notes (Addendum)
Vascular Surgery Post-operative Note    Procedure:     Left above knee amputation, completion from emergent above ankle amputation for left foot wet gangrene    Subjective:  Patient tolerated the surgery well, laying down on bed and has no complaints, adequate pain control, tried some liquids, no n/v    Objective:    Vital signs:  Filed Vitals:    12/09/12 1952 12/09/12 2006 12/09/12 2007 12/09/12 2041   BP: 98/50   112/54   Pulse: 65   67   Temp:    34.8 C (94.7 F)   TempSrc: Oral   Oral   Resp: 20   20   Height:       Weight:       SpO2: 100% 100% 100% 90%       Ins/Outs:    IODETAILS    Intake/Output Summary (Last 24 hours) at 12/09/12 2215  Last data filed at 12/09/12 1952   Gross per 24 hour   Intake 1607.5 ml   Output     50 ml   Net 1557.5 ml         Physical Exam:    General Appearance: alert and oriented, comfortable, in no apparent distress  Cardiac: RRR, no m/r/g  Respiratory: Unlabored work of breathing   Abdomen: soft, non tender, non distended   Extremities: LLE: dressing c/d/i, no strike-through. 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

## 2012-12-09 NOTE — Progress Notes (Signed)
UPDATES TO PATIENT'S CONDITION on the DAY OF SURGERY/PROCEDURE    I. Updates to Patient's Condition (to be completed by a provider privileged to complete a H&P, following reassessment of the patient by the provider):    Ready for OR  II. Procedure Readiness   I have reviewed the patient's H&P and updated condition. By completing and signing this form, I attest that this patient is ready for surgery/procedure.      III. Attestation   I have reviewed the updated information regarding the patient's condition and it is appropriate to proceed with the planned surgery/procedure.    Vincent Gros, MD as of 2:11 PM 12/09/2012

## 2012-12-09 NOTE — Progress Notes (Signed)
Pt denies pain or nausea.oriented to place and self.sleeping comfortably. Report to floor.meets all pacu d/c criteria

## 2012-12-10 ENCOUNTER — Encounter: Payer: Self-pay | Admitting: Vascular Surgery

## 2012-12-10 LAB — VENOUS BLOOD GAS
Base Excess,VENOUS: 0
Bicarbonate,VENOUS: 24 mmol/L (ref 22–30)
CO2 (Calc),VENOUS: 25 mmol/L (ref 22–31)
CO: 1.5 % — ABNORMAL HIGH (ref 0.0–1.4)
FO2 HB,VENOUS: 69 % (ref 63–83)
Hemoglobin: 9.2 g/dL — ABNORMAL LOW (ref 11.2–15.7)
Methemoglobin: 0.3 % (ref 0.0–1.0)
PCO2,VENOUS: 37 mm Hg — ABNORMAL LOW (ref 40–50)
PH,VENOUS: 7.43 — ABNORMAL HIGH (ref 7.32–7.42)
PO2,VENOUS: 36 mm Hg (ref 25–43)

## 2012-12-10 LAB — CBC AND DIFFERENTIAL
Baso # K/uL: 0 10*3/uL (ref 0.0–0.1)
Basophil %: 0 % (ref 0.1–1.2)
Eos # K/uL: 0.6 10*3/uL — ABNORMAL HIGH (ref 0.0–0.4)
Eosinophil %: 3 % (ref 0.7–5.8)
Hematocrit: 28 % — ABNORMAL LOW (ref 34–45)
Hemoglobin: 9.4 g/dL — ABNORMAL LOW (ref 11.2–15.7)
Lymph # K/uL: 1.6 10*3/uL (ref 1.2–3.7)
Lymphocyte %: 8 % — ABNORMAL LOW (ref 19.3–51.7)
MCV: 87 fL (ref 79–95)
Mono # K/uL: 1.8 10*3/uL — ABNORMAL HIGH (ref 0.2–0.9)
Monocyte %: 9 % (ref 4.7–12.5)
Neut # K/uL: 15.6 10*3/uL — ABNORMAL HIGH (ref 1.6–6.1)
Nucl RBC # K/uL: 0.6 10*3/uL
Nucl RBC %: 3 /100 WBC — ABNORMAL HIGH (ref 0.0–0.2)
Platelets: 274 10*3/uL (ref 160–370)
RBC: 3.3 MIL/uL — ABNORMAL LOW (ref 3.9–5.2)
RDW: 16.9 % — ABNORMAL HIGH (ref 11.7–14.4)
Seg Neut %: 79 % — ABNORMAL HIGH (ref 34.0–71.1)
WBC: 19.6 10*3/uL — ABNORMAL HIGH (ref 4.0–10.0)

## 2012-12-10 LAB — BASIC METABOLIC PANEL
Anion Gap: 10 (ref 7–16)
CO2: 20 mmol/L (ref 20–28)
Calcium: 7.5 mg/dL — ABNORMAL LOW (ref 8.6–10.2)
Chloride: 112 mmol/L — ABNORMAL HIGH (ref 96–108)
Creatinine: 0.72 mg/dL (ref 0.51–0.95)
GFR,Black: 100 *
GFR,Caucasian: 86 *
Glucose: 116 mg/dL — ABNORMAL HIGH (ref 60–99)
Lab: 8 mg/dL (ref 6–20)
Potassium: 4.1 mmol/L (ref 3.3–5.1)
Sodium: 142 mmol/L (ref 133–145)

## 2012-12-10 LAB — POLYCHROMASIA

## 2012-12-10 LAB — SURGICAL PATHOLOGY

## 2012-12-10 LAB — MANUAL DIFFERENTIAL

## 2012-12-10 LAB — BANDS: Bands %: 1 % (ref 0–10)

## 2012-12-10 LAB — DIFF BASED ON: Diff Based On: 100 CELLS

## 2012-12-10 LAB — SLIDE NUMBER: Slide # (Heme): 1349

## 2012-12-10 LAB — MACROCYTIC

## 2012-12-10 LAB — TARGET CELLS

## 2012-12-10 LAB — ANISOCYTOSIS

## 2012-12-10 LAB — MAGNESIUM: Magnesium: 1.7 mEq/L (ref 1.3–2.1)

## 2012-12-10 LAB — RED BLOOD CELLS: Red Blood Cells: TRANSFUSED

## 2012-12-10 LAB — PHOSPHORUS: Phosphorus: 2.3 mg/dL — ABNORMAL LOW (ref 2.7–4.5)

## 2012-12-10 MED ORDER — SODIUM CHLORIDE 0.9 % INJ (FLUSH) WRAPPED *I*
10.0000 mL | Status: DC | PRN
Start: 2012-12-10 — End: 2012-12-16

## 2012-12-10 MED ORDER — LIDOCAINE HCL 1 % IJ SOLN
1.0000 mL | Freq: Once | INTRAMUSCULAR | Status: DC
Start: 2012-12-10 — End: 2012-12-10
  Filled 2012-12-10: qty 1

## 2012-12-10 MED ORDER — LIDOCAINE HCL 1 % IJ SOLN
1.0000 mL | Freq: Once | INTRAMUSCULAR | Status: AC
Start: 2012-12-10 — End: 2012-12-10
  Administered 2012-12-10: 1 mL via INTRADERMAL

## 2012-12-10 MED ORDER — SODIUM CHLORIDE 0.9 % INJ (FLUSH) WRAPPED *I*
10.0000 mL | Freq: Three times a day (TID) | Status: DC | PRN
Start: 2012-12-10 — End: 2012-12-16

## 2012-12-10 NOTE — Progress Notes (Addendum)
Vascular Surgery Progress Note    Patient: Michelle [name]    LOS: 7 days    Attending: Vincent Gros, MD     INTERVAL HISTORY & SUBJECTIVE  No acute events overnight. "Doing fine." Pain well-controlled. Had some cookies last night which she tolerated well. Denies nausea/vomiting/CP/SOB/fevers/chills.        OBJECTIVE  Physical Exam:  Temp:  [34.8 C (94.7 F)-37.2 C (99 F)] 36.2 C (97.1 F)  Heart Rate:  [56-73] 62  Resp:  [13-20] 16  BP: (90-140)/(40-76) 90/60 mmHg   GEN: no apparent distress  HEENT: NCAT, face symmetric  CHEST: nonlabored respirations  ABD: soft, nondistended, nontender  NEURO/MOTOR: alert, pleasantly confused  EXTREMITIES: L stump dressing C/D/I. RLE warm.    Intake/Output:  10/28 0700 - 10/29 0659  In: 2466.3 [P.O.:360; I.V.:1956.3]  Out: 50      Medications:  Current Facility-Administered Medications   Medication   . Lactated Ringers Infusion   . metroNIDAZOLE (FLAGYL) tablet 500 mg   . atorvastatin (LIPITOR) tablet 10 mg   . carbidopa-levodopa (SINEMET) 25-100 MG per tablet 1 tablet   . famotidine (PEPCID) tablet 20 mg   . sertraline (ZOLOFT) tablet 50 mg   . acetaminophen (TYLENOL) tablet 1,000 mg   . oxyCODONE (ROXICODONE) IR tablet 5 mg   . oxyCODONE (ROXICODONE) IR tablet 10 mg   . carvedilol (COREG) tablet 3.125 mg   . ipratropium-albuterol (DUONEB) 0.5-2.5 (3) MG/3ML nebulization solution 3 mL   . heparin (porcine) SQ injection 5,000 Units   . ondansetron (ZOFRAN) injection 4 mg   . piperacillin-tazobactam (ZOSYN) IVPB 3.375 g   . HYDROmorphone PF (DILAUDID) injection 0.5 mg         Laboratory values:   Recent Labs      12/10/12   0059  12/09/12   0041   WBC  19.6*  20.3*   Hemoglobin  9.4*  8.0*   Hematocrit  28*  24*   Platelets  274  267    Recent Labs      12/10/12   0059  12/09/12   0041   12/08/12   1110   12/08/12   0016   Sodium  142  144   --   144   --   142   Potassium  4.1  3.4   < >  3.4   < >  2.8*   Chloride  112*  111*   --   112*   --   110*   CO2  20  17*   --   21    --   20   UN  8  10   --   10   --   11   Creatinine  0.72  0.76   --   0.77   --   0.82   Glucose  116*  84   --   137*   --   93   Calcium  7.5*  7.4*   --   7.3*   --   7.5*   Magnesium   --   1.6   --   1.6   < >  1.7   Phosphorus   --   2.5*   --    --    --   1.9*    < > = values in this interval not displayed.    No results found for this basename: AST, ALT, ALK, TB, DB,  in the last 72  hoursNo results found for this basename: TP, ALB, PALB,  in the last 72 hours  No results found for this basename: AMY, LIP,  in the last 72 hours   GLUCOSE: No results found for this basename: PGLU,  in the last 72 hours  Imaging: * Portable Chest Standard Ap Single View    12/07/2012   IMPRESSION:   1. The lungs are hypoventilated.   2. Mild prominence of the pulmonary interstitial markings involving  the lungs bilaterally. Differential includes pulmonary edema and/or  infection.   3. There is patchy increased density and consolidation involving the  lower lung zones bilaterally, left greater than right, similar to the  prior study. Differential includes atelectasis and/or pneumonia.   END OF REPORT       ASSESSMENT  Eveleen Waldridge is a 67 y.o. 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   PLAN   Analgesia: Tylenol ATC, oxy IR prn, dilaudid IV for BT   First stump dressing change by surgical team   Cont Zosyn   Cont Flagyl for C diff   Regular diet   PT    Home meds   DVT ppx -- SQ heparin, SCDs    Dispo: pending medical readiness      Author: Loraine Grip, MD as of: 12/10/2012  at: 7:12 AM     I saw and evaluated the patient. I agree with the resident's/fellow's findings and plan of care as documented above.    Vincent Gros, MD

## 2012-12-10 NOTE — Procedures (Signed)
PICC ASSESSMENT AND PROCEDURE NOTE    Lot #:        [id number]                               Reference # : [id number]    Size:4 French Single Lumen PICC  Side:right    Time out documentation completed in Procedure navigator prior to procedure:  Yes    Indications  Poor Access    Education Provided to Patient /Family : yes  Catheter Associated Blood Stream Infection Education Handout Provided and Explained to Pt/family : yes  Reinforcement Needed : yes  Recommendations : PICC Line  Refer to Radiology :No  Name of Provider Contacted :     Procedure Details Procedure Date :12/10/2012  Time In :2:26 PM  Time Out : 1505  Guide Wire Removed : yes    Findings  Catheter inserted to 39 cm, with 0 cm exposed.  Mid upper arm circumference is 27 cm. Accessed right basilic vein    Complications  none    Condition  Pain Rating (1-10) : 1  Comfort Measures Provided : Pain Medication as Ordered by Provider   Patient Care Booklet Given to Patient : yes  Patient Care Booklet Placed in Chart : no    Plan/Orders  STAT Portable Chest X-Ray Ordered : yes  VBG Sent : yes  Other :   Floor Nurse __  Is aware of need for orders to use the line from Provider     PICC Service  [facility] [phone]  [facility] [phone]    EBL  Estimated Blood Loss : Minimal    Disposition  Pt resting awaiting crx    Prudence Davidson, RN  12/10/2012  2:26 PM

## 2012-12-10 NOTE — Progress Notes (Signed)
Physical Therapy    Hold physical therapy at this time due to pr in sterile procedure in room.  Will continue to follow.    Fritz Pickerel, PT

## 2012-12-10 NOTE — Progress Notes (Signed)
Nutrition Re-screen    67 y.o. female with extensive medical co-morbidities including COPD, CHF (EF 30%), HTN, CAD, CVA, Parkinsonism, and VT (s/p AICD placements) who presents with sepsis from gangrene of the left foot. S/p AKA.  Pt not available at this time, PICC being placed.  Able to confirm no nausea or difficulty with regular consistency despite being edentulous.  She does receive a Regular diet, CAT at NH. No food allergies.    ADMIT WT: Weight: 63.8 kg (140 lb 10.5 oz)  CURRENT WT: Weight: 63.8 kg (140 lb 10.5 oz)    UBW: Last weight per nursing home was 128.7 lbs.    CURRENT DIET ORDER: General    APPETITE/INTAKE: poor intake. 0-25% per doc flow sheets.    NOTES: Patient with weight loss and decreased intake since admission.   Pt known to this service from previous admission. Food preferences on file.    PLAN/RECOMMENDATIONS: Referral to Registered Dietitian  Goal diet- Geriatric with Mighty shake supplements  Document % meal consumed. Formal calorie count not indicated at this time.  Will monitor intake from nursing flow sheets.  Suggest adding a daily MVI.    Melvern Sample, DTR

## 2012-12-10 NOTE — Op Note (Signed)
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    OPERATIVE PROCEDURE:  Left lower extremity above-knee amputation.    INDICATION FOR OPERATION:  Mrs. Ran is a 67 year old woman who presented several days ago with sepsis from a necrotic left foot.  Emergent guillotine amputation was performed at that time.  Operation indicated for completion of amputation.    DESCRIPTION OF OPERATION:  After informed consent was obtained, the patient was brought to the operating room and placed supine on the operative table.  General endotracheal anesthesia was induced.  The patient was prepped and draped in a normal sterile fashion.  Surgical time-out was taken.     A circumferential incision in a fishmouth configuration was made above the level of the knee.  The skin and soft tissues were dissected using electrocautery.  The popliteal artery and vein were individually doubly suture ligated, transected.  Hemostasis was noted to be excellent.  The periosteal elevators were brought in the field and the femur dissected free.  The femur was transected using a Hall saw.  The remainder of the soft tissues were transected using the amputation knife.  Hemostasis was assured with electrocautery and several silk sutures.     The wound was copiously irrigated and found to be hemostatic.  The incision was closed with 0 Vicryl, 2-0 Vicryl, 3-0 Vicryl and staples for the skin.  The patient was awoken from anesthesia and taken to the recovery unit in satisfactory condition.     The patient's condition on completion of operation was satisfactory.    ESTIMATED BLOOD  LOSS:  100 cc.     As the attending physician, I was present and participatory for all portions of the operation.    ANESTHESIA:               ______________________________  Vincent Gros, MD    RG/MODL  DD:  12/10/2012 12:22:14  DT:  12/10/2012 12:41:33  Job #:  [id number]    cc:

## 2012-12-11 LAB — MANUAL DIFFERENTIAL

## 2012-12-11 LAB — CBC AND DIFFERENTIAL
Baso # K/uL: 0 10*3/uL (ref 0.0–0.1)
Basophil %: 0 % (ref 0.1–1.2)
Eos # K/uL: 0.4 10*3/uL (ref 0.0–0.4)
Eosinophil %: 2 % (ref 0.7–5.8)
Hematocrit: 25 % — ABNORMAL LOW (ref 34–45)
Hemoglobin: 8.6 g/dL — ABNORMAL LOW (ref 11.2–15.7)
Lymph # K/uL: 2 10*3/uL (ref 1.2–3.7)
Lymphocyte %: 11 % — ABNORMAL LOW (ref 19.3–51.7)
MCV: 87 fL (ref 79–95)
Mono # K/uL: 1.6 10*3/uL — ABNORMAL HIGH (ref 0.2–0.9)
Monocyte %: 9 % (ref 4.7–12.5)
Neut # K/uL: 14.2 10*3/uL — ABNORMAL HIGH (ref 1.6–6.1)
Nucl RBC # K/uL: 0.1 10*3/uL
Nucl RBC %: 1 /100 WBC — ABNORMAL HIGH (ref 0.0–0.2)
Platelets: 263 10*3/uL (ref 160–370)
RBC: 2.9 MIL/uL — ABNORMAL LOW (ref 3.9–5.2)
RDW: 17.5 % — ABNORMAL HIGH (ref 11.7–14.4)
Seg Neut %: 78 % — ABNORMAL HIGH (ref 34.0–71.1)
WBC: 18.2 10*3/uL — ABNORMAL HIGH (ref 4.0–10.0)

## 2012-12-11 LAB — BASIC METABOLIC PANEL
Anion Gap: 12 (ref 7–16)
CO2: 21 mmol/L (ref 20–28)
Calcium: 7.9 mg/dL — ABNORMAL LOW (ref 8.6–10.2)
Chloride: 110 mmol/L — ABNORMAL HIGH (ref 96–108)
Creatinine: 0.74 mg/dL (ref 0.51–0.95)
GFR,Black: 96 *
GFR,Caucasian: 84 *
Glucose: 98 mg/dL (ref 60–99)
Lab: 7 mg/dL (ref 6–20)
Potassium: 3.3 mmol/L (ref 3.3–5.1)
Sodium: 143 mmol/L (ref 133–145)

## 2012-12-11 LAB — MAGNESIUM: Magnesium: 1.6 mEq/L (ref 1.3–2.1)

## 2012-12-11 LAB — DIFF BASED ON: Diff Based On: 100 CELLS

## 2012-12-11 LAB — HYPOCHROMIA

## 2012-12-11 LAB — POLYCHROMASIA

## 2012-12-11 LAB — ANISOCYTOSIS

## 2012-12-11 LAB — SLIDE NUMBER: Slide # (Heme): 1379

## 2012-12-11 LAB — TARGET CELLS

## 2012-12-11 LAB — PHOSPHORUS: Phosphorus: 2.1 mg/dL — ABNORMAL LOW (ref 2.7–4.5)

## 2012-12-11 LAB — MACROCYTIC

## 2012-12-11 NOTE — Progress Notes (Signed)
Physical Therapy    Initial evaluation completed.      D/c recommendation: SNF     12/11/12 1600   Prior Living    Prior Living Situation Reported by patient   Lives With Adult care facility   Receives Help From Personal care attendant   Type of Home Facility (SNF)   Prior Function Level   Prior Function Level Obtained via chart   Transfer Devices rollator walker   Walking Unable   Additional Comments 1-2 assist transfer to chair   PT Tracking   PT TRACKING PT Assigned   Visit Number   Visit Number 1   Precautions/Observations   Precautions used No   LDA Observation None   Isolation Precautions Contact   Fall Precautions General falls precautions   Pain Assessment   *Is the patient currently in pain? Denies   Cognition   Cognition X   Overall Cognitive Status Impaired   Orientation Oriented to person;Oriented to place;Disoriented to situation;Disoriented to time   Following Commands Follows simple commands with increased time;Follows simple commands with repetition   UE Assessment   UE Assessment Impaired RUE AROM;Impaired LUE AROM  (dystonia RUE > LUE, impaired ADLs with RUE)   LE Assessment   LE Assessment Full AROM RLE  (LLE AKA )   Sensation   Sensation Not tested   Bed Mobility   Bed mobility x   Supine to Sit Head of bed elevated;Side rails up (#);2 person assist;Maximum assist    Sit to Supine Head of bed flat;Side rails down;2 person assist;Maximum assist    Transfers   Transfers x   Bed to Chair Not tested   Sit to Stand 2 person assist;Maximum ;Verbal cues   Stand to sit 2 person assist;Maximum ;Verbal cues   Transfer Assistive Device rolling walker   Additional comments unable to maintain upright posture   Mobility   Mobility Not assessed (comment)   Balance   Balance x   Sitting - Static Supported;Moderate assist   Sitting - Dynamic Not tested   Standing - Static Supported;Max assist   Additional Comments sat EOB x 5 min with mod assist, predominant leftward lean   Additional Comments   Additional  comments poor sitting balance   Assessment   Brief Assessment Appropriate for skilled therapy   Problem List Non-functional L LE;Impaired safety/judgement;Impaired cognition;Impaired endurance;Impaired balance;Impaired transfers;Impaired ambulation;Impaired mobility;Impaired bed mobility;Impaired functional mobility   Patient / Family Goal rehab plan   Plan/Recommendation   Treatment Interventions Restorative PT;Bed mobility training;Transfers training;Balance training;Pt/Family education;Home exercise program instruction;D/C planning;Will work to minimize pain while promoting mobility whenever possible   PT Frequency 5 day/wk;30 minute sessions   Hospital Stay Recommendations ROM daily  (hover mat OOB to chair)   Discharge Recommendations Skilled Nursing Facility Rehab   PT Discharge Equipment Recommended None   Assessment/Recommendations Reviewed With: Patient;Social Worker;Nursing   Time Calculation   PT Timed Codes 0   PT Untimed Codes 15   PT Unbilled Time 0   PT Total Treatment 15   PT Charges   PT [facility] Charges Eval 15     Lianne Cure, PT

## 2012-12-11 NOTE — Progress Notes (Addendum)
Vascular Surgery Progress Note    Patient: Michelle [name]    LOS: 8 days    Attending: Vincent Gros, MD     INTERVAL HISTORY & SUBJECTIVE  No acute events overnight. Sleeping comfortably in bed on evaluation. Reports feeling well. Pain controlled.        OBJECTIVE  Physical Exam:  Temp:  [35.3 C (95.6 F)-37.3 C (99.1 F)] 35.7 C (96.3 F)  Heart Rate:  [62-70] 62  Resp:  [16-18] 16  BP: (118-142)/(52-70) 122/70 mmHg   GEN: no apparent distress  HEENT: NCAT, face symmetric  CHEST: nonlabored respirations  ABD: soft, nondistended, nontender  NEURO/MOTOR: alert, pleasantly confused  EXTREMITIES: L stump incision c/d/i. Dressing appears to have fallen off while patient sleeping. No areas of fluctuance/ischemia. Incision line with very small ooze at medial aspect.     Intake/Output:  10/29 0700 - 10/30 0659  In: 853.8 [P.O.:640; I.V.:113.8]  Out: -      Medications:  Current Facility-Administered Medications   Medication   . sodium chloride 0.9 % flush 10 mL   . sodium chloride 0.9 % flush 10 mL   . sodium chloride 0.9 % flush 10 mL   . sodium chloride 0.9 % flush 10 mL   . metroNIDAZOLE (FLAGYL) tablet 500 mg   . atorvastatin (LIPITOR) tablet 10 mg   . carbidopa-levodopa (SINEMET) 25-100 MG per tablet 1 tablet   . famotidine (PEPCID) tablet 20 mg   . sertraline (ZOLOFT) tablet 50 mg   . acetaminophen (TYLENOL) tablet 1,000 mg   . oxyCODONE (ROXICODONE) IR tablet 5 mg   . oxyCODONE (ROXICODONE) IR tablet 10 mg   . carvedilol (COREG) tablet 3.125 mg   . ipratropium-albuterol (DUONEB) 0.5-2.5 (3) MG/3ML nebulization solution 3 mL   . heparin (porcine) SQ injection 5,000 Units   . ondansetron (ZOFRAN) injection 4 mg   . piperacillin-tazobactam (ZOSYN) IVPB 3.375 g         Laboratory values:   Recent Labs      12/11/12   0037  12/10/12   1512  12/10/12   0059   WBC  18.2*   --   19.6*   Hemoglobin  8.6*  9.2*  9.4*   Hematocrit  25*   --   28*   Platelets  263   --   274    Recent Labs      12/11/12   0354  12/11/12   0007  12/10/12   1512  12/10/12   0059   Sodium   --   143   --   142   Potassium   --   3.3   --   4.1   Chloride   --   110*   --   112*   CO2   --   21   --   20   UN   --   7   --   8   Creatinine   --   0.74   --   0.72   Glucose   --   98   --   116*   Calcium   --   7.9*   --   7.5*   Magnesium  1.6   --   1.7   --    Phosphorus  2.1*   --   2.3*   --     No results found for this basename: AST, ALT, ALK, TB, DB,  in the last 72  hoursNo results found for this basename: TP, ALB, PALB,  in the last 72 hours  No results found for this basename: AMY, LIP,  in the last 72 hours   GLUCOSE: No results found for this basename: PGLU,  in the last 72 hours  Imaging: * Portable Chest Standard Ap Single View    12/10/2012   IMPRESSION:   PICC tip likely in region of cavoatrial junction no obscured by pacer  leads.   New left basilar consolidation likely related to atelectasis versus  pneumonia.   END OF REPORT       ASSESSMENT  Michelle [name] is a 67 y.o. 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     PLAN   Analgesia: Tylenol ATC, oxy IR prn, dilaudid IV for BT   Will re-apply surgical dressing.    Cont Zosyn.   Cont Flagyl for C diff.   Labs reviewed. WBC trending down. Hematocrit also down to 25 from 28. Will trend for now.    Regular diet   PT    Home meds   DVT ppx -- SQ heparin, SCDs    Dispo: pending medical readiness      Author: Glenford Bayley, MD as of: 12/11/2012  at: 7:16 AM   I saw and evaluated the patient. I agree with the resident's/fellow's findings and plan of care as documented above.    Vincent Gros, MD

## 2012-12-11 NOTE — Progress Notes (Signed)
VSS. Pt. Was assessed at the start of shift, no abnormal findings found. Pt. is A&O x3, cooperative and compliant with care. Pt. Was given tylenol at 1630 and reported that pain was reduced but still present. At 1800, Zosyn IVPG was hung and connected to R. PICC, pt. Is on c-diff precautions. Pt. Is comfortable in bed, recently repositioned with call bell in reach and her favorite show on. No complaints were made throughout the shift outside of pain in her LLE.

## 2012-12-11 NOTE — Anesthesia Post-procedure Eval (Signed)
Anesthesia Post-op Note    Patient: Michelle [name]    Procedure(s) Performed: left AKA    Anesthesia type: General    Patient location: Med Surgical Floor    Mental Status: Recovered to baseline    Patient able to participate in this evaluation: yes  Last Vitals: BP: 112/68 mmHg (12/11/12 0856)  BP MAP : 65 mmHg (12/09/12 1730)  Heart Rate: 68 (12/11/12 0856)  Temp: 36 C (96.8 F) (12/11/12 0856)  Resp: 16 (12/11/12 0856)  Height: 162.6 cm (5\' 4" ) (12/05/12 0226)  Weight: 63.8 kg (140 lb 10.5 oz) (12/03/12 1639)  BMI (Calculated): 0 (12/05/12 0226)  SpO2: 96 % (12/11/12 0856)      Post-op vital signs noted above are within patient's normal range  Post-op vitals signs: stable  Respiratory function: baseline    Airway patent: Yes    Cardiovascular and hydration status stable: Yes    Post-Op pain: Adequate analgesia    Post-Op nausea and vomiting: none    Post-Op assessment: no apparent anesthetic complications, tolerated procedure well and no evidence of recall    Complications: none    Attending Attestation: All indicated post anesthesia care provided.  Doing well, offered no complaints, pleased with care.    Author: Erenest Rasher, MD  as of: 12/11/2012  at: 9:46 AM

## 2012-12-11 NOTE — Plan of Care (Signed)
Problem: Malnutrition  Goal: Nutritional status maintained or improved  Outcome: Progressing towards goal  Intervention: Liberalize Diet  Current diet is regular diet. Pt reports she can chew fine without dentures,however she has poor intake.    Recommend dysphasia 3 diet  Formal calorie count not indicated at this time.  Will monitor intake from nursing flow sheets.      Intervention: Camera operator (oral supplement)  Recommend strawberry  mightyshakes tid  Intervention: Meal set up/Assist with meal  Pt requires assistance with meals  Intervention: Other  Recommend daily mvi    Replete phos    Recommend to have pt weighed today. Pt is POD #2 Lt AKA      Comments:                                                   Initial Nutrition Assessment      Last weight   12/11/12 58.5 kg (128 lb 15.5 oz)   wt per N[facility] records 10/14        Estimated Nutrient Needs: Estimated Caloric Needs  Total Caloric Estimated Needs: 1325 to 1500  Needs based on: Kcal/kg - specify (Comment) (25 to 28 kcal/kg)  Needs based on (weight): Adjusted (post aka wt 53kg    10/14 wt per NH records 58.5kg       )  Estimated Protein Needs  Total Protein Estimated Needs: 65 to 75g (1.2 to 1.4g/kg)  Needs based on (weight): Adjusted (post AKA wt  53kg)    Malnutrition  Height: 162.5 cm (5' 3.98")  Height Method: Stated  Weight: 58.5 kg (128 lb 15.5 oz)  Weight Method: Actual (wt per [facility] records 10/14)  BMI (Calculated): 22.2  Weight Loss: 12 pound loss  8.6%  since  07/15/12 per wt flowshheet  wt 140 pounds  Energy Intake: 0 to 50% intake throughout 8 days of admission  Body Fat: mild (dark circles  orbital region)  Muscle Mass: Mild (slight depression in temple region)  Grip Strength: N/A  RUE Edema: Mild pitting, slight indentation      Based on above information pt meets criteria for non specific protein calorie malnutrition.      67 y.o. female from NH with h/o cva,chf, copd,htn,mi,parkinsonism,cad who is POD #2 from L above knee amputation  (completion amputation) following emergent above ankle amputation on 10/23 for sepsis from necrotic foot.  Currently with hypophosphatemia.         (8.6%) , 12 pound wt loss since June.  Pt is consuming 0 to 50% of meals. States she would like strawberry mighty shakes. Nutrition services to see pt for meal choices.  Nutrition recommendations outlined above.    Rayburn Felt RD  Nutrition Services  Pager [phone]

## 2012-12-11 NOTE — Progress Notes (Cosign Needed)
Nutrition Progress Note        O:    Height: 162.5 cm (5' 3.98")  Weight: 58.5 kg (128 lb 15.5 oz)  Weight Loss: 12 pound loss  8.6%  since  07/15/12 per wt flowshheet  wt 140 pounds  BMI (Calculated): 22.2  Energy Intake: 0 to 50% intake throughout 8 days of admission  Body Fat: mild (dark circles  orbital region)  Muscle Mass: Mild (slight depression in temple region)  Grip Strength: N/A      A: Upon nutritional assessment, patient has evidence of: nonspecific protein calorie malnutrition      Plan/Recommendations:  1)Current diet is regular diet. Pt reports she can chew fine without dentures,however she has poor intake.   Recommend dysphasia 3 diet   2)Formal calorie count not indicated at this time. Will monitor intake from nursing flow sheets.   3)Recommend strawberry mightyshakes tid   4)Pt requires assistance with meals   5)Recommend daily mvi   6)Replete phos   7)Recommend to have pt weighed today. Pt is POD #2 Lt AKA     Rayburn Felt RD  Nutrition Services  Pager [phone]        By co-signing this note, I agree with the diagnosis above.

## 2012-12-11 NOTE — Plan of Care (Signed)
Impaired Balance    • Patient will maintain balance to allow for safe mobility and Progressing towards goal        Impaired Mobility (musc)    • Patient's mobility is maintained or improved (musc) Progressing towards goal        Post-Operative Complications    • Prevent post-operative complications Progressing towards goal    • Patient will remain free from symptoms of infection-post op Progressing towards goal        Post-Operative Hemodynamic Stability    • Maintain Hemodynamic Stability Progressing towards goal        Potential for impaired Circulation, Motor, or Sensation    • Patient's CMS status is maintained or improved from baseline Progressing towards goal

## 2012-12-12 LAB — BASIC METABOLIC PANEL
Anion Gap: 10 (ref 7–16)
CO2: 24 mmol/L (ref 20–28)
Calcium: 7.4 mg/dL — ABNORMAL LOW (ref 8.6–10.2)
Chloride: 108 mmol/L (ref 96–108)
Creatinine: 0.86 mg/dL (ref 0.51–0.95)
GFR,Black: 80 *
GFR,Caucasian: 70 *
Glucose: 86 mg/dL (ref 60–99)
Lab: 7 mg/dL (ref 6–20)
Potassium: 2.9 mmol/L — ABNORMAL LOW (ref 3.3–5.1)
Sodium: 142 mmol/L (ref 133–145)

## 2012-12-12 LAB — CBC AND DIFFERENTIAL
Baso # K/uL: 0 10*3/uL (ref 0.0–0.1)
Basophil %: 0.1 % (ref 0.1–1.2)
Eos # K/uL: 0.4 10*3/uL (ref 0.0–0.4)
Eosinophil %: 2.6 % (ref 0.7–5.8)
Hematocrit: 24 % — ABNORMAL LOW (ref 34–45)
Hemoglobin: 8.1 g/dL — ABNORMAL LOW (ref 11.2–15.7)
Lymph # K/uL: 2.7 10*3/uL (ref 1.2–3.7)
Lymphocyte %: 19.7 % (ref 19.3–51.7)
MCV: 87 fL (ref 79–95)
Mono # K/uL: 1.3 10*3/uL — ABNORMAL HIGH (ref 0.2–0.9)
Monocyte %: 9.2 % (ref 4.7–12.5)
Neut # K/uL: 9 10*3/uL — ABNORMAL HIGH (ref 1.6–6.1)
Platelets: 262 10*3/uL (ref 160–370)
RBC: 2.8 MIL/uL — ABNORMAL LOW (ref 3.9–5.2)
RDW: 18.5 % — ABNORMAL HIGH (ref 11.7–14.4)
Seg Neut %: 65.6 % (ref 34.0–71.1)
WBC: 13.7 10*3/uL — ABNORMAL HIGH (ref 4.0–10.0)

## 2012-12-12 LAB — PHOSPHORUS: Phosphorus: 2 mg/dL — ABNORMAL LOW (ref 2.7–4.5)

## 2012-12-12 LAB — MAGNESIUM: Magnesium: 1.5 mEq/L (ref 1.3–2.1)

## 2012-12-12 MED ORDER — POTASSIUM CHLORIDE 20 MEQ/15ML (10%) PO LIQD *A*
40.0000 meq | Freq: Once | ORAL | Status: DC
Start: 2012-12-12 — End: 2012-12-12

## 2012-12-12 MED ORDER — AMOXICILLIN-POT CLAVULANATE 500-125 MG PO TABS *I*
1.0000 | ORAL_TABLET | Freq: Two times a day (BID) | ORAL | Status: DC
Start: 2012-12-12 — End: 2012-12-16
  Administered 2012-12-12 – 2012-12-16 (×9): 1 via ORAL
  Filled 2012-12-12 (×9): qty 1

## 2012-12-12 MED ORDER — POTASSIUM CHLORIDE 20 MEQ/15ML (10%) PO LIQD *A*
40.0000 meq | ORAL | Status: DC
Start: 2012-12-12 — End: 2012-12-12

## 2012-12-12 MED ORDER — POTASSIUM CHLORIDE 20 MEQ/15ML (10%) PO LIQD *A*
40.0000 meq | Freq: Once | ORAL | Status: AC
Start: 2012-12-12 — End: 2012-12-12
  Administered 2012-12-12: 40 meq via ORAL
  Filled 2012-12-12: qty 30

## 2012-12-12 MED ORDER — POTASSIUM & SODIUM PHOSPHATES 280-160-250 MG PO PACK *I*
1.0000 | PACK | Freq: Four times a day (QID) | ORAL | Status: DC
Start: 2012-12-12 — End: 2012-12-16
  Administered 2012-12-12 – 2012-12-16 (×15): 1 via ORAL
  Filled 2012-12-12 (×14): qty 1

## 2012-12-12 MED ORDER — MAGNESIUM SULFATE 2GM IN 50ML STERILE WATER *A*
2000.0000 mg | Freq: Once | INTRAMUSCULAR | Status: AC
Start: 2012-12-12 — End: 2012-12-12
  Administered 2012-12-12: 2000 mg via INTRAVENOUS
  Filled 2012-12-12: qty 50

## 2012-12-12 NOTE — Progress Notes (Signed)
Physical Therapy    Treatment session completed.     12/12/12 1405   PT Tracking   PT TRACKING PT Assigned   Visit Number   Visit Number 2   Precautions/Observations   Precautions used x   Isolation Precautions Contact   Fall Precautions General falls precautions   Other L AKA    Pain Assessment   *Is the patient currently in pain? X   Pain (Before,During, After) Therapy During  (intermittent)   Pain Location Other (Comment);Leg  (residual limb )   Pain Orientation Left   Pain Intervention(s) Repositioned   Cognition   Cognition X   Bed Mobility   Bed mobility x   Supine to Sit 2 person assist;Maximum assist ;Side rails up (#);Head of bed elevated   Sit to Supine 2 person assist;Maximum assist ;Side rails up (#);Head of bed flat   Transfers   Transfers x   Sit to Stand 2 person assist;Maximum    Transfer Assistive Device rolling walker   Additional comments attempted sit to stand from EOB to RW with max assist of 2, unable to clear buttocks from bed.  Pt with very little push off from UEs and R LE.     Mobility   Mobility Not assessed (comment)   Therapeutic Exercises   Exercises Performed LE;UE   Knee Extension, Sitting (LAQ) Right   Knee Ext, Sitting (LAQ)-Assist Active   Knee Ext, Sitting (LAQ)-Reps 10   Elbow Flexion/Bicep Curls Bilaterally   Elbow Flexion/Bicep Curls-Assist Active assisted   Elbow Flexion/Bicep Curls-Reps 10   Hand Pumps Bilaterally   Hand Pumps-Reps 10   Balance   Balance x   Sitting - Static Verbal cues;Minimum assist;Contact guard   Additional Comments Static sitting balance at EOB x 10 minutes total.  Pt with left lateral lean, able to correct at times with verbal, visual and tactile cues but unable to maintain.  Pt requiring close stand by assist and verbal cues initially then as pt began to fatigue required min assist, posterior lean beginning as well when fatigued.     Assessment   Brief Assessment Remains appropriate for skilled therapy   Problem List Impaired mobility;Impaired  endurance;Impaired LE strength;Impaired UE strength;Non-functional L LE;Impaired ambulation;Impaired balance;Impaired transfers;Impaired bed mobility;Impaired functional status;Impaired functional mobility;Pain contributing to impairment   Plan/Recommendation   Treatment Interventions Restorative PT   PT Frequency 5 day/wk;30 minute sessions   Hospital Stay Recommendations Out of bed with nursing assist   Discharge Recommendations Skilled Nursing Facility Rehab   Time Calculation   PT Timed Codes 24   PT Untimed Codes 0   PT Unbilled Time 0   PT Total Treatment 24   PT Charges   PT [facility] Charges Therapeutic Activity (15 min);Therapeutic Exercise (15 min)     Sherwood Gambler, PTA  Pager [phone]

## 2012-12-12 NOTE — Progress Notes (Addendum)
Vascular Surgery Progress Note    Patient: Michelle [name]    LOS: 9 days    Attending: Vincent Gros, MD     INTERVAL HISTORY & SUBJECTIVE  No acute events overnight. Sleeping comfortably in bed on evaluation. Reports feeling well. Pain controlled. Denies CP/COB/fevers/chills. Tolerating diet well.       OBJECTIVE  Physical Exam:  Temp:  [35.9 C (96.6 F)-36.6 C (97.9 F)] 36.1 C (96.9 F)  Heart Rate:  [64-85] 85  Resp:  [16] 16  BP: (106-120)/(60-70) 118/60 mmHg   GEN: no apparent distress  HEENT: NCAT, face symmetric  CHEST: nonlabored respirations  ABD: soft, nondistended, nontender  NEURO/MOTOR: alert, pleasantly confused  EXTREMITIES: L stump dressing C/D/I     Intake/Output:  10/30 0700 - 10/31 0659  In: 410 [P.O.:360]  Out: -      Medications:  Current Facility-Administered Medications   Medication   . magnesium sulfate in sterile water infusion 2,000 mg   . potassium & sodium phosphates (PHOS-NAK) 280-160-250 MG packet 1 packet   . potassium chloride (KAYCIEL) 20 MEQ/15ML (10%) solution 40 mEq    And   . potassium chloride (KAYCIEL) 20 MEQ/15ML (10%) solution 40 mEq   . sodium chloride 0.9 % flush 10 mL   . sodium chloride 0.9 % flush 10 mL   . sodium chloride 0.9 % flush 10 mL   . sodium chloride 0.9 % flush 10 mL   . metroNIDAZOLE (FLAGYL) tablet 500 mg   . atorvastatin (LIPITOR) tablet 10 mg   . carbidopa-levodopa (SINEMET) 25-100 MG per tablet 1 tablet   . famotidine (PEPCID) tablet 20 mg   . sertraline (ZOLOFT) tablet 50 mg   . acetaminophen (TYLENOL) tablet 1,000 mg   . carvedilol (COREG) tablet 3.125 mg   . ipratropium-albuterol (DUONEB) 0.5-2.5 (3) MG/3ML nebulization solution 3 mL   . heparin (porcine) SQ injection 5,000 Units   . ondansetron (ZOFRAN) injection 4 mg   . piperacillin-tazobactam (ZOSYN) IVPB 3.375 g         Laboratory values:   Recent Labs      12/12/12   0414  12/11/12   0037   WBC  13.7*  18.2*   Hemoglobin  8.1*  8.6*   Hematocrit  24*  25*   Platelets  262  263    Recent Labs       12/12/12   0414  12/11/12   0354  12/11/12   0007   Sodium  142   --   143   Potassium  2.9*   --   3.3   Chloride  108   --   110*   CO2  24   --   21   UN  7   --   7   Creatinine  0.86   --   0.74   Glucose  86   --   98   Calcium  7.4*   --   7.9*   Magnesium  1.5  1.6   --    Phosphorus  2.0*  2.1*   --     No results found for this basename: AST, ALT, ALK, TB, DB,  in the last 72 hoursNo results found for this basename: TP, ALB, PALB,  in the last 72 hours  No results found for this basename: AMY, LIP,  in the last 72 hours   GLUCOSE: No results found for this basename: PGLU,  in the last 72 hours  Imaging: *  Portable Chest Standard Ap Single View    12/10/2012   IMPRESSION:   PICC tip likely in region of cavoatrial junction no obscured by pacer  leads.   New left basilar consolidation likely related to atelectasis versus  pneumonia.   END OF REPORT       ASSESSMENT  Michelle [name] is a 67 y.o. 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     PLAN   Analgesia: Tylenol ATC, oxy IR prn, dilaudid IV for BT   Switch to PO abx   Cont Flagyl for C diff.   Labs reviewed. WBC trending down 18 to 13. Hematocrit also down to 24 from 25 from 28. Will trend for now. K 2.9 -- will replete electrolytes    Every other day dressing changes   Regular diet   PT    Home meds   DVT ppx -- SQ heparin, SCDs    Dispo: pending medical readiness      Author: Loraine Grip, MD as of: 12/12/2012  at: 8:13 AM     I saw and evaluated the patient. I agree with the resident's/fellow's findings and plan of care as documented above.    Vincent Gros, MD

## 2012-12-13 LAB — BASIC METABOLIC PANEL
Anion Gap: 7 (ref 7–16)
CO2: 23 mmol/L (ref 20–28)
Calcium: 7.2 mg/dL — ABNORMAL LOW (ref 8.6–10.2)
Chloride: 110 mmol/L — ABNORMAL HIGH (ref 96–108)
Creatinine: 0.8 mg/dL (ref 0.51–0.95)
GFR,Black: 88 *
GFR,Caucasian: 76 *
Glucose: 109 mg/dL — ABNORMAL HIGH (ref 60–99)
Lab: 8 mg/dL (ref 6–20)
Potassium: 4.8 mmol/L (ref 3.3–5.1)
Sodium: 140 mmol/L (ref 133–145)

## 2012-12-13 LAB — MAGNESIUM: Magnesium: 1.8 mEq/L (ref 1.3–2.1)

## 2012-12-13 LAB — CBC AND DIFFERENTIAL
Baso # K/uL: 0 10*3/uL (ref 0.0–0.1)
Basophil %: 0 % (ref 0.1–1.2)
Eos # K/uL: 0.6 10*3/uL — ABNORMAL HIGH (ref 0.0–0.4)
Eosinophil %: 4 % (ref 0.7–5.8)
Hematocrit: 26 % — ABNORMAL LOW (ref 34–45)
Hemoglobin: 8.6 g/dL — ABNORMAL LOW (ref 11.2–15.7)
Lymph # K/uL: 2.1 10*3/uL (ref 1.2–3.7)
Lymphocyte %: 15 % — ABNORMAL LOW (ref 19.3–51.7)
MCV: 88 fL (ref 79–95)
Mono # K/uL: 0.3 10*3/uL (ref 0.2–0.9)
Monocyte %: 2 % — ABNORMAL LOW (ref 4.7–12.5)
Neut # K/uL: 10.8 10*3/uL — ABNORMAL HIGH (ref 1.6–6.1)
Nucl RBC # K/uL: 0 10*3/uL
Nucl RBC %: 0 /100 WBC (ref 0.0–0.2)
Platelets: 279 10*3/uL (ref 160–370)
RBC: 3 MIL/uL — ABNORMAL LOW (ref 3.9–5.2)
RDW: 19.1 % — ABNORMAL HIGH (ref 11.7–14.4)
Seg Neut %: 78 % — ABNORMAL HIGH (ref 34.0–71.1)
WBC: 13.7 10*3/uL — ABNORMAL HIGH (ref 4.0–10.0)

## 2012-12-13 LAB — SLIDE NUMBER: Slide # (Heme): 1446

## 2012-12-13 LAB — BANDS: Bands %: 1 % (ref 0–10)

## 2012-12-13 LAB — PHOSPHORUS: Phosphorus: 2.2 mg/dL — ABNORMAL LOW (ref 2.7–4.5)

## 2012-12-13 LAB — DIFF BASED ON: Diff Based On: 100 CELLS

## 2012-12-13 LAB — MANUAL DIFFERENTIAL

## 2012-12-13 NOTE — Plan of Care (Signed)
Impaired Balance    • Patient will maintain balance to allow for safe mobility and Progressing towards goal        Impaired Mobility (musc)    • Patient's mobility is maintained or improved (musc) Progressing towards goal        Post-Operative Complications    • Prevent post-operative complications Progressing towards goal    • Patient will remain free from symptoms of infection-post op Progressing towards goal        Post-Operative Hemodynamic Stability    • Maintain Hemodynamic Stability Progressing towards goal        Potential for impaired Circulation, Motor, or Sensation    • Patient's CMS status is maintained or improved from baseline Progressing towards goal

## 2012-12-13 NOTE — Progress Notes (Addendum)
Vascular Surgery Progress Note    Patient: Michelle [name]    LOS: 10 days    Attending: Vincent Gros, MD     INTERVAL HISTORY & SUBJECTIVE  No acute events overnight. Pain well controlled. No complaints this AM. Denies CP/COB/fevers/chills. Tolerating diet well.       OBJECTIVE  Physical Exam:  Temp:  [36.2 C (97.2 F)-36.8 C (98.2 F)] 36.2 C (97.2 F)  Heart Rate:  [72-82] 72  Resp:  [16] 16  BP: (100-122)/(52-78) 100/64 mmHg   GEN: no apparent distress  HEENT: NCAT, face symmetric  CHEST: nonlabored respirations  ABD: soft, nondistended, nontender  NEURO/MOTOR: alert, pleasantly confused  EXTREMITIES: L stump dressing C/D/I     Intake/Output:  10/31 0700 - 11/01 0659  In: 710 [P.O.:660]  Out: 0      Medications:  Current Facility-Administered Medications   Medication   . potassium & sodium phosphates (PHOS-NAK) 280-160-250 MG packet 1 packet   . amoxicillin-clavulanate (AUGMENTIN) 500-125 MG per tablet 1 tablet   . sodium chloride 0.9 % flush 10 mL   . sodium chloride 0.9 % flush 10 mL   . sodium chloride 0.9 % flush 10 mL   . sodium chloride 0.9 % flush 10 mL   . metroNIDAZOLE (FLAGYL) tablet 500 mg   . atorvastatin (LIPITOR) tablet 10 mg   . carbidopa-levodopa (SINEMET) 25-100 MG per tablet 1 tablet   . famotidine (PEPCID) tablet 20 mg   . sertraline (ZOLOFT) tablet 50 mg   . acetaminophen (TYLENOL) tablet 1,000 mg   . carvedilol (COREG) tablet 3.125 mg   . ipratropium-albuterol (DUONEB) 0.5-2.5 (3) MG/3ML nebulization solution 3 mL   . heparin (porcine) SQ injection 5,000 Units   . ondansetron (ZOFRAN) injection 4 mg         Laboratory values:   Recent Labs      12/13/12   0006  12/12/12   0414   WBC  13.7*  13.7*   Hemoglobin  8.6*  8.1*   Hematocrit  26*  24*   Platelets  279  262    Recent Labs      12/13/12   0614  12/13/12   0006  12/12/12   0414   Sodium   --   140  142   Potassium   --   4.8  2.9*   Chloride   --   110*  108   CO2   --   23  24   UN   --   8  7   Creatinine   --   0.80  0.86      Glucose   --   109*  86   Calcium   --   7.2*  7.4*   Magnesium  1.8   --   1.5   Phosphorus  2.2*   --   2.0*    No results found for this basename: AST, ALT, ALK, TB, DB,  in the last 72 hoursNo results found for this basename: TP, ALB, PALB,  in the last 72 hours  No results found for this basename: AMY, LIP,  in the last 72 hours   GLUCOSE: No results found for this basename: PGLU,  in the last 72 hours  Imaging: * Portable Chest Standard Ap Single View    12/10/2012   IMPRESSION:   PICC tip likely in region of cavoatrial junction no obscured by pacer  leads.   New left basilar consolidation likely related  to atelectasis versus  pneumonia.   END OF REPORT       ASSESSMENT  Michelle [name] is a 67 y.o. 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     PLAN   Analgesia: Tylenol ATC, oxy IR prn, dilaudid IV for BT   Cont Flagyl for C diff.   Labs reviewed. WBC trending down 18 to 13. Hematocrit also down to 24 from 25 from 28. Will trend for now.    Every other day dressing changes   Regular diet   PT    Home meds   DVT ppx -- SQ heparin, SCDs    Dispo: pending SNF placement      Author: Sherian Rein, MD as of: 12/13/2012  at: 8:34 AM

## 2012-12-14 LAB — BASIC METABOLIC PANEL
Anion Gap: 5 — ABNORMAL LOW (ref 7–16)
CO2: 26 mmol/L (ref 20–28)
Calcium: 7.8 mg/dL — ABNORMAL LOW (ref 8.6–10.2)
Chloride: 107 mmol/L (ref 96–108)
Creatinine: 0.89 mg/dL (ref 0.51–0.95)
GFR,Black: 77 *
GFR,Caucasian: 67 *
Glucose: 94 mg/dL (ref 60–99)
Lab: 7 mg/dL (ref 6–20)
Potassium: 4.5 mmol/L (ref 3.3–5.1)
Sodium: 138 mmol/L (ref 133–145)

## 2012-12-14 LAB — CBC AND DIFFERENTIAL
Baso # K/uL: 0 10*3/uL (ref 0.0–0.1)
Basophil %: 0 % (ref 0.1–1.2)
Eos # K/uL: 0.1 10*3/uL (ref 0.0–0.4)
Eosinophil %: 1 % (ref 0.7–5.8)
Hematocrit: 25 % — ABNORMAL LOW (ref 34–45)
Hemoglobin: 8.4 g/dL — ABNORMAL LOW (ref 11.2–15.7)
Lymph # K/uL: 2.3 10*3/uL (ref 1.2–3.7)
Lymphocyte %: 16 % — ABNORMAL LOW (ref 19.3–51.7)
MCV: 89 fL (ref 79–95)
Mono # K/uL: 0.3 10*3/uL (ref 0.2–0.9)
Monocyte %: 2 % — ABNORMAL LOW (ref 4.7–12.5)
Neut # K/uL: 11.5 10*3/uL — ABNORMAL HIGH (ref 1.6–6.1)
Nucl RBC # K/uL: 0 10*3/uL
Nucl RBC %: 0 /100 WBC (ref 0.0–0.2)
Platelets: 298 10*3/uL (ref 160–370)
RBC: 2.9 MIL/uL — ABNORMAL LOW (ref 3.9–5.2)
RDW: 19.7 % — ABNORMAL HIGH (ref 11.7–14.4)
Seg Neut %: 81 % — ABNORMAL HIGH (ref 34.0–71.1)
WBC: 14.2 10*3/uL — ABNORMAL HIGH (ref 4.0–10.0)

## 2012-12-14 LAB — PHOSPHORUS: Phosphorus: 2.7 mg/dL (ref 2.7–4.5)

## 2012-12-14 LAB — SLIDE NUMBER: Slide # (Heme): 1471

## 2012-12-14 LAB — MAGNESIUM: Magnesium: 1.7 mEq/L (ref 1.3–2.1)

## 2012-12-14 LAB — MANUAL DIFFERENTIAL

## 2012-12-14 LAB — DIFF BASED ON: Diff Based On: 100 CELLS

## 2012-12-14 NOTE — Progress Notes (Addendum)
Vascular Surgery Progress Note    Patient: Michelle [name]    LOS: 11 days    Attending: Vincent Gros, MD     INTERVAL HISTORY & SUBJECTIVE  No acute events overnight. L AKA stump dressing changed yesterday - wound appeared healthy with no signs of dehiscence or purulent drainage. Sutures intact.  Pt tolerating oral intake without any difficulty. Afebrile O/N and WBC stable ( 14.2 from 13.7)       OBJECTIVE  Physical Exam:  Temp:  [35.6 C (96 F)-36.4 C (97.5 F)] 36 C (96.8 F)  Heart Rate:  [60-68] 64  Resp:  [14-16] 16  BP: (100-160)/(56-70) 132/70 mmHg   GEN: NAD  CV: RRR  CHEST: CTA B   ABD: soft, nondistended, nontender  NEURO/MOTOR: alert, pleasantly confused  EXTREMITIES: L stump dressing C/D/I. Wound description from dressing change yesterday as above     Intake/Output:  11/01 0700 - 11/02 0659  In: 240 [P.O.:240]  Out: -      Medications:  Current Facility-Administered Medications   Medication   . potassium & sodium phosphates (PHOS-NAK) 280-160-250 MG packet 1 packet   . amoxicillin-clavulanate (AUGMENTIN) 500-125 MG per tablet 1 tablet   . sodium chloride 0.9 % flush 10 mL   . sodium chloride 0.9 % flush 10 mL   . sodium chloride 0.9 % flush 10 mL   . sodium chloride 0.9 % flush 10 mL   . metroNIDAZOLE (FLAGYL) tablet 500 mg   . atorvastatin (LIPITOR) tablet 10 mg   . carbidopa-levodopa (SINEMET) 25-100 MG per tablet 1 tablet   . famotidine (PEPCID) tablet 20 mg   . sertraline (ZOLOFT) tablet 50 mg   . acetaminophen (TYLENOL) tablet 1,000 mg   . carvedilol (COREG) tablet 3.125 mg   . ipratropium-albuterol (DUONEB) 0.5-2.5 (3) MG/3ML nebulization solution 3 mL   . heparin (porcine) SQ injection 5,000 Units   . ondansetron (ZOFRAN) injection 4 mg         Laboratory values:   Recent Labs      12/13/12   2353  12/13/12   0006   WBC  14.2*  13.7*   Hemoglobin  8.4*  8.6*   Hematocrit  25*  26*   Platelets  298  279    Recent Labs      12/14/12   0546  12/13/12   2353  12/13/12   0614  12/13/12   0006      Sodium   --   138   --   140   Potassium   --   4.5   --   4.8   Chloride   --   107   --   110*   CO2   --   26   --   23   UN   --   7   --   8   Creatinine   --   0.89   --   0.80   Glucose   --   94   --   109*   Calcium   --   7.8*   --   7.2*   Magnesium  1.7   --   1.8   --    Phosphorus  2.7   --   2.2*   --     No results found for this basename: AST, ALT, ALK, TB, DB,  in the last 72 hoursNo results found for this basename: TP, ALB, PALB,  in  the last 72 hours  No results found for this basename: AMY, LIP,  in the last 72 hours   GLUCOSE: No results found for this basename: PGLU,  in the last 72 hours  Imaging: No results found.     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     PLAN   Analgesia: Tylenol ATC, oxy IR prn, dilaudid IV for BT   Cont Flagyl for C diff and Augmentin for soft tissue coverage    Plan for next dressing change on Monday (Xeroform, kerlix and ACE wrape)    Regular diet   PT    Home meds   DVT ppx -- SQ heparin, SCDs    Dispo: pending SNF placement   Please page surgery division 1 pager or surgery moonlighter pager for any issues or questions      Author: Wilhemina Cash, MD as of: 12/14/2012  at: 10:04 AM

## 2012-12-15 LAB — BASIC METABOLIC PANEL
Anion Gap: 7 (ref 7–16)
CO2: 25 mmol/L (ref 20–28)
Calcium: 7.3 mg/dL — ABNORMAL LOW (ref 8.6–10.2)
Chloride: 106 mmol/L (ref 96–108)
Creatinine: 0.93 mg/dL (ref 0.51–0.95)
GFR,Black: 73 *
GFR,Caucasian: 63 *
Glucose: 85 mg/dL (ref 60–99)
Lab: 9 mg/dL (ref 6–20)
Potassium: 4.4 mmol/L (ref 3.3–5.1)
Sodium: 138 mmol/L (ref 133–145)

## 2012-12-15 LAB — EKG 12-LEAD
P: 187 degrees
QRS: -85 degrees
Rate: 63 {beats}/min
Severity: ABNORMAL
Severity: ABNORMAL
Statement: ABNORMAL
Statement: ABNORMAL
T: 167 degrees

## 2012-12-15 LAB — CBC AND DIFFERENTIAL
Baso # K/uL: 0 10*3/uL (ref 0.0–0.1)
Basophil %: 0.2 % (ref 0.1–1.2)
Eos # K/uL: 0.3 10*3/uL (ref 0.0–0.4)
Eosinophil %: 2.1 % (ref 0.7–5.8)
Hematocrit: 25 % — ABNORMAL LOW (ref 34–45)
Hemoglobin: 8.1 g/dL — ABNORMAL LOW (ref 11.2–15.7)
Lymph # K/uL: 3.8 10*3/uL — ABNORMAL HIGH (ref 1.2–3.7)
Lymphocyte %: 26.5 % (ref 19.3–51.7)
MCV: 90 fL (ref 79–95)
Mono # K/uL: 1.3 10*3/uL — ABNORMAL HIGH (ref 0.2–0.9)
Monocyte %: 9.2 % (ref 4.7–12.5)
Neut # K/uL: 8.4 10*3/uL — ABNORMAL HIGH (ref 1.6–6.1)
Platelets: 314 10*3/uL (ref 160–370)
RBC: 2.8 MIL/uL — ABNORMAL LOW (ref 3.9–5.2)
RDW: 20.2 % — ABNORMAL HIGH (ref 11.7–14.4)
Seg Neut %: 59.5 % (ref 34.0–71.1)
WBC: 14.2 10*3/uL — ABNORMAL HIGH (ref 4.0–10.0)

## 2012-12-15 LAB — MAGNESIUM: Magnesium: 1.6 mEq/L (ref 1.3–2.1)

## 2012-12-15 LAB — SURGICAL PATHOLOGY

## 2012-12-15 LAB — PHOSPHORUS: Phosphorus: 2.9 mg/dL (ref 2.7–4.5)

## 2012-12-15 NOTE — Continuity of Care (Signed)
NEW [facility] DEPARTMENT OF HEALTH  [facility]-Division of Quality and Surveillance for Nursing Homes and ICFs/MR  Patient Name  Michelle [name] MR Number  [id number] Account Number  [id number] Birthdate  [id number]    [facility]                Patient Review Instrument  (HC-PRI)               RUG II:  RB6     I. ADMINISTRATIVE DATA     1. Operating Certificate Number  [id number] 2. Social Security Number  [id number]   3. Official Name of Hospital Completing this Review  [facility] SERVICE AREA    4A. Patient Name  Michelle [name] 10. Sex  female   4B. [place] Residence  [place] 11A. Date of Hospital Admission or Initial Agency Visit  12/03/2012   5. Date of PRI Completion  December 15, 2012 11B. Date of Alternate level of Care Status in Hospital (if applicable)    6. Account Number  [id number] 12. Medicaid Number  [name],    MCRC,    [id number]  MEDICARE IME ONLY,    MCR,    [id number]  MEDICAID,    MCD,    [id number]       7. Hospital Room Number  [phone] 13. Medicare Number     8. Name of Unit/Division/Building  [id number] 14. Primary Payor     9. Date of Birth  [phone] 76. Reason for [facility] Completion  1 - [facility] application from hospital     II. MEDICAL EVENTS     16. Decubitus Level:        Location:  No reddened skin or breakdown     17. MEDICAL CONDITIONS During the past week:  1=Yes  2=No 18. MEDICAL TREATMENTS 1=Yes  2=No   A. Comatose No A. Tracheostomy Care/Suct No         B. Dehydration No B. Suctioning/General No         C. Internal Bleeding No C. Oxygen (Daily) No              D. Stasis Ulcer No D Respiratory Care (Daily) No         E. Terminally Ill No E. Nasal Gastric Feeding No         F. Contractures No F. Parenteral Feeding No         G. Diabetes Mellitus No G. Wound Care No         H. Urinary Tract Infection No H. Chemotherapy No         I. HIV Infection Symptomatic No I. Transfusion No         J. Accident No J. Dialysis No         K. Ventilator Dependent No K. Bowel and Bladder Rehab No           L.  Catheter (Indwelling or External) No              M. Physical Restraints  (Daytime) No             Adapted from [id number]    Version: 002F  Review Type: Update review  12/15/2012  2 of 8    NEW [facility] DEPARTMENT OF HEALTH  [facility]-Division of Quality and Surveillance for Nursing Homes and ICFs/MR  Patient Name  Michelle [name] MR Number  [id number] Account Number  [id number] Birthdate  [phone]    [facility] and [employer]  Patient Review Instrument  (HC-PRI)               III. Activities of Daily Living     19. Eating 2-Requires intermittent supervision (that is, verbal encouragement/guidance) and/or minimal physical assistance with minor parts of eating, such as cutting food, buttering bread or opening milk carton.    20. Mobility 5-Is wheeled, chairfast or bedfast. Relies on someone else to move about, if at all.    21. Transfer 4-Requires two people to provide constant supervision and/or physically lift.  May need lifting equipment.    22. Toileting 4-Incontinent of bowel and/or bladder and is not taken to a bathroom.      IV. BEHAVIORS     23. Verbal Disruption No known history   24. Physical Agression No known history   25. Disruptive, Infantile or Socially Inappropriate Behavior No known history   26. Hallucinations No     V. SPECIALIZED SERVICES     27A. Physical Therapy  27B. Occupational Therapy    Level: Restorative therapy- requires and is currently receiving physical and/or occupational therapy for the past week Level: Does not receive.   Actual days/week:2 Yes - equal or greater than 5 times/week Actual days/week::      Actual hours/week:.75 Yes - equal to or greater than 2.5 hrs/wk Actual hours/week:        28. Number of Physician Visits - 0     VI. DIAGNOSIS     29. 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 PLAN OF CARE SUMMARY     30. Primary Diagnosis: Encounter Diagnosis   Name Primary?   . Ischemic necrosis of foot  S/P emergent L Above Ankle Amp 12/03/12 & L Completion AKA  12/09/12  Yes            PMH: See below         PSH:  has past surgical history that includes ICD; hemicolectomy (Left); Cardiac defibrillator placement; Pacemaker insertion; and HH consult to PICC placement (12/10/2012).   31A. Rehabilitation Potential: Treatment Interventions: Restorative PT  PT Frequency: 5 day/wk;30 minute sessions  Hospital Stay Recommendations: Out of bed with nursing assist  Discharge Recommendations: Skilled Nursing Facility Rehab  PT Discharge Equipment Recommended: None  Assessment/Recommendations Reviewed With:: Patient;Social Worker;Nursing.     .                 Comment:    31B. Current therapy care plan: Treatment Interventions: Restorative PT  PT Frequency: 5 day/wk;30 minute sessions  Hospital Stay Recommendations: Out of bed with nursing assist  Discharge Recommendations: Skilled Nursing Facility Rehab  PT Discharge Equipment Recommended: None  Assessment/Recommendations Reviewed With:: Patient;Social Worker;Nursing            Comment:    32. Medications: See below   33A. Allergies: Review of patient's allergies indicates no known allergies (drug, envir, food or latex).   33B: Treatments: PICC Care, Bed Alarm, Elevate HOB   33C: Abnormal Labs: See below   33D: Precautions:  Fall, Contact           Comment:    33E: Pacemaker Permanent   28F: Diet: Regular /w Mighty Shake TID          34. Race/Ethnic Group White or Caucasian [1]     35. Certification    I have personally observed/interviewed this patient and completed this H/C PRI. No - Staff/Chart  I certify that the information contained herein is a true abstract of this patient's condition and medical record. Yes   Certified Assessor: Max Sane, RN   Identification Number: [id number]             Adapted from [id number]    Version: 002F  Review Type: Update review  12/15/2012  1 of 1    PMH:    Past Medical History   Diagnosis Date   . CVA (cerebral infarction) 10/23/2011   . ASHD (arteriosclerotic heart disease) 10/23/2011    . MI (mitral incompetence) 10/23/2011   . CHF (congestive heart failure) 10/23/2011     EF 30%   . COPD (chronic obstructive pulmonary disease) 10/23/2011   . VT (ventricular tachycardia) 10/23/2011     S/p AICD   . Anemia of chronic disease 10/23/2011   . HTN (hypertension) 10/23/2011   . Parkinsonism    . Coronary artery disease    . AICD (automatic cardioverter/defibrillator) present    . Myocardial infarction june 2013   . Alcohol abuse, in remission  C. Diff Colitis        Medications:    Current Facility-Administered Medications   Medication Dose Route Frequency   . potassium & sodium phosphates (PHOS-NAK) 280-160-250 MG packet 1 packet  1 packet Oral 4x Daily AC & HS   . amoxicillin-clavulanate (AUGMENTIN) 500-125 MG per tablet 1 tablet  1 tablet Oral Q12H SCH   . sodium chloride 0.9 % flush 10 mL  10 mL Intracatheter Q8H PRN   . sodium chloride 0.9 % flush 10 mL  10 mL Intracatheter PRN   . sodium chloride 0.9 % flush 10 mL  10 mL Intracatheter Q8H PRN   . sodium chloride 0.9 % flush 10 mL  10 mL Intracatheter PRN   . metroNIDAZOLE (FLAGYL) tablet 500 mg  500 mg Oral Q8H SCH   . atorvastatin (LIPITOR) tablet 10 mg  10 mg Oral Daily with dinner   . carbidopa-levodopa (SINEMET) 25-100 MG per tablet 1 tablet  1 tablet Oral BID   . famotidine (PEPCID) tablet 20 mg  20 mg Oral BID   . sertraline (ZOLOFT) tablet 50 mg  50 mg Oral Daily   . acetaminophen (TYLENOL) tablet 1,000 mg  1,000 mg Oral TID   . carvedilol (COREG) tablet 3.125 mg  3.125 mg Oral Daily   . ipratropium-albuterol (DUONEB) 0.5-2.5 (3) MG/3ML nebulization solution 3 mL  3 mL Nebulization 4x Daily PRN   . heparin (porcine) SQ injection 5,000 Units  5,000 Units Subcutaneous Q8H   . ondansetron (ZOFRAN) injection 4 mg  4 mg Intravenous Q4H PRN       Abnormal Labs:    Recent Results (from the past 72 hour(s))   BASIC METABOLIC PANEL    Collection Time     12/13/12 12:06 AM       Result Value Range    Glucose 109 (*) 60 - 99 mg/dL    Sodium 981  191 - 478  mmol/L    Potassium 4.8  3.3 - 5.1 mmol/L    Chloride 110 (*) 96 - 108 mmol/L    CO2 23  20 - 28 mmol/L    Anion Gap 7  7 - 16    UN 8  6 - 20 mg/dL    Creatinine 2.95  6.21 - 0.95 mg/dL    GFR,Caucasian 76      GFR,Black 88      Calcium 7.2 (*) 8.6 -  10.2 mg/dL   CBC AND DIFFERENTIAL    Collection Time     12/13/12 12:06 AM       Result Value Range    WBC 13.7 (*) 4.0 - 10.0 THOU/uL    RBC 3.0 (*) 3.9 - 5.2 MIL/uL    Hemoglobin 8.6 (*) 11.2 - 15.7 g/dL    Hematocrit 26 (*) 34 - 45 %    MCV 88  79 - 95 fL    RDW 19.1 (*) 11.7 - 14.4 %    Platelets 279  160 - 370 THOU/uL    Seg Neut % 78.0 (*) 34.0 - 71.1 %    Lymphocyte % 15.0 (*) 19.3 - 51.7 %    Monocyte % 2.0 (*) 4.7 - 12.5 %    Eosinophil % 4.0  0.7 - 5.8 %    Basophil % 0.0  0.1 - 1.2 %    Neut # K/uL 10.8 (*) 1.6 - 6.1 THOU/uL    Lymph # K/uL 2.1  1.2 - 3.7 THOU/uL    Mono # K/uL 0.3  0.2 - 0.9 THOU/uL    Eos # K/uL 0.6 (*) 0.0 - 0.4 THOU/uL    Baso # K/uL 0.0  0.0 - 0.1 THOU/uL    Nucl RBC % 0.0  0.0 - 0.2 /100 WBC    Nucl RBC # K/uL 0.0     BANDS    Collection Time     12/13/12 12:06 AM       Result Value Range    Bands % 1  0 - 10 %   DIFF BASED ON    Collection Time     12/13/12 12:06 AM       Result Value Range    Diff Based On 100     SLIDE NUMBER    Collection Time     12/13/12 12:06 AM       Result Value Range    Slide # (Heme) 1446     MANUAL DIFFERENTIAL    Collection Time     12/13/12 12:06 AM       Result Value Range    Manual DIFF RESULTS     MAGNESIUM    Collection Time     12/13/12  6:14 AM       Result Value Range    Magnesium 1.8  1.3 - 2.1 mEq/L   PHOSPHORUS    Collection Time     12/13/12  6:14 AM       Result Value Range    Phosphorus 2.2 (*) 2.7 - 4.5 mg/dL   BASIC METABOLIC PANEL    Collection Time     12/13/12 11:53 PM       Result Value Range    Glucose 94  60 - 99 mg/dL    Sodium 161  096 - 045 mmol/L    Potassium 4.5  3.3 - 5.1 mmol/L    Chloride 107  96 - 108 mmol/L    CO2 26  20 - 28 mmol/L    Anion Gap 5 (*) 7 - 16    UN 7  6 - 20  mg/dL    Creatinine 4.09  8.11 - 0.95 mg/dL    GFR,Caucasian 67      GFR,Black 77      Calcium 7.8 (*) 8.6 - 10.2 mg/dL   CBC AND DIFFERENTIAL    Collection Time     12/13/12 11:53 PM       Result Value  Range    WBC 14.2 (*) 4.0 - 10.0 THOU/uL    RBC 2.9 (*) 3.9 - 5.2 MIL/uL    Hemoglobin 8.4 (*) 11.2 - 15.7 g/dL    Hematocrit 25 (*) 34 - 45 %    MCV 89  79 - 95 fL    RDW 19.7 (*) 11.7 - 14.4 %    Platelets 298  160 - 370 THOU/uL    Seg Neut % 81.0 (*) 34.0 - 71.1 %    Lymphocyte % 16.0 (*) 19.3 - 51.7 %    Monocyte % 2.0 (*) 4.7 - 12.5 %    Eosinophil % 1.0  0.7 - 5.8 %    Basophil % 0.0  0.1 - 1.2 %    Neut # K/uL 11.5 (*) 1.6 - 6.1 THOU/uL    Lymph # K/uL 2.3  1.2 - 3.7 THOU/uL    Mono # K/uL 0.3  0.2 - 0.9 THOU/uL    Eos # K/uL 0.1  0.0 - 0.4 THOU/uL    Baso # K/uL 0.0  0.0 - 0.1 THOU/uL    Nucl RBC % 0.0  0.0 - 0.2 /100 WBC    Nucl RBC # K/uL 0.0     SLIDE NUMBER    Collection Time     12/13/12 11:53 PM       Result Value Range    Slide # (Heme) 1471     DIFF BASED ON    Collection Time     12/13/12 11:53 PM       Result Value Range    Diff Based On 100     MANUAL DIFFERENTIAL    Collection Time     12/13/12 11:53 PM       Result Value Range    Manual DIFF RESULTS     MAGNESIUM    Collection Time     12/14/12  5:46 AM       Result Value Range    Magnesium 1.7  1.3 - 2.1 mEq/L   PHOSPHORUS    Collection Time     12/14/12  5:46 AM       Result Value Range    Phosphorus 2.7  2.7 - 4.5 mg/dL   BASIC METABOLIC PANEL    Collection Time     12/15/12 12:34 AM       Result Value Range    Glucose 85  60 - 99 mg/dL    Sodium 213  086 - 578 mmol/L    Potassium 4.4  3.3 - 5.1 mmol/L    Chloride 106  96 - 108 mmol/L    CO2 25  20 - 28 mmol/L    Anion Gap 7  7 - 16    UN 9  6 - 20 mg/dL    Creatinine 4.69  6.29 - 0.95 mg/dL    GFR,Caucasian 63      GFR,Black 73      Calcium 7.3 (*) 8.6 - 10.2 mg/dL   CBC AND DIFFERENTIAL    Collection Time     12/15/12 12:34 AM       Result Value Range    WBC 14.2 (*) 4.0 - 10.0 THOU/uL     RBC 2.8 (*) 3.9 - 5.2 MIL/uL    Hemoglobin 8.1 (*) 11.2 - 15.7 g/dL    Hematocrit 25 (*) 34 - 45 %    MCV 90  79 - 95 fL    RDW 20.2 (*) 11.7 - 14.4 %  Platelets 314  160 - 370 THOU/uL    Seg Neut % 59.5  34.0 - 71.1 %    Lymphocyte % 26.5  19.3 - 51.7 %    Monocyte % 9.2  4.7 - 12.5 %    Eosinophil % 2.1  0.7 - 5.8 %    Basophil % 0.2  0.1 - 1.2 %    Neut # K/uL 8.4 (*) 1.6 - 6.1 THOU/uL    Lymph # K/uL 3.8 (*) 1.2 - 3.7 THOU/uL    Mono # K/uL 1.3 (*) 0.2 - 0.9 THOU/uL    Eos # K/uL 0.3  0.0 - 0.4 THOU/uL    Baso # K/uL 0.0  0.0 - 0.1 THOU/uL   MAGNESIUM    Collection Time     12/15/12  6:34 AM       Result Value Range    Magnesium 1.6  1.3 - 2.1 mEq/L        *Note: Due to a large number of results for the requested time period, some results have not been displayed. A complete set of results can be found in Results Review.

## 2012-12-15 NOTE — Progress Notes (Addendum)
Vascular Surgery Progress Note    Patient: Michelle [name]    LOS: 12 days    Attending: Vincent Gros, MD     INTERVAL HISTORY & SUBJECTIVE  No acute events overnight. No complaints this AM. Tolerating PO. Denies CP, SOB, N/V/D. Denies fevers or chills.       OBJECTIVE  Physical Exam:  Temp:  [35.9 C (96.7 F)-36.8 C (98.2 F)] 36.6 C (97.9 F)  Heart Rate:  [64-68] 68  Resp:  [16] 16  BP: (124-160)/(60-70) 132/68 mmHg   GEN: NAD  CV: RRR  CHEST: CTA B   ABD: soft, nondistended, nontender  NEURO/MOTOR: alert, pleasantly confused  EXTREMITIES: L stump dressing C/D/I.   Intake/Output:  11/02 0700 - 11/03 0659  In: 810 [P.O.:810]  Out: -      Medications:  Current Facility-Administered Medications   Medication   . potassium & sodium phosphates (PHOS-NAK) 280-160-250 MG packet 1 packet   . amoxicillin-clavulanate (AUGMENTIN) 500-125 MG per tablet 1 tablet   . sodium chloride 0.9 % flush 10 mL   . sodium chloride 0.9 % flush 10 mL   . sodium chloride 0.9 % flush 10 mL   . sodium chloride 0.9 % flush 10 mL   . metroNIDAZOLE (FLAGYL) tablet 500 mg   . atorvastatin (LIPITOR) tablet 10 mg   . carbidopa-levodopa (SINEMET) 25-100 MG per tablet 1 tablet   . famotidine (PEPCID) tablet 20 mg   . sertraline (ZOLOFT) tablet 50 mg   . acetaminophen (TYLENOL) tablet 1,000 mg   . carvedilol (COREG) tablet 3.125 mg   . ipratropium-albuterol (DUONEB) 0.5-2.5 (3) MG/3ML nebulization solution 3 mL   . heparin (porcine) SQ injection 5,000 Units   . ondansetron (ZOFRAN) injection 4 mg         Laboratory values:   Recent Labs      12/15/12   0034  12/13/12   2353   WBC  14.2*  14.2*   Hemoglobin  8.1*  8.4*   Hematocrit  25*  25*   Platelets  314  298    Recent Labs      12/15/12   0034  12/14/12   0546  12/13/12   2353  12/13/12   0614   Sodium  138   --   138   --    Potassium  4.4   --   4.5   --    Chloride  106   --   107   --    CO2  25   --   26   --    UN  9   --   7   --    Creatinine  0.93   --   0.89   --    Glucose  85   --    94   --    Calcium  7.3*   --   7.8*   --    Magnesium   --   1.7   --   1.8   Phosphorus   --   2.7   --   2.2*    No results found for this basename: AST, ALT, ALK, TB, DB,  in the last 72 hoursNo results found for this basename: TP, ALB, PALB,  in the last 72 hours  No results found for this basename: AMY, LIP,  in the last 72 hours   GLUCOSE: No results found for this basename: PGLU,  in the last 72 hours  Imaging: No results found.     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     PLAN   Analgesia: Tylenol ATC, oxy IR prn, dilaudid IV for BT   Cont Flagyl for C diff and Augmentin for soft tissue coverage    Plan for dressing change today (Xeroform, kerlix and ACE wrape)    Regular diet   PT    Home meds   DVT ppx -- SQ heparin, SCDs    Dispo: pending SNF placement   Please page surgery division 1 pager or surgery moonlighter pager for any issues or questions      Author: Sherian Rein, MD as of: 12/15/2012  at: 6:10 AM       I saw and evaluated the patient. I agree with the resident's/fellow's findings and plan of care as documented above.    Vincent Gros, MD

## 2012-12-15 NOTE — Progress Notes (Signed)
SW received a call from [facility] R in admissions [phone] to inquire if the pt will be on PO meds upon d/c. SW spoke with PA who states shes on PO meds now and picc will be pulled for d/c.  [name] LMSW  4:02 PM  12/15/2012

## 2012-12-15 NOTE — Progress Notes (Signed)
Physical Therapy    Treatment session completed.     12/15/12 1406   PT Tracking   PT TRACKING PT Assigned   Visit Number   Visit Number 3   Precautions/Observations   Precautions used x   Isolation Precautions Contact   Fall Precautions General falls precautions   Other L AKA   Pain Assessment   *Is the patient currently in pain? Denies   Bed Mobility   Bed mobility Not tested   Additional comments pt declined bed mobility and transfer attempts at this time   Transfers   Transfers Not tested   Mobility   Mobility Not assessed (comment)   Therapeutic Exercises   Exercises Performed LE;UE   Ankle Pumps Right   Ankle Pumps-Assist Active   Ankle Pumps-Reps 20   Heel Slides, Supine Right   Heel Slides, Supine-Assist Active assisted   Heel Slides, Supine-Reps 10   Hip Abduction, Supine Bilaterally   Hip Abduction, Supine-Assist Active assisted   Hip Abduction, Supine-Reps 10   Knee Extension, Supine (SAQ) Right   Knee Ext, Supine (SAQ)-Assist Active   Knee Ext, Supine (SAQ)-Reps 20   SLR Bilaterally   SLR-Assist Active assisted   SLR-Reps 10   Elbow Flexion/Bicep Curls Bilaterally   Elbow Flexion/Bicep Curls-Assist Active   Elbow Flexion/Bicep Curls-Reps 20   Hand Pumps Bilaterally   Hand Pumps-Reps 20   Shoulder Flexion Bilaterally   Shoulder Flexion-Assist Active assisted   Shoulder Flexion-Reps 10   Balance   Balance Not tested   Assessment   Brief Assessment Remains appropriate for skilled therapy   Problem List Impaired balance;Impaired functional status;Impaired mobility;Impaired functional mobility;Impaired ambulation;Impaired LE strength;Impaired UE strength;Non-functional L LE;Impaired endurance;Impaired transfers;Impaired bed mobility;Pain contributing to impairment   Plan/Recommendation   Treatment Interventions Restorative PT   PT Frequency 5 day/wk;30 minute sessions   Hospital Stay Recommendations Out of bed with nursing assist   Discharge Recommendations Skilled Nursing Facility Rehab   Time Calculation    PT Timed Codes 26   PT Untimed Codes 0   PT Unbilled Time 0   PT Total Treatment 26   PT Charges   PT [facility] Charges Therapeutic Exercise (15 minx2)     Sherwood Gambler, PTA  Pager [phone]

## 2012-12-15 NOTE — Progress Notes (Signed)
Brief Progress Note:  LLE dressings removed at this time. Incision C/D/I with staples in place. No signs of infection. Wound covered with xeroform, kerlix wrap and ACE. She tolerated the dressing change well.

## 2012-12-15 NOTE — Progress Notes (Addendum)
[facility]  Transportation Request Form / Physician Certification Statement   Please fax request with face sheet to [facility] Transportation: Fax [phone], Phone [phone] or [facility]: [phone], Phone [phone]    Patient Name:  Michelle [name]     Date of Birth:  [birth year]    Date of Service: 12/16/12  Time of pick up: 12:00 pm Patient Location:  W616/W616-01       MR#  [id number] Patient Destination: [facility]   Number of steps into house?: 0  Requestor's Name: [name] Call Back [phone]  Payor : Medicaid  Transport for (chechat type of treatment or service, at least one):    [x]  Discharge              []  Diagnostic Test      []  Evaluation Test         []  Procedure      Specify what type of treatment or service: Long term care Facility   Is this treatment or service available at sending facility?:      []  Yes    [x]  No  Requested Mode of Transport  []  One man Chairmobile** []  Two man Chairmobile**  (** See instruction  form)  Leg extension required:     []  Yes  []  No Patient has own chair:  []  Yes    []  N   Wheelchair size:        []  Standard []  X-wide []  XX-wide  []  Round Trip []  One Way  [x]  Stretcher [name] (no medical attention needed)    []  [facility]  []  [facility]  VENDOR: ____RMT_____   1. Medical condition that necessitates this mode of transport (i.e. oxygen, bed ridden, etc.):  Pt is unable to walk at baseline. She has poor trunk control for a wheelchair.  2. What medical services are to be provided by crew?   []  Oxygen Monitoring:  Amount:      LPM  Can patient self-administer oxygen?  []  Yes []  No - What is limitation?        Does patient have own portable tank?    []  Yes     []  No   []  Airway Monitoring []  Monitor IV infusion []  Suctioning   []  Cardiac Monitoring   []  Vital Signs []  PRN Meds  []  Other_______________________________  [x]  None  3. Infection control needs (i.e. ORSA/VRE/Cdiff)       [x]  Yes    []  No  4. What specific handling is required?    [x]   Positioning []  Orthopedic Device    []  Restraints []  Other:        Reason for above:  []  Flight Risk     []  Severe Pain []  Morbid Obesity       [x]  Fall Precaution   []  Ortho/Spine Precaution         []  Decubitis > Stage 2 []  Risk of injury to self/others  5.  Patient mental status? [x]  Normal cognition []  Disoriented      Specify:        []  Psychological Disorder Specify:     6. At time of transport is bed confinement ordered?  []  Yes     [x]  No              Is patient bed confined?    []  Yes     [x]  No    Medical condition for bed confinement:     Name:  Orland Jarred  Gershon Mussel         Date:  December 16, 2012        Signature:___________________________________________________

## 2012-12-16 LAB — CBC AND DIFFERENTIAL
Baso # K/uL: 0 10*3/uL (ref 0.0–0.1)
Basophil %: 0.3 % (ref 0.1–1.2)
Eos # K/uL: 0.3 10*3/uL (ref 0.0–0.4)
Eosinophil %: 2.1 % (ref 0.7–5.8)
Hematocrit: 25 % — ABNORMAL LOW (ref 34–45)
Hemoglobin: 8 g/dL — ABNORMAL LOW (ref 11.2–15.7)
Lymph # K/uL: 3.4 10*3/uL (ref 1.2–3.7)
Lymphocyte %: 26.4 % (ref 19.3–51.7)
MCV: 91 fL (ref 79–95)
Mono # K/uL: 1.3 10*3/uL — ABNORMAL HIGH (ref 0.2–0.9)
Monocyte %: 9.8 % (ref 4.7–12.5)
Neut # K/uL: 7.6 10*3/uL — ABNORMAL HIGH (ref 1.6–6.1)
Platelets: 311 10*3/uL (ref 160–370)
RBC: 2.7 MIL/uL — ABNORMAL LOW (ref 3.9–5.2)
RDW: 20.5 % — ABNORMAL HIGH (ref 11.7–14.4)
Seg Neut %: 58.4 % (ref 34.0–71.1)
WBC: 13 10*3/uL — ABNORMAL HIGH (ref 4.0–10.0)

## 2012-12-16 LAB — BASIC METABOLIC PANEL
Anion Gap: 7 (ref 7–16)
CO2: 26 mmol/L (ref 20–28)
Calcium: 7.7 mg/dL — ABNORMAL LOW (ref 8.6–10.2)
Chloride: 103 mmol/L (ref 96–108)
Creatinine: 0.99 mg/dL — ABNORMAL HIGH (ref 0.51–0.95)
GFR,Black: 68 *
GFR,Caucasian: 59 * — AB
Glucose: 89 mg/dL (ref 60–99)
Lab: 9 mg/dL (ref 6–20)
Potassium: 4.5 mmol/L (ref 3.3–5.1)
Sodium: 136 mmol/L (ref 133–145)

## 2012-12-16 LAB — MAGNESIUM: Magnesium: 1.5 mEq/L (ref 1.3–2.1)

## 2012-12-16 LAB — PHOSPHORUS: Phosphorus: 3.2 mg/dL (ref 2.7–4.5)

## 2012-12-16 MED ORDER — AMOXICILLIN-POT CLAVULANATE 500-125 MG PO TABS *I*
1.0000 | ORAL_TABLET | Freq: Two times a day (BID) | ORAL | Status: AC
Start: 2012-12-12 — End: 2012-12-26

## 2012-12-16 MED ORDER — MIGHTY SHAKES NUTRITIONAL SUPPLEMENT *A*
8.0000 [oz_av] | Freq: Three times a day (TID) | ORAL | Status: AC
Start: 2012-12-16 — End: 2012-12-23

## 2012-12-16 MED ORDER — METRONIDAZOLE 500 MG PO TABS *I*
500.0000 mg | ORAL_TABLET | Freq: Three times a day (TID) | ORAL | Status: AC
Start: 2012-12-05 — End: 2012-12-19

## 2012-12-16 NOTE — Discharge Instructions (Addendum)
Discharge Instructions    Date of admission: 12/03/2012  Date of Discharge: 12/16/2012    Attending Surgeon: Annye Rusk, MD  Admission Diagnosis: Gangrene of left foot  Procedure: left leg guillotine amputation on 12/03/2012, left above the knee amputation on 12/09/2012    Follow up: Please follow up in two weeks with Dr. Annye Rusk for follow up and staple removal  Discharge medications:  Please refer to discharge medication sheet  You may take Tylenol for pain.  Do not exceed 3000mg /24 hours  Please complete full course of antibiotics    Diet: Resume diet as tolerated  Activity: As tolerated.  No heavy lifting    Incision/Dressing/Wound Care:   Dressing changes every other day starting 11/1. Left surgical site: Xeroform to incision site, ABDs, wrap w/ guaze and ACE bandage.  Next dressing change due 11/5    Please call [facility] [phone] or Surgeons office for:  Fever greater then 101 degrees Farenheit   Inability to urinate  Heavy bleeding   Trouble breathing, shortness of breath, chest pain

## 2012-12-16 NOTE — Progress Notes (Signed)
Vascular Surgery Progress Note    Patient: Michelle [name]    LOS: 13 days    Attending: Vincent Gros, MD     INTERVAL HISTORY & SUBJECTIVE  No acute events overnight. No complaints this AM. Tolerating PO. Denies CP, SOB, N/V/D. Denies fevers or chills. Understands she is going back to her SNF today.       OBJECTIVE  Physical Exam:  Temp:  [36.2 C (97.1 F)-37 C (98.6 F)] 37 C (98.6 F)  Heart Rate:  [68-73] 71  Resp:  [16] 16  BP: (114-130)/(60-70) 120/60 mmHg   GEN: NAD  CV: RRR  CHEST: CTA B   ABD: soft, nondistended, nontender  NEURO/MOTOR: alert, pleasantly confused  EXTREMITIES: L stump dressing C/D/I.   Intake/Output:  11/03 0700 - 11/04 0659  In: 720 [P.O.:720]  Out: 0      Medications:  Current Facility-Administered Medications   Medication   . potassium & sodium phosphates (PHOS-NAK) 280-160-250 MG packet 1 packet   . amoxicillin-clavulanate (AUGMENTIN) 500-125 MG per tablet 1 tablet   . sodium chloride 0.9 % flush 10 mL   . sodium chloride 0.9 % flush 10 mL   . sodium chloride 0.9 % flush 10 mL   . sodium chloride 0.9 % flush 10 mL   . metroNIDAZOLE (FLAGYL) tablet 500 mg   . atorvastatin (LIPITOR) tablet 10 mg   . carbidopa-levodopa (SINEMET) 25-100 MG per tablet 1 tablet   . famotidine (PEPCID) tablet 20 mg   . sertraline (ZOLOFT) tablet 50 mg   . acetaminophen (TYLENOL) tablet 1,000 mg   . carvedilol (COREG) tablet 3.125 mg   . ipratropium-albuterol (DUONEB) 0.5-2.5 (3) MG/3ML nebulization solution 3 mL   . heparin (porcine) SQ injection 5,000 Units   . ondansetron (ZOFRAN) injection 4 mg         Laboratory values:   Recent Labs      12/16/12   0351  12/15/12   0034   WBC  13.0*  14.2*   Hemoglobin  8.0*  8.1*   Hematocrit  25*  25*   Platelets  311  314    Recent Labs      12/16/12   0351  12/15/12   0634  12/15/12   0034   Sodium  136   --   138   Potassium  4.5   --   4.4   Chloride  103   --   106   CO2  26   --   25   UN  9   --   9   Creatinine  0.99*   --   0.93   Glucose  89   --   85      Calcium  7.7*   --   7.3*   Magnesium  1.5  1.6   --    Phosphorus  3.2  2.9   --     No results found for this basename: AST, ALT, ALK, TB, DB,  in the last 72 hoursNo results found for this basename: TP, ALB, PALB,  in the last 72 hours  No results found for this basename: AMY, LIP,  in the last 72 hours   GLUCOSE: No results found for this basename: PGLU,  in the last 72 hours  Imaging: No results found.     ASSESSMENT  Michelle [name] is a 67 y.o. 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     PLAN   Analgesia: Tylenol ATC, oxy IR prn, dilaudid IV for BT   Cont Flagyl for C diff and Augmentin for soft tissue coverage    Plan for dressing change today (Xeroform, kerlix and ACE wrape)    Regular diet   PT    Home meds   DVT ppx -- SQ heparin, SCDs    Dispo: SNF likely today   Please page surgery division 1 pager or surgery moonlighter pager for any issues or questions      Author: Sherian Rein, MD as of: 12/16/2012  at: 6:07 AM

## 2012-12-16 NOTE — Progress Notes (Signed)
SW confirmed with medical team that pt is medically stable and safe for discharge back to long term care facility today. SW contacted pt and family to inform of discharge time 12:00 going to [facility] via RMT stretcher.  Pt and family in agreement. SW requested updated PRI at this time.  SW informed the placement office of the time and method of transport. SW informed RN, Media planner, Press photographer, and pt at this time. SW will continue to be involved for any further discharge needs.      Nurse report number is [phone] east 1.  [name] LMSW  8:47 AM  12/16/2012

## 2012-12-16 NOTE — Consults (Signed)
WOCN Wound Progress Note      Assessment:Changed left AKA drsg- cleaned with n/s, painted area with Cavilon and covered staple line with Xeroform gauze, ABD pad and wrapped with ACE Wrap. Staple line is clean and intact without drainage.      Plan / Recommendations:  Continue with drsg per surgery team.      Dionicio Stall, RN 12/16/2012 5:15 PM

## 2012-12-16 NOTE — Discharge Summary (Signed)
Name: Asayo Buckalew MRN: [id number] DOB: Jul 03, 1945     Admit Date: 12/03/2012   Date of Discharge: 12/16/2012    Discharge Attending Physician: Vincent Gros      Hospitalization Summary    CONCISE NARRATIVE: Mrs. Bugh presented to [facility] ED with left foot wet gangrene with necrosis and sepsis. She was taken urgently to the OR for a left leg amputation. IV vancomycin and zosyn were also started. She remained intubated after the procedure. Cultures grew Pseudomonas. The amputation was revised to a AKA on 10/28. Her post-operative period was complicated by C.diff positive stool and a UTI. Flagyl and Augmentin were prescribed. She has had dressing changes every other day. PT cleared her for discharge to her long term care facility. She was eligible for discharge on 12/16/2012 and discharged to SNF in stable and satisfactory condition.      OR PROCEDURE: 12/03/2012- left leg guillotine amputation  12/09/2012- left above the knee amputation  SIGNIFICANT MED CHANGES: Yes  Augmentin- 500-125 mg tab daily      Signed: Jefm Bryant, PA  On: 12/16/2012  at: 8:45 AM

## 2012-12-16 NOTE — Progress Notes (Signed)
Pt understood all discharge instructions. Pt left for [facility] via RMT strecher at 1200. No change from initial assessment. Livia Snellen, RN

## 2012-12-17 ENCOUNTER — Ambulatory Visit
Admit: 2012-12-17 | Discharge: 2012-12-17 | Disposition: A | Discharge status: Home / Self Care | Payer: Self-pay | Source: Ambulatory Visit | Attending: Geriatric Medicine | Admitting: Geriatric Medicine

## 2012-12-17 LAB — CBC
Hematocrit: 25 % — ABNORMAL LOW (ref 34–45)
Hemoglobin: 8.2 g/dL — ABNORMAL LOW (ref 11.2–15.7)
MCV: 90 fL (ref 79–95)
Platelets: 334 10*3/uL (ref 160–370)
RBC: 2.7 MIL/uL — ABNORMAL LOW (ref 3.9–5.2)
RDW: 20.4 % — ABNORMAL HIGH (ref 11.7–14.4)
WBC: 11.8 10*3/uL — ABNORMAL HIGH (ref 4.0–10.0)

## 2012-12-17 LAB — BASIC METABOLIC PANEL
Anion Gap: 6 — ABNORMAL LOW (ref 7–16)
CO2: 28 mmol/L (ref 20–28)
Calcium: 7.4 mg/dL — ABNORMAL LOW (ref 8.6–10.2)
Chloride: 101 mmol/L (ref 96–108)
Creatinine: 1.06 mg/dL — ABNORMAL HIGH (ref 0.51–0.95)
GFR,Black: 62 *
GFR,Caucasian: 54 * — AB
Lab: 9 mg/dL (ref 6–20)
Potassium: 4.6 mmol/L (ref 3.3–5.1)
Sodium: 135 mmol/L (ref 133–145)

## 2012-12-17 LAB — GLUCOSE: Glucose: 80 mg/dL (ref 74–106)

## 2012-12-19 ENCOUNTER — Ambulatory Visit
Admit: 2012-12-19 | Discharge: 2012-12-19 | Disposition: A | Discharge status: Home / Self Care | Payer: Self-pay | Source: Ambulatory Visit | Attending: Geriatric Medicine | Admitting: Geriatric Medicine

## 2012-12-19 LAB — BASIC METABOLIC PANEL
Anion Gap: 8 (ref 7–16)
CO2: 28 mmol/L (ref 20–28)
Calcium: 8.5 mg/dL — ABNORMAL LOW (ref 8.6–10.2)
Chloride: 102 mmol/L (ref 96–108)
Creatinine: 1.11 mg/dL — ABNORMAL HIGH (ref 0.51–0.95)
GFR,Black: 59 * — AB
GFR,Caucasian: 51 * — AB
Lab: 11 mg/dL (ref 6–20)
Potassium: 4.6 mmol/L (ref 3.3–5.1)
Sodium: 138 mmol/L (ref 133–145)

## 2012-12-19 LAB — CBC
Hematocrit: 28 % — ABNORMAL LOW (ref 34–45)
Hemoglobin: 8.9 g/dL — ABNORMAL LOW (ref 11.2–15.7)
MCV: 94 fL (ref 79–95)
Platelets: 384 10*3/uL — ABNORMAL HIGH (ref 160–370)
RBC: 3 MIL/uL — ABNORMAL LOW (ref 3.9–5.2)
RDW: 21.1 % — ABNORMAL HIGH (ref 11.7–14.4)
WBC: 11.7 10*3/uL — ABNORMAL HIGH (ref 4.0–10.0)

## 2012-12-19 LAB — GLUCOSE: Glucose: 92 mg/dL (ref 74–106)

## 2012-12-22 ENCOUNTER — Ambulatory Visit
Admit: 2012-12-22 | Discharge: 2012-12-22 | Disposition: A | Discharge status: Home / Self Care | Payer: Self-pay | Source: Ambulatory Visit | Attending: Geriatric Medicine | Admitting: Geriatric Medicine

## 2012-12-22 LAB — CBC AND DIFFERENTIAL
Baso # K/uL: 0 10*3/uL (ref 0.0–0.1)
Basophil %: 0.3 % (ref 0.1–1.2)
Eos # K/uL: 0.3 10*3/uL (ref 0.0–0.4)
Eosinophil %: 2.7 % (ref 0.7–5.8)
Hematocrit: 28 % — ABNORMAL LOW (ref 34–45)
Hemoglobin: 8.6 g/dL — ABNORMAL LOW (ref 11.2–15.7)
Lymph # K/uL: 2.8 10*3/uL (ref 1.2–3.7)
Lymphocyte %: 27.2 % (ref 19.3–51.7)
MCV: 97 fL — ABNORMAL HIGH (ref 79–95)
Mono # K/uL: 1.3 10*3/uL — ABNORMAL HIGH (ref 0.2–0.9)
Monocyte %: 12.2 % (ref 4.7–12.5)
Neut # K/uL: 5.9 10*3/uL (ref 1.6–6.1)
Platelets: 364 10*3/uL (ref 160–370)
RBC: 2.8 MIL/uL — ABNORMAL LOW (ref 3.9–5.2)
RDW: 21.2 % — ABNORMAL HIGH (ref 11.7–14.4)
Seg Neut %: 57.6 % (ref 34.0–71.1)
WBC: 10.3 10*3/uL — ABNORMAL HIGH (ref 4.0–10.0)

## 2012-12-22 LAB — GLUCOSE: Glucose: 96 mg/dL (ref 74–106)

## 2012-12-22 LAB — BASIC METABOLIC PANEL
Anion Gap: 6 — ABNORMAL LOW (ref 7–16)
CO2: 29 mmol/L — ABNORMAL HIGH (ref 20–28)
Calcium: 8.4 mg/dL — ABNORMAL LOW (ref 8.6–10.2)
Chloride: 105 mmol/L (ref 96–108)
Creatinine: 1.1 mg/dL — ABNORMAL HIGH (ref 0.51–0.95)
GFR,Black: 60 *
GFR,Caucasian: 52 * — AB
Lab: 13 mg/dL (ref 6–20)
Potassium: 4.6 mmol/L (ref 3.3–5.1)
Sodium: 140 mmol/L (ref 133–145)

## 2012-12-24 ENCOUNTER — Encounter: Payer: Self-pay | Admitting: Geriatric Medicine

## 2012-12-24 ENCOUNTER — Ambulatory Visit: Payer: Self-pay | Admitting: Vascular Surgery

## 2012-12-24 ENCOUNTER — Non-Acute Institutional Stay: Payer: Self-pay | Admitting: Geriatric Medicine

## 2012-12-24 VITALS — BP 112/76

## 2012-12-24 DIAGNOSIS — I739 Peripheral vascular disease, unspecified: Secondary | ICD-10-CM | POA: Insufficient documentation

## 2012-12-24 NOTE — Progress Notes (Signed)
Michelle [name] presented today for a wound check of her L AKA.     It looks great, is C/D/I with no ischemia or drainage.     I will see her back in 2 weeks for staple removal.

## 2012-12-24 NOTE — Progress Notes (Signed)
Geriatrics  [facility] Chronic Note    Patient Name: Michelle [name]   Patient DOB: [birth year]   Patient MR#: [id number]   Facility: [facility]   Unit:        Reason for Visit  Michelle [name] was seen today for follow-up of chronic conditions.    Interval History:  The patient says she is feeling fairly well at this time.  She had an open area on her left foot which became infected and did not heal and she required hospitalization and a left above knee amputation.  She does get occasional phantom pain in the left leg but she says she is pretty comfortable today.  She is participating in physical and occupational therapy for transfers and for wheelchair mobility.  She denies any chest pain or palpitations.  She denies lightheadednes.     Past Medical History:    Medical History:  Past Medical History   Diagnosis Date   . CVA (cerebral infarction) 10/23/2011   . ASHD (arteriosclerotic heart disease) 10/23/2011   . MI (mitral incompetence) 10/23/2011   . CHF (congestive heart failure) 10/23/2011     EF 30%   . COPD (chronic obstructive pulmonary disease) 10/23/2011   . VT (ventricular tachycardia) 10/23/2011     S/p AICD   . Anemia of chronic disease 10/23/2011   . HTN (hypertension) 10/23/2011   . Parkinsonism    . Coronary artery disease    . AICD (automatic cardioverter/defibrillator) present    . Myocardial infarction june 2013   . Alcohol abuse, in remission    . Peripheral vascular disease      Left AKA       Surgical History  Past Surgical History   Procedure Laterality Date   . Icd     . Hemicolectomy Left    . Cardiac defibrillator placement     . Pacemaker insertion     . Hh picc placement consult hh only  12/10/2012              Review of Systems: Gastrointestinal she denies any constipation or diarrhea or heartburn or difficulty swallowing food.  She also denies nausea vomiting or any abdominal pain. Skin she denies any itching or rash or pain or open area.  The wound on her left leg appears to be healing well.  She still has  staples in place.  Musculoskeletal: She says her pain is under pretty good control at this time she has had chronic back pain but it is not bothering her much at this time.        Physical Examination:  Filed Vitals:    12/24/12 1437   BP: 126/60   Pulse: 74   Resp: 16       Physical Exam On physical exam, the patient is sitting up in no acute distress.  Patient is alert and oriented x3.  Patient moves all extremities normally except for a left foot drop.  There is no focal weakness no tremor and no rigidity.  The head is atraumatic normocephalic.  There is no tenderness over the face sinuses or scalp.  The eyes show no scleral icterus, no conjunctival injection, no drainage from either eye.  The mouth reveals moist mucosa, the pharynx is noninjected and there are no exudates.  Exam of the neck reveals no JVD, no neck masses, no thyromegaly and the trachea is midline.  Exam of the chest reveals no chest wall tenderness there is an AICD palpable in the left anterior chest  wall, lungs are clear. Cardiac rhythm is regular at 74 per minute.  There is no murmur gallop or rub.  S1 and S2 are normal. Abdominal exam reveals no tenderness, no guarding, no mass, no organomegaly.  Bowel sounds are normal, there is no CVA tenderness.  Extremities revealed no edema, she has a left AK a and the wound appears clean with no erythema no drainage staples are still in place, no calf tenderness.  There is no joint tenderness.  The skin reveals no open areas or rash.      Allergies  No Known Allergies (drug, envir, food or latex)     Medications:      Psychotropic Medications Zoloft 50 mg a day for anxiety and depression.  She is responding well to this and this will be continued.      Pain Management Tylenol 650 mg 4 times a day as needed.    Latest Laboratory Results  Lab Results   Component Value Date    NA 140 12/22/2012    K 4.6 12/22/2012    CL 105 12/22/2012    CO2 29* 12/22/2012    UN 13 12/22/2012    CREAT 1.10* 12/22/2012     VID25 30 01/07/2012    WBC 10.3* 12/22/2012    HGB 8.6* 12/22/2012    HCT 28* 12/22/2012    PLT 364 12/22/2012    TSH 0.62 09/22/2012    CHOL 150 04/23/2012    TRIG 161* 04/23/2012    HDL 40 04/23/2012    LDLC 78 04/23/2012    CHHDC 3.8 04/23/2012       Goals of Care: The goals of care are: Longevity    Advanced Directives:     No limitations on medical interventions      Assessment/Plan: 1.  Coronary artery disease with congestive heart failure.  She is  Not re.quiring a diuretic at this time  We will continue with amiodarone because of the arrhythmias in the past we will continue with aspirin 81 mg a day.  She remains on a low dose of carvedilol 3.125  mg twice a day and that will be continued.  She does have a history of hyperlipidemia and is on Lipitor because of her coronary artery disease and she has had borderline elevation of her liver enzymes which we are monitoring.  They have remained in the same range but they're still slightly elevated this may be related to her past history of alcohol use and the levels of elevation are not high enough that we need to discontinue the Lipitor at this point but we will need to continue monitoring this if her liver enzymes continue to rise we may need to consider taking her off the Lipitor.  I will be rechecking liver enzymes in the near future 2. peripheral vascular disease area she is status post left AKA and she is receiving physical and occupational therapy for transfers and wheelchair mobility and she is participating very well.  .  COPD.  This is well compensated at present she has when necessary guaifenesin ordered but does not require any inhalers at this time.  3.  Parkinsonism she is on Sinemet seems to be benefiting from that and tolerating it quite well we will continue with the Sinemet as ordered.      Follow-up: 2 months        Provider Signature:   Fernand Parkins, MD     Date: 12/24/2012 Time:   2:39 PM

## 2012-12-29 ENCOUNTER — Ambulatory Visit
Admit: 2012-12-29 | Discharge: 2012-12-29 | Disposition: A | Discharge status: Home / Self Care | Payer: Self-pay | Source: Ambulatory Visit | Attending: Geriatric Medicine | Admitting: Geriatric Medicine

## 2012-12-29 LAB — CBC
Hematocrit: 30 % — ABNORMAL LOW (ref 34–45)
Hemoglobin: 9.1 g/dL — ABNORMAL LOW (ref 11.2–15.7)
MCV: 99 fL — ABNORMAL HIGH (ref 79–95)
Platelets: 317 10*3/uL (ref 160–370)
RBC: 3 MIL/uL — ABNORMAL LOW (ref 3.9–5.2)
RDW: 20 % — ABNORMAL HIGH (ref 11.7–14.4)
WBC: 8.8 10*3/uL (ref 4.0–10.0)

## 2012-12-29 LAB — BASIC METABOLIC PANEL
Anion Gap: 10 (ref 7–16)
CO2: 26 mmol/L (ref 20–28)
Calcium: 8.4 mg/dL — ABNORMAL LOW (ref 8.6–10.2)
Chloride: 105 mmol/L (ref 96–108)
Creatinine: 0.95 mg/dL (ref 0.51–0.95)
GFR,Black: 71 *
GFR,Caucasian: 62 *
Lab: 13 mg/dL (ref 6–20)
Potassium: 4.3 mmol/L (ref 3.3–5.1)
Sodium: 141 mmol/L (ref 133–145)

## 2012-12-29 LAB — HEPATIC FUNCTION PANEL
ALT: <5 U/L (ref 0–35)
AST: 23 U/L (ref 0–35)
Albumin: 3 g/dL — ABNORMAL LOW (ref 3.5–5.2)
Alk Phos: 153 U/L — ABNORMAL HIGH (ref 35–105)
Bilirubin,Direct: <0.2 mg/dL (ref 0.0–0.3)
Bilirubin,Total: 0.4 mg/dL (ref 0.0–1.2)
Total Protein: 6.5 g/dL (ref 6.3–7.7)

## 2012-12-29 LAB — LIPID PANEL
Chol/HDL Ratio: 3.1
Cholesterol: 114 mg/dL
HDL: 37 mg/dL
LDL Calculated: 44 mg/dL
Non HDL Cholesterol: 77 mg/dL
Triglycerides: 167 mg/dL — AB

## 2012-12-29 LAB — GLUCOSE: Glucose: 88 mg/dL (ref 74–106)

## 2012-12-31 ENCOUNTER — Ambulatory Visit
Admit: 2012-12-31 | Discharge: 2012-12-31 | Disposition: A | Discharge status: Home / Self Care | Payer: Self-pay | Source: Ambulatory Visit | Attending: Geriatric Medicine | Admitting: Geriatric Medicine

## 2012-12-31 LAB — BASIC METABOLIC PANEL
Anion Gap: 13 (ref 7–16)
CO2: 24 mmol/L (ref 20–28)
Calcium: 8.7 mg/dL (ref 8.6–10.2)
Chloride: 104 mmol/L (ref 96–108)
Creatinine: 1.1 mg/dL — ABNORMAL HIGH (ref 0.51–0.95)
GFR,Black: 60 *
GFR,Caucasian: 52 * — AB
Glucose: 126 mg/dL — ABNORMAL HIGH (ref 60–99)
Lab: 18 mg/dL (ref 6–20)
Potassium: 4.3 mmol/L (ref 3.3–5.1)
Sodium: 141 mmol/L (ref 133–145)

## 2012-12-31 LAB — BANDS: Bands %: 1 % (ref 0–10)

## 2012-12-31 LAB — MANUAL DIFFERENTIAL

## 2012-12-31 LAB — CBC AND DIFFERENTIAL
Baso # K/uL: 0.1 10*3/uL (ref 0.0–0.1)
Basophil %: 0.9 % (ref 0.1–1.2)
Eos # K/uL: 0.4 10*3/uL (ref 0.0–0.4)
Eosinophil %: 3.4 % (ref 0.7–5.8)
Hematocrit: 30 % — ABNORMAL LOW (ref 34–45)
Hemoglobin: 9.1 g/dL — ABNORMAL LOW (ref 11.2–15.7)
Lymph # K/uL: 3.2 10*3/uL (ref 1.2–3.7)
Lymphocyte %: 25.9 % (ref 19.3–51.7)
MCV: 100 fL — ABNORMAL HIGH (ref 79–95)
Mono # K/uL: 0.7 10*3/uL (ref 0.2–0.9)
Monocyte %: 6 % (ref 4.7–12.5)
Neut # K/uL: 7.8 10*3/uL — ABNORMAL HIGH (ref 1.6–6.1)
Nucl RBC # K/uL: 0.1 10*3/uL
Nucl RBC %: 0.9 /100 WBC — ABNORMAL HIGH (ref 0.0–0.2)
Platelets: 307 10*3/uL (ref 160–370)
RBC: 3 MIL/uL — ABNORMAL LOW (ref 3.9–5.2)
RDW: 19.6 % — ABNORMAL HIGH (ref 11.7–14.4)
Seg Neut %: 62.9 % (ref 34.0–71.1)
WBC: 12.2 10*3/uL — ABNORMAL HIGH (ref 4.0–10.0)

## 2012-12-31 LAB — DIFF BASED ON: Diff Based On: 116 CELLS

## 2012-12-31 LAB — MISC. CELL %: Misc. Cell %: 0 % (ref 0–0)

## 2012-12-31 LAB — GIANT PLATELETS

## 2013-01-01 ENCOUNTER — Ambulatory Visit
Admit: 2013-01-01 | Discharge: 2013-01-01 | Disposition: A | Discharge status: Home / Self Care | Payer: Self-pay | Source: Ambulatory Visit | Attending: Geriatric Medicine | Admitting: Geriatric Medicine

## 2013-01-01 LAB — CBC AND DIFFERENTIAL
Baso # K/uL: 0 10*3/uL (ref 0.0–0.1)
Basophil %: 0 % (ref 0.1–1.2)
Eos # K/uL: 0.7 10*3/uL — ABNORMAL HIGH (ref 0.0–0.4)
Eosinophil %: 5.3 % (ref 0.7–5.8)
Hematocrit: 29 % — ABNORMAL LOW (ref 34–45)
Hemoglobin: 9 g/dL — ABNORMAL LOW (ref 11.2–15.7)
Lymph # K/uL: 2.3 10*3/uL (ref 1.2–3.7)
Lymphocyte %: 18.4 % — ABNORMAL LOW (ref 19.3–51.7)
MCV: 100 fL — ABNORMAL HIGH (ref 79–95)
Mono # K/uL: 1.2 10*3/uL — ABNORMAL HIGH (ref 0.2–0.9)
Monocyte %: 9.6 % (ref 4.7–12.5)
Neut # K/uL: 8.3 10*3/uL — ABNORMAL HIGH (ref 1.6–6.1)
Nucl RBC # K/uL: 0.1 10*3/uL
Nucl RBC %: 0.9 /100 WBC — ABNORMAL HIGH (ref 0.0–0.2)
Platelets: 292 10*3/uL (ref 160–370)
RBC: 2.9 MIL/uL — ABNORMAL LOW (ref 3.9–5.2)
RDW: 19.5 % — ABNORMAL HIGH (ref 11.7–14.4)
Seg Neut %: 65.8 % (ref 34.0–71.1)
WBC: 12.6 10*3/uL — ABNORMAL HIGH (ref 4.0–10.0)

## 2013-01-01 LAB — DIFF BASED ON: Diff Based On: 114 CELLS

## 2013-01-01 LAB — GIANT PLATELETS

## 2013-01-01 LAB — MYELOCYTE: Myelocyte %: 1 % — ABNORMAL HIGH (ref 0–0)

## 2013-01-01 LAB — MISC. CELL %: Misc. Cell %: 0 % (ref 0–0)

## 2013-01-01 LAB — MANUAL DIFFERENTIAL

## 2013-01-02 ENCOUNTER — Ambulatory Visit
Admit: 2013-01-02 | Discharge: 2013-01-02 | Disposition: A | Discharge status: Home / Self Care | Payer: Self-pay | Source: Ambulatory Visit | Attending: Geriatric Medicine | Admitting: Geriatric Medicine

## 2013-01-02 LAB — URINE MICROSCOPIC (IQ200)
Hyaline Casts,UA: 15 /lpf — ABNORMAL HIGH (ref 0–2)
RBC,UA: 21 /hpf — ABNORMAL HIGH (ref 0–2)
WBC,UA: 79 /hpf — ABNORMAL HIGH (ref 0–5)

## 2013-01-02 LAB — URINALYSIS WITH REFLEX TO MICROSCOPIC
Blood,UA: NEGATIVE
Ketones, UA: NEGATIVE
Nitrite,UA: NEGATIVE
Protein,UA: NEGATIVE mg/dL
Specific Gravity,UA: 1.023 (ref 1.002–1.030)
pH,UA: 5 (ref 5.0–8.0)

## 2013-01-02 LAB — CBC AND DIFFERENTIAL
Baso # K/uL: 0.2 10*3/uL — ABNORMAL HIGH (ref 0.0–0.1)
Basophil %: 1.8 % — ABNORMAL HIGH (ref 0.1–1.2)
Eos # K/uL: 0.1 10*3/uL (ref 0.0–0.4)
Eosinophil %: 0.9 % (ref 0.7–5.8)
Hematocrit: 31 % — ABNORMAL LOW (ref 34–45)
Hemoglobin: 9.3 g/dL — ABNORMAL LOW (ref 11.2–15.7)
Lymph # K/uL: 3.6 10*3/uL (ref 1.2–3.7)
Lymphocyte %: 30.7 % (ref 19.3–51.7)
MCV: 101 fL — ABNORMAL HIGH (ref 79–95)
Mono # K/uL: 0.6 10*3/uL (ref 0.2–0.9)
Monocyte %: 5.3 % (ref 4.7–12.5)
Neut # K/uL: 7.1 10*3/uL — ABNORMAL HIGH (ref 1.6–6.1)
Nucl RBC # K/uL: 0 10*3/uL
Nucl RBC %: 0 /100 WBC (ref 0.0–0.2)
Platelets: 276 10*3/uL (ref 160–370)
RBC: 3 MIL/uL — ABNORMAL LOW (ref 3.9–5.2)
RDW: 19.4 % — ABNORMAL HIGH (ref 11.7–14.4)
Seg Neut %: 61.4 % (ref 34.0–71.1)
WBC: 11.6 10*3/uL — ABNORMAL HIGH (ref 4.0–10.0)

## 2013-01-02 LAB — BASIC METABOLIC PANEL
Anion Gap: 10 (ref 7–16)
CO2: 26 mmol/L (ref 20–28)
Calcium: 8.4 mg/dL — ABNORMAL LOW (ref 8.6–10.2)
Chloride: 106 mmol/L (ref 96–108)
Creatinine: 1.12 mg/dL — ABNORMAL HIGH (ref 0.51–0.95)
GFR,Black: 58 * — AB
GFR,Caucasian: 51 * — AB
Lab: 21 mg/dL — ABNORMAL HIGH (ref 6–20)
Potassium: 4.5 mmol/L (ref 3.3–5.1)
Sodium: 142 mmol/L (ref 133–145)

## 2013-01-02 LAB — MISC. CELL %: Misc. Cell %: 0 % (ref 0–0)

## 2013-01-02 LAB — GLUCOSE: Glucose: 100 mg/dL (ref 74–106)

## 2013-01-02 LAB — MANUAL DIFFERENTIAL

## 2013-01-02 LAB — DIFF BASED ON: Diff Based On: 114 CELLS

## 2013-01-03 LAB — AEROBIC CULTURE

## 2013-01-05 ENCOUNTER — Ambulatory Visit
Admit: 2013-01-05 | Discharge: 2013-01-05 | Disposition: A | Discharge status: Home / Self Care | Payer: Self-pay | Source: Ambulatory Visit | Attending: Geriatric Medicine | Admitting: Geriatric Medicine

## 2013-01-05 LAB — GLUCOSE: Glucose: 102 mg/dL (ref 74–106)

## 2013-01-05 LAB — BASIC METABOLIC PANEL
Anion Gap: 15 (ref 7–16)
CO2: 24 mmol/L (ref 20–28)
Calcium: 9.2 mg/dL (ref 8.6–10.2)
Chloride: 99 mmol/L (ref 96–108)
Creatinine: 1.54 mg/dL — ABNORMAL HIGH (ref 0.51–0.95)
GFR,Black: 40 * — AB
GFR,Caucasian: 34 * — AB
Lab: 27 mg/dL — ABNORMAL HIGH (ref 6–20)
Potassium: 5.5 mmol/L — ABNORMAL HIGH (ref 3.3–5.1)
Sodium: 138 mmol/L (ref 133–145)

## 2013-01-05 LAB — CBC
Hematocrit: 34 % (ref 34–45)
Hemoglobin: 10.6 g/dL — ABNORMAL LOW (ref 11.2–15.7)
MCV: 99 fL — ABNORMAL HIGH (ref 79–95)
Platelets: 335 10*3/uL (ref 160–370)
RBC: 3.4 MIL/uL — ABNORMAL LOW (ref 3.9–5.2)
RDW: 18.5 % — ABNORMAL HIGH (ref 11.7–14.4)
WBC: 12.6 10*3/uL — ABNORMAL HIGH (ref 4.0–10.0)

## 2013-01-06 ENCOUNTER — Encounter: Payer: Self-pay | Admitting: Emergency Medicine

## 2013-01-06 ENCOUNTER — Other Ambulatory Visit: Payer: Self-pay | Admitting: Gastroenterology

## 2013-01-06 ENCOUNTER — Ambulatory Visit
Admit: 2013-01-06 | Discharge: 2013-01-06 | Disposition: A | Discharge status: Home / Self Care | Payer: Self-pay | Source: Ambulatory Visit | Attending: Geriatric Medicine | Admitting: Geriatric Medicine

## 2013-01-06 ENCOUNTER — Inpatient Hospital Stay
Admit: 2013-01-06 | Disposition: A | Discharge status: Skilled Nursing Facility | Payer: Self-pay | Source: Skilled Nursing Facility | Attending: Surgery | Admitting: Surgery

## 2013-01-06 DIAGNOSIS — E860 Dehydration: Secondary | ICD-10-CM | POA: Diagnosis present

## 2013-01-06 DIAGNOSIS — I959 Hypotension, unspecified: Secondary | ICD-10-CM | POA: Diagnosis present

## 2013-01-06 DIAGNOSIS — K56609 Unspecified intestinal obstruction, unspecified as to partial versus complete obstruction: Principal | ICD-10-CM | POA: Diagnosis present

## 2013-01-06 DIAGNOSIS — N179 Acute kidney failure, unspecified: Secondary | ICD-10-CM | POA: Diagnosis present

## 2013-01-06 LAB — BASIC METABOLIC PANEL
Anion Gap: 22 — ABNORMAL HIGH (ref 7–16)
Anion Gap: 17 — ABNORMAL HIGH (ref 7–16)
CO2: 17 mmol/L — ABNORMAL LOW (ref 20–28)
CO2: 17 mmol/L — ABNORMAL LOW (ref 20–28)
Calcium: 9.2 mg/dL (ref 8.6–10.2)
Calcium: 9.3 mg/dL (ref 8.6–10.2)
Chloride: 103 mmol/L (ref 96–108)
Chloride: 104 mmol/L (ref 96–108)
Creatinine: 3.01 mg/dL — ABNORMAL HIGH (ref 0.51–0.95)
Creatinine: 3.34 mg/dL — ABNORMAL HIGH (ref 0.51–0.95)
GFR,Black: 18 * — AB
GFR,Black: 16 * — AB
GFR,Caucasian: 15 * — AB
GFR,Caucasian: 14 * — AB
Glucose: 127 mg/dL — ABNORMAL HIGH (ref 60–99)
Glucose: 123 mg/dL — ABNORMAL HIGH (ref 60–99)
Lab: 35 mg/dL — ABNORMAL HIGH (ref 6–20)
Lab: 35 mg/dL — ABNORMAL HIGH (ref 6–20)
Potassium: 5.9 mmol/L — ABNORMAL HIGH (ref 3.3–5.1)
Potassium: 5.3 mmol/L — ABNORMAL HIGH (ref 3.3–5.1)
Sodium: 142 mmol/L (ref 133–145)
Sodium: 138 mmol/L (ref 133–145)

## 2013-01-06 LAB — RUQ PANEL (ED ONLY)
ALT: 6 U/L (ref 0–35)
AST: 28 U/L (ref 0–35)
Albumin: 3.9 g/dL (ref 3.5–5.2)
Alk Phos: 172 U/L — ABNORMAL HIGH (ref 35–105)
Amylase: 130 U/L — ABNORMAL HIGH (ref 28–100)
Bilirubin,Direct: <0.2 mg/dL (ref 0.0–0.3)
Bilirubin,Total: 0.4 mg/dL (ref 0.0–1.2)
Globulin: 4.1 g/dL (ref 2.7–4.3)
Lipase: 94 U/L — ABNORMAL HIGH (ref 13–60)
Total Protein: 8 g/dL — ABNORMAL HIGH (ref 6.3–7.7)

## 2013-01-06 LAB — URINALYSIS REFLEX TO CULTURE
Blood,UA: NEGATIVE
Nitrite,UA: NEGATIVE
Protein,UA: 25 mg/dL — AB
Specific Gravity,UA: 1.022 (ref 1.001–1.030)
pH,UA: 5 (ref 5.0–8.0)

## 2013-01-06 LAB — PROTIME-INR
INR: 1.1 (ref 1.0–1.2)
Protime: 11.5 s (ref 9.2–12.3)

## 2013-01-06 LAB — CBC AND DIFFERENTIAL
Baso # K/uL: 0 10*3/uL (ref 0.0–0.1)
Basophil %: 0.1 % (ref 0.1–1.2)
Eos # K/uL: 0 10*3/uL (ref 0.0–0.4)
Eosinophil %: 0.1 % — ABNORMAL LOW (ref 0.7–5.8)
Hematocrit: 35 % (ref 34–45)
Hemoglobin: 11 g/dL — ABNORMAL LOW (ref 11.2–15.7)
Lymph # K/uL: 2.1 10*3/uL (ref 1.2–3.7)
Lymphocyte %: 13.8 % — ABNORMAL LOW (ref 19.3–51.7)
MCV: 98 fL — ABNORMAL HIGH (ref 79–95)
Mono # K/uL: 1.3 10*3/uL — ABNORMAL HIGH (ref 0.2–0.9)
Monocyte %: 8.2 % (ref 4.7–12.5)
Neut # K/uL: 12 10*3/uL — ABNORMAL HIGH (ref 1.6–6.1)
Platelets: 317 10*3/uL (ref 160–370)
RBC: 3.6 MIL/uL — ABNORMAL LOW (ref 3.9–5.2)
RDW: 18.3 % — ABNORMAL HIGH (ref 11.7–14.4)
Seg Neut %: 77 % — ABNORMAL HIGH (ref 34.0–71.1)
WBC: 15.5 10*3/uL — ABNORMAL HIGH (ref 4.0–10.0)

## 2013-01-06 LAB — TROPONIN T: Troponin T: 0.03 ng/mL — ABNORMAL HIGH (ref 0.00–0.02)

## 2013-01-06 LAB — CBC
Hematocrit: 38 % (ref 34–45)
Hemoglobin: 12.1 g/dL (ref 11.2–15.7)
MCV: 99 fL — ABNORMAL HIGH (ref 79–95)
Platelets: 358 10*3/uL (ref 160–370)
RBC: 3.9 MIL/uL (ref 3.9–5.2)
RDW: 18.4 % — ABNORMAL HIGH (ref 11.7–14.4)
WBC: 19.1 10*3/uL — ABNORMAL HIGH (ref 4.0–10.0)

## 2013-01-06 LAB — URINE MICROSCOPIC (IQ200)

## 2013-01-06 LAB — APTT: aPTT: 28.5 s (ref 25.8–37.9)

## 2013-01-06 LAB — LACTATE, VENOUS, WHOLE BLOOD: Lactate VEN,WB: 2 mmol/L (ref 0.5–2.2)

## 2013-01-06 MED ORDER — SODIUM CHLORIDE 0.9 % IV SOLN WRAPPED *I*
1000.0000 mL | Freq: Once | Status: AC
Start: 2013-01-06 — End: 2013-01-06
  Administered 2013-01-06: 1000 mL via INTRAVENOUS

## 2013-01-06 MED ORDER — SODIUM CHLORIDE 0.9 % IV SOLN WRAPPED *I*
150.0000 mL/h | Status: DC
Start: 2013-01-06 — End: 2013-01-07
  Administered 2013-01-06 – 2013-01-07 (×4): 150 mL/h via INTRAVENOUS

## 2013-01-06 MED ORDER — HEPARIN SODIUM 5000 UNIT/ML SQ *I*
5000.0000 [IU] | Freq: Three times a day (TID) | SUBCUTANEOUS | Status: DC
Start: 2013-01-06 — End: 2013-01-16
  Administered 2013-01-06 – 2013-01-15 (×28): 5000 [IU] via SUBCUTANEOUS
  Filled 2013-01-06 (×28): qty 1

## 2013-01-06 MED ORDER — PIPERACILLIN-TAZOBACTAM IN D5W 2.25 GM/50ML IV SOLN *I*
2.2500 g | Freq: Three times a day (TID) | INTRAVENOUS | Status: DC
Start: 2013-01-07 — End: 2013-01-07
  Administered 2013-01-07 (×2): 2.25 g via INTRAVENOUS
  Filled 2013-01-06 (×5): qty 50

## 2013-01-06 MED ORDER — VANCOMYCIN HCL IN DEXTROSE 5 MG/ML IV SOLN *I*
1000.0000 mg | Freq: Once | INTRAVENOUS | Status: AC
Start: 2013-01-06 — End: 2013-01-06
  Administered 2013-01-06: 1000 mg via INTRAVENOUS
  Filled 2013-01-06: qty 200

## 2013-01-06 MED ORDER — PIPERACILLIN-TAZOBACTAM IN D5W 3.375 GM/50ML IV SOLN *I*
3.3750 g | Freq: Once | INTRAVENOUS | Status: AC
Start: 2013-01-06 — End: 2013-01-06
  Administered 2013-01-06: 3.375 g via INTRAVENOUS
  Filled 2013-01-06: qty 50

## 2013-01-06 MED ORDER — ASPIRIN 81 MG PO CHEW *I*
324.0000 mg | CHEWABLE_TABLET | Freq: Once | ORAL | Status: DC
Start: 2013-01-06 — End: 2013-01-06
  Filled 2013-01-06: qty 4

## 2013-01-06 MED ORDER — ALBUTEROL SULFATE (2.5 MG/3ML) 0.083% IN NEBU *I*
2.5000 mg | INHALATION_SOLUTION | RESPIRATORY_TRACT | Status: AC | PRN
Start: 2013-01-06 — End: 2013-01-13

## 2013-01-06 MED ORDER — PIPERACILLIN-TAZOBACTAM IN D5W 2.25 GM/50ML IV SOLN *I*
2.2500 g | Freq: Four times a day (QID) | INTRAVENOUS | Status: DC
Start: 2013-01-07 — End: 2013-01-06

## 2013-01-06 MED ORDER — SODIUM CHLORIDE 0.9 % IV BOLUS *I*
1000.0000 mL | Freq: Once | Status: AC
Start: 2013-01-06 — End: 2013-01-06
  Administered 2013-01-06: 1000 mL via INTRAVENOUS

## 2013-01-06 NOTE — ED Notes (Signed)
Patient continues to have decreased b/p.  ICU provider aware, 4th IVF bolus infusing.

## 2013-01-06 NOTE — ED Notes (Signed)
Spoke with Feliz Beam in ICU about patient's transfer to ICU, Feliz Beam will be taking over care of patient.

## 2013-01-06 NOTE — Progress Notes (Signed)
Pt arrived to ICU bed 6 from ED, accompanied by RN and PCT.  Arrives on 2 liters oxygen via NC, NS infusing at 999 ml/hr.  Transferred to ICU bed, placed on monitor.  CHG bath completed.  See VS and assessment flow-sheet for more details.    ICU Admission Skin Assessment:

## 2013-01-06 NOTE — Comprehensive Assessment (Signed)
01/06/13 0000   Discharge Planning   Lives With SNF  ([facility])   Can they assist with pt needs after discharge? Yes   *Does patient currently have home care services? No   *Current External Services None   Current Home Equipment Wheelchair-manual   Expected Discharge Date (TBD)   Follow for: Discharge Planning   SW Plan SW to follow (see discharge plan)   Information obtained from medical record. Pt resides at the [facility] for [facility] and has lived there since June of 2013. She uses a wheelchair with assist at baseline. SW to follow for return to SNF as needed.  Matilde Bash, LMSW  01/06/2013  3:15 PM

## 2013-01-06 NOTE — Provider Consult (Addendum)
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 Upon arrival to ED, patient was AVSS and denied having any abdominal pain. Last BM PTA was yesterday. Labs revealed: Cr 3.34 (0.9 - 1.10 at baseline), venous lactate 2.0, WBC 15.5, Trop 0.03. Cardiology was consulted given elevated troponins and ST elevations and ruled out acute coronary syndrome. CT abd/pelvis showed small bowel obstruction with no clear transition point and subcutaneous air in right thigh. Vascular surgery was consulted to r/o ischemia or necrotizing fasciitis.    Patient denies any BLE pain. No skin breakdown, erythema, tenderness or crepitus noted in right leg/hip/buttock. + stool in briefs that is brown and nonbloody. Upon further investigation, patient was getting clysis at SNF for a couple of days and infusion site was right thigh.       Past Medical History   Diagnosis Date   . CVA (cerebral infarction) 10/23/2011   . ASHD (arteriosclerotic heart disease) 10/23/2011   . MI (mitral incompetence) 10/23/2011   . CHF (congestive heart failure) 10/23/2011     EF 30%   . COPD (chronic obstructive pulmonary disease) 10/23/2011   . VT (ventricular tachycardia) 10/23/2011     S/p AICD   . Anemia of chronic disease 10/23/2011   . HTN (hypertension) 10/23/2011   . Parkinsonism    . Coronary artery disease    . AICD (automatic cardioverter/defibrillator) present    . Myocardial infarction june 2013   . Alcohol abuse, in remission    . Peripheral vascular disease      Left AKA     Past Surgical History   Procedure Laterality Date   . Icd     . Hemicolectomy Left    . Cardiac defibrillator placement     . Pacemaker insertion     . Hh picc placement consult hh only  12/10/2012            History reviewed. No pertinent family history.  History     Social History   . Marital Status: Widowed     Spouse Name: N/A     Number of Children: N/A   . Years of Education: N/A     Social History Main Topics   . Smoking status: Unknown If Ever Smoked   . Smokeless tobacco: None   . Alcohol Use: No      Comment: formerly, heavy drinker   . Drug Use: None   . Sexual Activity: None     Other Topics Concern   . None     Social History Narrative    ** Merged History Encounter **            Allergies: No Known Allergies (drug, envir, food or latex)    Prior to Admission Medications:    (Not in a hospital admission)    Active Hospital Medications:  Current Facility-Administered Medications   Medication Dose Route Frequency   . Vancomycin (VANCOCIN) IV 1,000 mg  1,000 mg Intravenous Once   . piperacillin-tazobactam (ZOSYN) IVPB 3.375 g  3.375 g Intravenous Once     Current Outpatient Prescriptions   Medication   . acetaminophen (TYLENOL) 500 mg tablet   . potassium chloride SA (  KLOR-CON M20) 20 mEq CR tablet   . carvedilol (COREG) 6.25 MG tablet   . cyanocobalamin (VITAMIN B-12) 1000 MCG tablet   . aspirin 81 MG EC tablet   . carbidopa-levodopa (SINEMET) 25-100 MG per tablet   . famotidine (PEPCID) 20 MG tablet   . Cholecalciferol (VITAMIN D3) 50000 UNITS CAPS   . amiodarone (PACERONE) 400 MG tablet   . sertraline (ZOLOFT) 50 MG tablet   . atorvastatin (LIPITOR) 10 MG tablet   . HYDROcodone-acetaminophen (NORCO) 5-325 MG per tablet   . guaiFENesin (ROBITUSSIN) 100 MG/5ML syrup   . sorbitol 70 % solution   . ipratropium-albuterol (DUONEB) 0.5-2.5 (3) MG/3ML nebulizer solution   . calcium carbonate-vitamin D 600-400 MG-UNIT per tablet   . bisacodyl (DULCOLAX) 10 MG suppository       Review of Systems:  Review of Systems   Constitutional: Negative for fever and chills.   Respiratory: Negative for shortness of breath.    Cardiovascular: Negative for chest pain.   Gastrointestinal: Positive for nausea and vomiting.  Negative for abdominal pain, constipation, blood in stool and melena.       Last Nursing documented pain:  0-10 Scale: 0 (01/06/13 1405)      Patient Vitals for the past 24 hrs:   BP Temp Temp src Pulse Resp SpO2 Height Weight   01/06/13 1743 94/47 mmHg - - 51 - 100 % - -   01/06/13 1552 91/58 mmHg - - 71 - 100 % - -   01/06/13 1405 82/62 mmHg 36.2 C (97.2 F) TEMPORAL - 16 94 % 1.6 m (5\' 3" ) 63.504 kg (140 lb)     O2 Device: None (Room air) (01/06/13 1405)     Physical Examination:  Physical Exam   Constitutional: She appears well-developed. No distress.   HENT:   Head: Normocephalic and atraumatic.   Eyes: EOM are normal. Pupils are equal, round, and reactive to light.   Neck: Normal range of motion. Neck supple.   Cardiovascular: Normal rate.    Pulmonary/Chest: Effort normal. No respiratory distress.   Abdominal: Soft. She exhibits distension (moderately distended). There is no tenderness. There is no rebound and no guarding.   Musculoskeletal:        Right hip: She exhibits no tenderness, no swelling, no crepitus, no deformity and no laceration.        Left upper leg: She exhibits no tenderness, no swelling, no edema, no deformity and no laceration.   Left AKA site well healed with staples intact, no breakdown, no drainage   Skin: Skin is warm and dry.         Lab Results:    Recent Labs  Lab 01/06/13  1515 01/06/13  0919 01/05/13  0635  12/31/12  1315   Sodium 138 142 138  < > 141   Potassium 5.3* 5.9* 5.5*  < > 4.3   CO2 17* 17* 24  < > 24   UN 35* 35* 27*  < > 18   Creatinine 3.34* 3.01* 1.54*  < > 1.10*   Glucose 123* 127*  --   --  126*   Calcium 9.3 9.2 9.2  < > 8.7   < > = values in this interval not displayed.    Recent Labs  Lab 01/06/13  1515 01/06/13  0919 01/05/13  0635   WBC 15.5* 19.1* 12.6*   Hemoglobin 11.0* 12.1 10.6*   Hematocrit 35 38 34   Platelets 317 358 335  Recent Labs  Lab 01/06/13  1515   INR 1.1     Trop 0.03    Radiology impressions (last 3 days):  Ct Abdomen And Pelvis  Without Contrast    01/06/2013   IMPRESSION:   1. Small bowel obstruction. The transition point is not clearly  visualized.   2. Bibasilar consolidation may represent atelectasis and/or pneumonia.   3. Cholelithiasis.   4. Nonspecific right anterior thigh subcutaneous emphysema. Recommend  clinical correlation for recent trauma or surgical procedure to  exclude the possibility of infection.   5. Bibasilar pulmonary consolidations may represent pneumonia and/or  atelectasis. Other entities cannot be definitely excluded. Recommend  further evaluation with chest CT if clinically warranted.   END OF REPORT    * Portable Chest Standard Ap Single View    01/06/2013   IMPRESSION:   Left lower lung field consolidation may represent atelectasis and/or  pneumonia. Recommend PA and lateral views for further evaluation.   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 Patient noted to have leukocytosis and CT showing bowel obstruction and right thigh subcutaneous emphysema. Right thigh/hip/buttock is clinically benign and no signs of necrotizing fasciitis. From review of chart and documentation from SNF, subcutaneous emphysema is likely secondary to patient receiving clysis in right thigh at SNF. Unclear etiology for leukocytosis, but unlikely that left AKA wound is the source, given that wound appears to be well healed.    Plan:   - No indication for immediate surgical intervention. No concerns for necrotizing fasciitis or cellulitis.  - If still concern for necrotizing fasciitis and/or ischemia, then can check XRay or CT scan RLE.   - Will defer management of sbo to general surgery and medicine  - Case discussed with Dr. Annye Rusk who is in agreement with the plan    I saw and evaluated the patient. I  agree with the resident's/fellow's findings and plan of care as documented above. Details of my evaluation are as follows:     As above. Asked to comment on LLE AKA site which looks great, and RLE sub Q emphysema which is most likely from clysis - no overlying skin changes or signs of infection. No urgent need for surgical therapy for BLE.     Vincent Gros, MD        Author: Christie Nottingham, MD  Note created: 01/06/2013  at: 6:21 PM

## 2013-01-06 NOTE — Progress Notes (Signed)
Writer called and spoke with [facility] staff to obtain phone number for patient's next of kin. Per staff patient son Drew's phone number is [phone]. This number is currently a working number. Staff states that often his phone is known to be off. No other family members listed for patient.    Sw will assist in contacting son Kenard Gower as able.    Ciro Backer, LMSW 01/06/2013 7:51 PM   [phone]

## 2013-01-06 NOTE — ED Notes (Signed)
Bed: PA-02  Expected date: 01/06/13  Expected time: 1:53 PM  Means of arrival:   Comments:  [place], [place] Jan 16, 2046  [facility] # Leonie Green NP # [phone] requesting call after evaluation   3 days of n/v. Flat plat shows ileus. Has been on clears with no improvement   Renal function continues to worsen, wbc elevated, k elevated  Pt is sp R aka. Site looks clean  cdiff infection in the hospital. Received 3 days of rocephin over the weekend for a presumed uti. Not having diarrhea at this time

## 2013-01-06 NOTE — Comprehensive Assessment (Signed)
01/06/13 1511   Demographics   Religious/Cultural Factors [facility] )   [place] of Residence [place]   Marital Status Widowed   Ethnicity/Race Caucasian   Risk Factors   Risk Factors Age related issues;Adjustment to Dx/Injury/Illness;Current or planned institutional Placement  (long-term care at [facility] )   Health Care Directives Screen (Also required for Hospice Patients)   *Has patient (or family) completed any of the following? (select all that apply) MOLST   MOLST available for inclusion in the chart? Yes   Does MOLST designate Other (comment)  (full CPR )   MOLST in chart? Yes   Contacts/Support Systems   Contacts/Support Systems Residential Staff   Agency [facility]    Number [phone]   Relationship long-term care staff   Comments pt has lived there since 08/02/11    Contact Information   Spokesperson Name Georgiann Cocker    Relationship to Patient  Son   Phone Number(s) [phone]    Alternate Spokesperson? No   Emergency Contact other than spokesperson? No   Ride to/from Designer, television/film set Situation   Lives With SNF   Comments [facility] long-term care    Home Geography   Type of Home Facility (SNF)   # Of Steps In Home 0   # Steps to Enter Home 0   One Story or Two One story   Bedroom First floor   Bathroom First floor   Utilitites Working Yes   Activities of Daily Living   Transfers Required assistance  (2 assist )   Teaching laboratory technician   Ambulation With assistance  (wc with assist)   Bathing/Grooming With assistance   Nutrition With assistance  (meals provided, pt feeds herself after setup. reg diet )   Household Management With assistance  (SNF )   Income Information   Vocational Retired   Health and safety inspector Other (comment);Medicaid  Market researcher )   Pharmacy Used through [facility] not a [employer] obtained from medical record.  Matilde Bash, LMSW  01/06/2013  3:13 PM

## 2013-01-06 NOTE — Progress Notes (Signed)
Utilization Management    Level of Care Inpatient as of the date 01/06/13      Gerasimos Plotts, RN     Pager: [phone]

## 2013-01-06 NOTE — ED Provider Notes (Addendum)
History     Chief Complaint   Patient presents with   . Abnormal Lab     HPI Comments: 67 yo woman s/p left AKA presents with nausea, vomiting.  She states that she is asymptomatic currently, and has no complaints at this time.  No abdominal pain.  No chest pain.  No SOB.  Last BM yesterday.  Per chart review, she has had nausea and vomiting for several days, and was hypotensive (96/64) on 11/23.  The patient refused hospitalization at that time, and clysis was initiated at her SNF.  She also completed a course of rocephin three days ago for UTI.  She reports no dysuria currently.  No fevers.  She had labs checked today and had AKI (creatinine 3 from 1), and K 5.9.  She also had a leukocytosis (WBC 19).  Reportedly abnormal x-ray.    Per chart review, she is full code.      History provided by:  Patient and medical records      Past Medical History   Diagnosis Date   . CVA (cerebral infarction) 10/23/2011   . ASHD (arteriosclerotic heart disease) 10/23/2011   . MI (mitral incompetence) 10/23/2011   . CHF (congestive heart failure) 10/23/2011     EF 30%   . COPD (chronic obstructive pulmonary disease) 10/23/2011   . VT (ventricular tachycardia) 10/23/2011     S/p AICD   . Anemia of chronic disease 10/23/2011   . HTN (hypertension) 10/23/2011   . Parkinsonism    . Coronary artery disease    . AICD (automatic cardioverter/defibrillator) present    . Myocardial infarction june 2013   . Alcohol abuse, in remission    . Peripheral vascular disease      Left AKA            Past Surgical History   Procedure Laterality Date   . Icd     . Hemicolectomy Left    . Cardiac defibrillator placement     . Pacemaker insertion     . Hh picc placement consult hh only  12/10/2012             History reviewed. No pertinent family history.      Social History      reports that she does not drink alcohol. Her tobacco, drug, and sexual activity histories are not on file.    Living Situation    Questions Responses    Patient lives with SNF     Homeless     Caregiver for other family member     External Services     Employment     Domestic Violence Risk           Problem List     Patient Active Problem List   Diagnosis Code   . AKI (acute kidney injury) 584.9   . Unspecified cerebral artery occlusion with cerebral infarction 434.91   . Coronary artery disease 414.00   . AICD (automatic cardioverter/defibrillator) present V45.02   . Hypertension 401.9   . CVA (cerebral infarction) 434.91   . ASHD (arteriosclerotic heart disease) 414.00   . MI (mitral incompetence) 424.0   . CHF (congestive heart failure) 428.0   . COPD (chronic obstructive pulmonary disease) 496   . ETOH abuse 305.00   . VT (ventricular tachycardia) 427.1   . Anemia of chronic disease 285.29   . Parkinsonism 332.0   . C. difficile colitis 008.45   . Compression fracture of L1 lumbar vertebra  805.4   . Sepsis 038.9, 995.91   . 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 Peripheral vascular disease 443.9       Review of Systems   Review of Systems   Constitutional: Negative for fever.   HENT: Negative for congestion.    Eyes: Negative for visual disturbance.   Respiratory: Negative for shortness of breath.    Cardiovascular: Negative for chest pain.   Gastrointestinal: Negative for abdominal pain.   Genitourinary: Negative for dysuria.   Musculoskeletal: Negative for back pain.   Skin: Negative for rash.   Neurological: Negative for headaches.       Physical Exam     ED Triage Vitals   BP Pulse Heart Rate(via Pulse Ox) Resp Temp Temp Source SpO2 O2 Device O2 Flow Rate   01/06/13 1405 -- 01/06/13 1405 01/06/13 1405 01/06/13 1405 01/06/13 1405 01/06/13 1405 01/06/13 1405 --   82/62 mmHg  76 16 36.2 C (97.2 F) TEMPORAL 94 % None (Room air)       Weight           01/06/13 1405           63.504 kg (140 lb)               Physical Exam   Vitals reviewed.  Constitutional: She appears well-developed and well-nourished.   HENT:   Head:  Normocephalic.   Mouth/Throat: Oropharynx is clear and moist.   Eyes: Pupils are equal, round, and reactive to light.   Neck: Neck supple.   Cardiovascular: Normal rate, regular rhythm and normal heart sounds.    Pulmonary/Chest: Effort normal and breath sounds normal.   Abdominal: Soft. Bowel sounds are normal. She exhibits no distension. There is no tenderness.   Neurological: She is alert.   Skin: Skin is warm and dry. She is not diaphoretic.       Medical Decision Making   <EDMDM>    Initial Evaluation:  ED First Provider Contact    Date/Time Event User Comments    01/06/13 1425 ED Provider First Contact Pioche, Arrick Dutton Initial Face to Face Provider Contact          Patient seen by me as above    Assessment:  67 y.o., female comes to the ED with hypotension (ongoing x3 days with clysis at SNF), nausea and vomiting    Differential Diagnosis includes ileus, SBO, UTI, likely dehydration contributing to AKI and hyperkalemia              Plan: insert iv, cbc, chem 8, blood cultures, lactate, CXR, CT ab/pelvis, IVF, EKG for hyperkalemia, telemetry.  EKG with ST elevated in inferior leads.  Patient asked again about chest pain, which she declines.  Cardiology consulted and they performed bedside echocardiogram.  Patient with h/o Vtach and AICD insertion.  EKG changes appear to be chronic, and not related to STEMI.  Labs with K 5.3.  Creatinine 3.  CT scan with SBO and subcutaneous emphysema at right thigh. She was given a dose of antibiotics and discussed with surgery, although the thigh appears to have been site of prior clysis which is most likely etiology of subQ air.  Plan for IVF, NG tube for SBO, hospitalization.      Si Raider, MD    Si Raider, MD  01/06/13 1842    Addendum: Blood pressures had been improving, but are now worsening again.  She  is receiving additional IVF and was seen by the ICU.  Plan for ICU admission.  Attempted to call son Kenard Gower) multiple times, but phone is not working.  Social work  involved.    Critical Care    There is a high probability of imminent or life threatening deterioration due to  shock    This is secondary to SBO    Acute interventions include > 2 liters of IVF, obtaining history from patient or surrogate, examination of patient, ordering and performing treatments and interventions, ordering and review of laboratory studies, ordering and review of radiographic studies, evaluation of the patient's response to treatment and discussion with consultants    Exact time of critical care (exclusive of other billable procedures)  40 minutes        Si Raider, MD  01/06/13 2012

## 2013-01-06 NOTE — Consults (Signed)
Cardiology Consult:    I was asked to see [name] who is a 67 y.o. Female for cardiology consultation on the inpatient service at [facility] by Si Raider, MD  for abnormal ekg with concern for acute MI.    HPI: Michelle [name] Is a 78 -year-old female with a past medical history of coronary artery disease, non-ST elevated myocardial infarction, systolic heart failure with ischemic cardiomyopathy, CVA, and ventricular tachycardia status post dual-chamber ICD [employer]) Placed 07/23/2011. Additional past medical history is well summarized below.    She presented today to [facility] emergency room for evaluation of nausea and vomiting.  She recently underwent a left above-the-knee amputation.  She denies chest pain, shortness of breath, sensation of racing or rapid heart rate, lightheadedness, or dizziness.  She does endorse some nausea and vomiting.  She was recently treated with Rocephin for a UTI.     Initial EKG done in the emergency room showed ST elevations in leads 2, 3 and aVF.  Upon further investigation these changes are not acute.  She is followed by [facility] for cardiac care.       Past Medical History   Diagnosis Date   . CVA (cerebral infarction) 10/23/2011   . ASHD (arteriosclerotic heart disease) 10/23/2011   . MI (mitral incompetence) 10/23/2011   . CHF (congestive heart failure) 10/23/2011     EF 30%   . COPD (chronic obstructive pulmonary disease) 10/23/2011   . VT (ventricular tachycardia) 10/23/2011     S/p AICD   . Anemia of chronic disease 10/23/2011   . HTN (hypertension) 10/23/2011   . Parkinsonism    . Coronary artery disease    . AICD (automatic cardioverter/defibrillator) present    . Myocardial infarction june 2013   . Alcohol abuse, in remission    . Peripheral vascular disease      Left AKA     Past Surgical History   Procedure Laterality Date   . Icd     . Hemicolectomy Left    . Cardiac defibrillator placement     . Pacemaker insertion     . Hh picc  placement consult hh only  12/10/2012           History reviewed. No pertinent family history.  History     Social History   . Marital Status: Widowed     Spouse Name: N/A     Number of Children: N/A   . Years of Education: N/A     Social History Main Topics   . Smoking status: Unknown If Ever Smoked   . Smokeless tobacco: None   . Alcohol Use: No      Comment: formerly, heavy drinker   . Drug Use: None   . Sexual Activity: None     Other Topics Concern   . None     Social History Narrative    ** Merged History Encounter **          History   Smoking status   . Unknown If Ever Smoked   Smokeless tobacco   . Not on file     Allergies: No Known Allergies (drug, envir, food or latex)  No current facility-administered medications for this encounter.     Current Outpatient Prescriptions   Medication   . acetaminophen (TYLENOL) 500 mg tablet   . potassium chloride SA (KLOR-CON M20) 20 mEq CR tablet   . carvedilol (COREG) 6.25 MG tablet   . cyanocobalamin (VITAMIN B-12) 1000  MCG tablet   . aspirin 81 MG EC tablet   . carbidopa-levodopa (SINEMET) 25-100 MG per tablet   . famotidine (PEPCID) 20 MG tablet   . Cholecalciferol (VITAMIN D3) 50000 UNITS CAPS   . amiodarone (PACERONE) 400 MG tablet   . sertraline (ZOLOFT) 50 MG tablet   . atorvastatin (LIPITOR) 10 MG tablet   . HYDROcodone-acetaminophen (NORCO) 5-325 MG per tablet   . guaiFENesin (ROBITUSSIN) 100 MG/5ML syrup   . sorbitol 70 % solution   . ipratropium-albuterol (DUONEB) 0.5-2.5 (3) MG/3ML nebulizer solution   . calcium carbonate-vitamin D 600-400 MG-UNIT per tablet   . bisacodyl (DULCOLAX) 10 MG suppository       Scheduled Medications:        ROS: General: No significant weight change, fatigue, fevers  Skin: No new lesions  Eyes: No vision change  ENT: No nasal symptoms, hearing loss, sore throat  Cardiovascular: HPI  Pulmonary: HPI  GI: + nausea and vomiting  GU: No dysuria or polyuria  Heme: No bleeding/bruisability   Endo: No DM, thyroid  normal  Musculoskeletal: No back pain  Neuro: No headaches      Last Nursing documented pain:  0-10 Scale: 0 (01/06/13 1405)    Wt Readings from Last 3 Encounters:   01/06/13 63.504 kg (140 lb)   12/11/12 58.5 kg (128 lb 15.5 oz)   07/15/12 63.821 kg (140 lb 11.2 oz)         Vital Signs:   Temp:  [36.2 C (97.2 F)] 36.2 C (97.2 F)  Heart Rate:  [71] 71  Resp:  [16] 16  BP: (82-91)/(58-62) 91/58 mmHg   Body mass index is 24.81 kg/(m^2).  CONSTITUTIONAL:  Elderly African American female on hospital stretcher cooperative,pleasant, resting comfortably.  NEURO:  Alert and oriented, in no acute distress.  Grossly intact.  Fine resting tremor consistent with Parkinson's.  HEENT:  Oropharynx clear, mucous membranes dry , anicteric sclerae  LYMPH:  Supple without lymphadenopathy.     CARDIOVASCULAR: Regular rate and rhythm, normal S1, S2, no S3 or S4.  There are no murmurs appreciated.    2+ radial and 1+dorsalis  pedis pulses on the right, AKA on the left  no lower extremity edema noted. JVP is approximately 7 cm above the right atrium.      RESP:  Diminished respiratory effort; clear to auscultation bilaterally without wheezes, rales or rhonchi.  GI:  Soft, nontender, normal active bowel sounds, nondistended.  No abdominal bruits noted.  SKIN:  No rashes or lesions noted. Left AKA; well healed without redness or drainage  Data:      Recent Labs  Lab 01/06/13  1515 01/06/13  0919 01/05/13  0635   WBC 15.5* 19.1* 12.6*   Hemoglobin 11.0* 12.1 10.6*   Hematocrit 35 38 34   Platelets 317 358 335   INR 1.1  --   --           Recent Labs  Lab 01/06/13  1515   Troponin T 0.03*          Recent Labs  Lab 01/06/13  1515 01/06/13  0919 01/05/13  0635  12/31/12  1315   Sodium 138 142 138  < > 141   Potassium 5.3* 5.9* 5.5*  < > 4.3   Chloride 104 103 99  < > 104   CO2 17* 17* 24  < > 24   UN 35* 35* 27*  < > 18   Creatinine 3.34* 3.01* 1.54*  < >  1.10*   Glucose 123* 127*  --   --  126*   Calcium 9.3 9.2 9.2  < > 8.7   < > = values  in this interval not displayed.  No results found for this basename: BNP,  in the last 168 hours      No results found for this basename: CHOL, HDL, LDLC, TRIG, CHHDC,  in the last 168 hours           X-rays the past 24 hours:  No results found.     Most recent major cardiac studies: Two-dimensional echo demonstrates a dilated ischemic cardiomyopathy with severe left ventricular systolic dysfunction.  There is inferoposterior akinesis to dyskinesis.    12 Lead ECG: Sinus rhythm at 70 beats per minutes.  There is approximately 2 mm of the inferior ST elevation with Q waves.  There is reciprocal lateral ST segment depression.  Similar findings were noted on prior tracing from December 04, 1998 4T    I personally obtained this patient's history, performed a comprehensive cardiovascular exam, reviewed the laboratory data, cardiac studies, and interpreted the ECG.    Assessment: Michelle [name] is a 67 y.o. Female with:  Patient Active Problem List   Diagnosis Code   . AKI (acute kidney injury) 584.9   . Unspecified cerebral artery occlusion with cerebral infarction 434.91   . Coronary artery disease 414.00   . AICD (automatic cardioverter/defibrillator) present V45.02   . Hypertension 401.9   . CVA (cerebral infarction) 434.91   . ASHD (arteriosclerotic heart disease) 414.00   . MI (mitral incompetence) 424.0   . CHF (congestive heart failure) 428.0   . COPD (chronic obstructive pulmonary disease) 496   . ETOH abuse 305.00   . VT (ventricular tachycardia) 427.1   . Anemia of chronic disease 285.29   . Parkinsonism 332.0   . C. difficile colitis 008.45   . Compression fracture of L1 lumbar vertebra 805.4   . Sepsis 038.9, 995.91   . 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 Peripheral vascular disease 443.9     Mrs. [name] has severe coronary disease complicated by chronic heart failure with ischemic cardiomyopathy and ventricular tachycardia.  Her inferior ST segment  elevation is likely chronic in nature, secondary to a dyskinetic segment and does not signify ACS.  She appears reasonably well compensated from the standpoint of her congestive heart failure.  I see no evidence of ventricular arrhythmias on telemetry.  Her hypotension and acute kidney injury are likely secondary to hypovolemia from dehydration.  Plan:   I don't think she requires acute intervention or further cardiac evaluation at this time.  Would continue outpatient cardiac regimen.

## 2013-01-06 NOTE — ED Notes (Signed)
Patient to CT on telemetry with nurse and transport.

## 2013-01-06 NOTE — H&P (Signed)
Critical Care H&P for Inpatients    Chief Complaint: none per patient    History of Present Illness:  HPI Comments: 67 yr old female presents with nausea, vomiting, and abd pain from SNF.  She was recently hospitalized with left foot wet gangrene with necrosis and sepsis. She had a left lower leg amputation that was later revised to a AKA amputation.  In the emergency department she was hypotensive and received 3 liters of IVF with improvement in BP.  CT of the abd/pelvis demonstrated SBO with subcutaneous air in the right thigh.  She had been receiving hydration via sq route.  Cultures are pending and antibiotics have been started, vanco/zosyn.  She is very lethargic and only able to provide 1-2 word answers to me.  She denies pain, nausea, or vomiting currently.  She is unable to provide any further history.      Past Medical History   Diagnosis Date   . CVA (cerebral infarction) 10/23/2011   . ASHD (arteriosclerotic heart disease) 10/23/2011   . MI (mitral incompetence) 10/23/2011   . CHF (congestive heart failure) 10/23/2011     EF 30%   . COPD (chronic obstructive pulmonary disease) 10/23/2011   . VT (ventricular tachycardia) 10/23/2011     S/p AICD   . Anemia of chronic disease 10/23/2011   . HTN (hypertension) 10/23/2011   . Parkinsonism    . Coronary artery disease    . AICD (automatic cardioverter/defibrillator) present    . Myocardial infarction june 2013   . Alcohol abuse, in remission    . Peripheral vascular disease      Left AKA     Past Surgical History   Procedure Laterality Date   . Icd     . Hemicolectomy Left    . Cardiac defibrillator placement     . Pacemaker insertion     . Hh picc placement consult hh only  12/10/2012           History reviewed. No pertinent family history.  History     Social History   . Marital Status: Widowed     Spouse Name: N/A     Number of Children: N/A   . Years of Education: N/A     Social History Main Topics   . Smoking status: Unknown If Ever Smoked   . Smokeless tobacco:  None   . Alcohol Use: No      Comment: formerly, heavy drinker   . Drug Use: None   . Sexual Activity: None     Other Topics Concern   . None     Social History Narrative    ** Merged History Encounter **            Allergies: No Known Allergies (drug, envir, food or latex)      (Not in a hospital admission)   No current facility-administered medications for this encounter.     Current Outpatient Prescriptions   Medication   . acetaminophen (TYLENOL) 500 mg tablet   . potassium chloride SA (KLOR-CON M20) 20 mEq CR tablet   . carvedilol (COREG) 6.25 MG tablet   . cyanocobalamin (VITAMIN B-12) 1000 MCG tablet   . aspirin 81 MG EC tablet   . carbidopa-levodopa (SINEMET) 25-100 MG per tablet   . famotidine (PEPCID) 20 MG tablet   . Cholecalciferol (VITAMIN D3) 50000 UNITS CAPS   . amiodarone (PACERONE) 400 MG tablet   . sertraline (ZOLOFT) 50 MG tablet   . atorvastatin (LIPITOR)  10 MG tablet   . HYDROcodone-acetaminophen (NORCO) 5-325 MG per tablet   . guaiFENesin (ROBITUSSIN) 100 MG/5ML syrup   . sorbitol 70 % solution   . ipratropium-albuterol (DUONEB) 0.5-2.5 (3) MG/3ML nebulizer solution   . calcium carbonate-vitamin D 600-400 MG-UNIT per tablet   . bisacodyl (DULCOLAX) 10 MG suppository       Review of Systems:   Review of Systems   Unable to perform ROS: medical condition       Last Nursing documented pain:  0-10 Scale: 0 (01/06/13 1405)      Patient Vitals for the past 24 hrs:   BP Temp Temp src Pulse Resp SpO2 Height Weight   01/06/13 2000 84/40 mmHg 36 C (96.8 F) TEMPORAL 73 14 100 % - -   01/06/13 1930 92/44 mmHg - - 70 14 100 % - -   01/06/13 1910 90/35 mmHg - - 64 15 100 % - -   01/06/13 1905 - - - 56 14 100 % - -   01/06/13 1902 85/40 mmHg - - 51 - 100 % - -   01/06/13 1855 85/40 mmHg - - 51 14 99 % - -   01/06/13 1845 79/29 mmHg - - 60 16 100 % - -   01/06/13 1830 88/48 mmHg - - 55 15 99 % - -   01/06/13 1800 134/63 mmHg - - 50 12 94 % - -   01/06/13 1750 97/33 mmHg - - 54 16 100 % - -   01/06/13 1743  94/47 mmHg - - 51 - 100 % - -   01/06/13 1552 91/58 mmHg - - 71 - 100 % - -   01/06/13 1405 82/62 mmHg 36.2 C (97.2 F) TEMPORAL - 16 94 % 1.6 m (5\' 3" ) 63.504 kg (140 lb)     O2 Device: None (Room air) (01/06/13 1405)      Physical Exam   Vitals reviewed.  Constitutional: She appears well-developed and well-nourished.   HENT:   Head: Normocephalic and atraumatic.   Mouth/Throat: Oropharynx is clear and moist. No oropharyngeal exudate.   Patent airway.   Eyes: Conjunctivae and EOM are normal. Pupils are equal, round, and reactive to light. No scleral icterus.   Neck: No JVD present. No rigidity.   Cardiovascular: Normal rate, regular rhythm, normal heart sounds and intact distal pulses.    Pulmonary/Chest: Effort normal. No accessory muscle usage or stridor. Not tachypneic. No respiratory distress. She has decreased breath sounds. She has no wheezes.   Abdominal: Soft. She exhibits distension ( mild). She exhibits no mass. Bowel sounds are increased. There is tenderness ( diffuse). There is no rigidity, no rebound and no guarding.   Musculoskeletal: She exhibits no edema.   Left AKA stump well healed.  Staples in place.  No signs of infection.  Thigh is non-tender.  No swelling.   Neurological:   Lethargic and unable to fully assess.  She is able to move all extremities and follow some commands.   Skin: Skin is warm and dry.       Lab Results:   All labs in the last 24 hours:   Recent Results (from the past 24 hour(s))   BASIC METABOLIC PANEL    Collection Time     01/06/13  9:19 AM       Result Value Range    Glucose 127 (*) 60 - 99 mg/dL    Sodium 161  096 - 045 mmol/L    Potassium 5.9 (*)  3.3 - 5.1 mmol/L    Chloride 103  96 - 108 mmol/L    CO2 17 (*) 20 - 28 mmol/L    Anion Gap 22 (*) 7 - 16    UN 35 (*) 6 - 20 mg/dL    Creatinine 0.98 (*) 0.51 - 0.95 mg/dL    GFR,Caucasian 15 (*)     GFR,Black 18 (*)     Calcium 9.2  8.6 - 10.2 mg/dL   CBC    Collection Time     01/06/13  9:19 AM       Result Value Range     WBC 19.1 (*) 4.0 - 10.0 THOU/uL    RBC 3.9  3.9 - 5.2 MIL/uL    Hemoglobin 12.1  11.2 - 15.7 g/dL    Hematocrit 38  34 - 45 %    MCV 99 (*) 79 - 95 fL    RDW 18.4 (*) 11.7 - 14.4 %    Platelets 358  160 - 370 THOU/uL   CBC AND DIFFERENTIAL    Collection Time     01/06/13  3:15 PM       Result Value Range    WBC 15.5 (*) 4.0 - 10.0 THOU/uL    RBC 3.6 (*) 3.9 - 5.2 MIL/uL    Hemoglobin 11.0 (*) 11.2 - 15.7 g/dL    Hematocrit 35  34 - 45 %    MCV 98 (*) 79 - 95 fL    RDW 18.3 (*) 11.7 - 14.4 %    Platelets 317  160 - 370 THOU/uL    Seg Neut % 77.0 (*) 34.0 - 71.1 %    Lymphocyte % 13.8 (*) 19.3 - 51.7 %    Monocyte % 8.2  4.7 - 12.5 %    Eosinophil % 0.1 (*) 0.7 - 5.8 %    Basophil % 0.1  0.1 - 1.2 %    Neut # K/uL 12.0 (*) 1.6 - 6.1 THOU/uL    Lymph # K/uL 2.1  1.2 - 3.7 THOU/uL    Mono # K/uL 1.3 (*) 0.2 - 0.9 THOU/uL    Eos # K/uL 0.0  0.0 - 0.4 THOU/uL    Baso # K/uL 0.0  0.0 - 0.1 THOU/uL   BASIC METABOLIC PANEL    Collection Time     01/06/13  3:15 PM       Result Value Range    Glucose 123 (*) 60 - 99 mg/dL    Sodium 119  147 - 829 mmol/L    Potassium 5.3 (*) 3.3 - 5.1 mmol/L    Chloride 104  96 - 108 mmol/L    CO2 17 (*) 20 - 28 mmol/L    Anion Gap 17 (*) 7 - 16    UN 35 (*) 6 - 20 mg/dL    Creatinine 5.62 (*) 0.51 - 0.95 mg/dL    GFR,Caucasian 14 (*)     GFR,Black 16 (*)     Calcium 9.3  8.6 - 10.2 mg/dL   RUQ PANEL (ED ONLY)    Collection Time     01/06/13  3:15 PM       Result Value Range    Amylase 130 (*) 28 - 100 U/L    Lipase 94 (*) 13 - 60 U/L    Total Protein 8.0 (*) 6.3 - 7.7 g/dL    Albumin 3.9  3.5 - 5.2 g/dL    Globulin 4.1  2.7 - 4.3 g/dL  Bilirubin,Total 0.4  0.0 - 1.2 mg/dL    Bilirubin,Direct <5.6  0.0 - 0.3 mg/dL    Alk Phos 213 (*) 35 - 105 U/L    AST 28  0 - 35 U/L    ALT 6  0 - 35 U/L   LACTATE, VENOUS, WHOLE BLOOD    Collection Time     01/06/13  3:15 PM       Result Value Range    Lactate VEN,WB 2.0  0.5 - 2.2 mmol/L   TROPONIN T    Collection Time     01/06/13  3:15 PM       Result  Value Range    Troponin T 0.03 (*) 0.00 - 0.02 ng/mL   PROTIME-INR    Collection Time     01/06/13  3:15 PM       Result Value Range    Protime 11.5  9.2 - 12.3 sec    INR 1.1  1.0 - 1.2   APTT    Collection Time     01/06/13  3:15 PM       Result Value Range    aPTT 28.5  25.8 - 37.9 sec   BLOOD CULTURE    Collection Time     01/06/13  3:16 PM       Result Value Range    Bacterial Blood Culture .     BLOOD CULTURE    Collection Time     01/06/13  3:42 PM       Result Value Range    Bacterial Blood Culture .     URINALYSIS REFLEX TO CULTURE    Collection Time     01/06/13  6:20 PM       Result Value Range    Color, UA Dk Yellow (*) Yellow    Appearance,UR 1+ Cloudy (*) Clear    Specific Gravity,UA 1.022  1.001 - 1.030    Leuk Esterase,UA 2+ (*) NEGATIVE    Nitrite,UA NEG  NEGATIVE    pH,UA 5.0  5.0 - 8.0    Protein,UA 25 (*) NEGATIVE mg/dL    Glucose,UA NORM      Ketones, UA TRACE (*) NEGATIVE    Blood,UA NEG  NEGATIVE   URINE MICROSCOPIC (IQ200)    Collection Time     01/06/13  6:20 PM       Result Value Range    RBC,UA 0 - 2  0 - 2 /hpf    WBC,UA 6 - 10 (*) 0 - 5 /hpf    Hyaline Casts,UA 3 - 10 (*)     Amorphous,UA Present      Yeast,UA 2+ (*)        Radiology Impressions (last 3 days):  Ct Abdomen And Pelvis Without Contrast    01/06/2013   IMPRESSION:   1. Small bowel obstruction. The transition point is not clearly  visualized.   2. Bibasilar consolidation may represent atelectasis and/or pneumonia.   3. Cholelithiasis.   4. Nonspecific right anterior thigh subcutaneous emphysema. Recommend  clinical correlation for recent trauma or surgical procedure to  exclude the possibility of infection.   5. Bibasilar pulmonary consolidations may represent pneumonia and/or  atelectasis. Other entities cannot be definitely excluded. Recommend  further evaluation with chest CT if clinically warranted.   END OF REPORT    * Portable Chest Standard Ap Single View    01/06/2013   IMPRESSION:   Left lower lung field  consolidation may represent atelectasis and/or  pneumonia. Recommend PA and lateral views for further evaluation.   END OF REPORT      Currently Active/Followed Hospital Problems:  Active Hospital Problems    Diagnosis   . SBO (small bowel obstruction)       Assessment: 67 yr old female with extensive past medical history presents with nausea, vomiting, and abd pain.  CT abd/pelvis consistent with SBO.  Hypotensive requiring several IVF boluses and transferred to the ICU for further care.    Plan:     1.  Pulm:  Oxygenating well on nasal cannula.  CXR with consolidation LLL.  No cough or sputum production.  HX COPD.  Duoneb as needed.    2.  CV:  Hypotension secondary to sepsis/hypovolemia/dehydartion.  Not tachycardic, but on b-blocker.  Continue with IVF as needed.  Monitor for fluid overload with history of CHF.  Also has CAD, AICD, HTN, and MI history.  Hold amiodarone, asa, and coreg for now given hypotension and likelihood of recurrent hypotension.  Serial enzymes.  Cardiology consulted by ED.  Echo with ischemic cardiomyopathy, severe global LV dysfunction.    3.  GI:  Abd pain, nausea, and vomiting and CT consistent with SBO.  General surgery following.  Conservative treatment for now.  NGT to low intermittent suction, 500 cc bilious output so far.  Serial abd exams.  Zofran if needed.    4.  ID:  Elevated WBC (15.5) with slight shift, no bandemia.  Afebrile.  CXR no infiltrate.  UA with leukocyte and 6-10 WBC.  Await culture.  Covered for abd source or hospital acquired infection given recent admission, zosyn and vancomycin.  Random vanco level in am.  Blood cultures are pending.  Left AKA site appears benign.  Right leg with sq air does not appear to be source.    5.  FEN/Renal:  IVF: boluses as needed and maintenance fluids.  Slight hyperkalemia, should improve with hydration.  AKI due to sepsis and dehydration.  Foley in place, trend urine output and creatinine.  Peripheral venous lactate was 2.0.   Follow serially.  NPO given SBO.    6.  Heme:  Sq heparin for DVT prophylaxis.  Hct, plts, INR normal.  Will need type and screen.    7.  Neuro:  Very lethargic on my exam, but able to follow some commands in ED.  Now more awake and interactive in ICU.  Hx parkinson's.  Holding sinemet since NPO.      Author: Philomena Doheny, PA  Note created: 01/06/2013  at: 8:20 PM

## 2013-01-06 NOTE — Provider Consult (Signed)
General Surgery Consult Note    Consult requested by: Emergency Department / Medicine team     Reason for Consult: Management of SBO    HPI: Ms. Stana Wakeford is a 67 years old female with PMH significant for CAD, CHF, COPD, CVA, VT s/p AICD, anemia, PVD and abdominal PSH consisted of a left hemicolectomy (not clear why and when). 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 She lives in a nursing facility and has neem having nausea and vomiting for the past  Week. She was brought to the ED early afternoon, on arrival to [facility] ED, she was hypotensive ( SBP: 80s), but was denying any specific complaint. On initial evaluation, on ECG she was found to have ST elevations and elevated troponins., cardiology was consulted and acute coronary syndrome was ruled out.  ED obtained a CT scan for work up of nausea and vomiting which showed dilated proximal small bowel loops and some collapsed loops, but no clear transition point. Of note there was also right anterior thigh subcutaneous emphysema for which vascular surgery has been consulted. Patient has been admitted to the medicine team and we have been consulted to assist with management of SBO.    ROS:  10 point review of system negative except as noted in HPI     Past Medical History:   Past Medical History   Diagnosis Date   . CVA (cerebral infarction) 10/23/2011   . ASHD (arteriosclerotic heart disease) 10/23/2011   . MI (mitral incompetence) 10/23/2011   . CHF (congestive heart failure) 10/23/2011     EF 30%   . COPD (chronic obstructive pulmonary disease) 10/23/2011   . VT (ventricular tachycardia) 10/23/2011     S/p AICD   . Anemia of chronic disease 10/23/2011   . HTN (hypertension) 10/23/2011   . Parkinsonism    . Coronary artery disease    . AICD (automatic cardioverter/defibrillator) present    . Myocardial infarction june 2013   . Alcohol abuse, in remission    . Peripheral vascular disease      Left AKA       Past Surgical History:   Past Surgical History    Procedure Laterality Date   . Icd     . Hemicolectomy Left    . Cardiac defibrillator placement     . Pacemaker insertion     . Hh picc placement consult hh only  12/10/2012             Allergies/Medications reviewed in erecord. No Known Allergies (drug, envir, food or latex)    Family history/social history reviewed in erecord. family history is not on file.    On exam:  BP 85/40  Pulse 51  Temp(Src) 36.2 C (97.2 F) (Temporal)  Resp 14  Ht 1.6 m (5\' 3" )  Wt 63.504 kg (140 lb)  BMI 24.81 kg/m2  SpO2 100%       Physical Exam:    General Appearance: In no apparent distress  Cardiac: RRR, no m/r/g  Respiratory: Unlabored work of breathing   Abdomen: Soft, mildly distended, tender mainly in the RUQ, no rebound or guarding  Genitourinary: No foley   Extremities: warm, well-perfused; no cyanosis    Labs reviewed.     Recent Results (from the past 24 hour(s))   BASIC METABOLIC PANEL    Collection Time     01/06/13  9:19 AM       Result Value Range    Glucose 127 (*)  60 - 99 mg/dL    Sodium 147  829 - 562 mmol/L    Potassium 5.9 (*) 3.3 - 5.1 mmol/L    Chloride 103  96 - 108 mmol/L    CO2 17 (*) 20 - 28 mmol/L    Anion Gap 22 (*) 7 - 16    UN 35 (*) 6 - 20 mg/dL    Creatinine 1.30 (*) 0.51 - 0.95 mg/dL    GFR,Caucasian 15 (*)     GFR,Black 18 (*)     Calcium 9.2  8.6 - 10.2 mg/dL   CBC    Collection Time     01/06/13  9:19 AM       Result Value Range    WBC 19.1 (*) 4.0 - 10.0 THOU/uL    RBC 3.9  3.9 - 5.2 MIL/uL    Hemoglobin 12.1  11.2 - 15.7 g/dL    Hematocrit 38  34 - 45 %    MCV 99 (*) 79 - 95 fL    RDW 18.4 (*) 11.7 - 14.4 %    Platelets 358  160 - 370 THOU/uL   CBC AND DIFFERENTIAL    Collection Time     01/06/13  3:15 PM       Result Value Range    WBC 15.5 (*) 4.0 - 10.0 THOU/uL    RBC 3.6 (*) 3.9 - 5.2 MIL/uL    Hemoglobin 11.0 (*) 11.2 - 15.7 g/dL    Hematocrit 35  34 - 45 %    MCV 98 (*) 79 - 95 fL    RDW 18.3 (*) 11.7 - 14.4 %    Platelets 317  160 - 370 THOU/uL    Seg Neut % 77.0 (*) 34.0 - 71.1 %     Lymphocyte % 13.8 (*) 19.3 - 51.7 %    Monocyte % 8.2  4.7 - 12.5 %    Eosinophil % 0.1 (*) 0.7 - 5.8 %    Basophil % 0.1  0.1 - 1.2 %    Neut # K/uL 12.0 (*) 1.6 - 6.1 THOU/uL    Lymph # K/uL 2.1  1.2 - 3.7 THOU/uL    Mono # K/uL 1.3 (*) 0.2 - 0.9 THOU/uL    Eos # K/uL 0.0  0.0 - 0.4 THOU/uL    Baso # K/uL 0.0  0.0 - 0.1 THOU/uL   BASIC METABOLIC PANEL    Collection Time     01/06/13  3:15 PM       Result Value Range    Glucose 123 (*) 60 - 99 mg/dL    Sodium 865  784 - 696 mmol/L    Potassium 5.3 (*) 3.3 - 5.1 mmol/L    Chloride 104  96 - 108 mmol/L    CO2 17 (*) 20 - 28 mmol/L    Anion Gap 17 (*) 7 - 16    UN 35 (*) 6 - 20 mg/dL    Creatinine 2.95 (*) 0.51 - 0.95 mg/dL    GFR,Caucasian 14 (*)     GFR,Black 16 (*)     Calcium 9.3  8.6 - 10.2 mg/dL   RUQ PANEL (ED ONLY)    Collection Time     01/06/13  3:15 PM       Result Value Range    Amylase 130 (*) 28 - 100 U/L    Lipase 94 (*) 13 - 60 U/L    Total Protein 8.0 (*) 6.3 - 7.7 g/dL  Albumin 3.9  3.5 - 5.2 g/dL    Globulin 4.1  2.7 - 4.3 g/dL    Bilirubin,Total 0.4  0.0 - 1.2 mg/dL    Bilirubin,Direct <0.9  0.0 - 0.3 mg/dL    Alk Phos 604 (*) 35 - 105 U/L    AST 28  0 - 35 U/L    ALT 6  0 - 35 U/L   LACTATE, VENOUS, WHOLE BLOOD    Collection Time     01/06/13  3:15 PM       Result Value Range    Lactate VEN,WB 2.0  0.5 - 2.2 mmol/L   TROPONIN T    Collection Time     01/06/13  3:15 PM       Result Value Range    Troponin T 0.03 (*) 0.00 - 0.02 ng/mL   PROTIME-INR    Collection Time     01/06/13  3:15 PM       Result Value Range    Protime 11.5  9.2 - 12.3 sec    INR 1.1  1.0 - 1.2   APTT    Collection Time     01/06/13  3:15 PM       Result Value Range    aPTT 28.5  25.8 - 37.9 sec   BLOOD CULTURE    Collection Time     01/06/13  3:16 PM       Result Value Range    Bacterial Blood Culture .     BLOOD CULTURE    Collection Time     01/06/13  3:42 PM       Result Value Range    Bacterial Blood Culture .     URINALYSIS REFLEX TO CULTURE    Collection Time      01/06/13  6:20 PM       Result Value Range    Color, UA Dk Yellow (*) Yellow    Appearance,UR 1+ Cloudy (*) Clear    Specific Gravity,UA 1.022  1.001 - 1.030    Leuk Esterase,UA 2+ (*) NEGATIVE    Nitrite,UA NEG  NEGATIVE    pH,UA 5.0  5.0 - 8.0    Protein,UA 25 (*) NEGATIVE mg/dL    Glucose,UA NORM      Ketones, UA TRACE (*) NEGATIVE    Blood,UA NEG  NEGATIVE   URINE MICROSCOPIC (IQ200)    Collection Time     01/06/13  6:20 PM       Result Value Range    RBC,UA 0 - 2  0 - 2 /hpf    WBC,UA 6 - 10 (*) 0 - 5 /hpf    Hyaline Casts,UA 3 - 10 (*)     Amorphous,UA Present      Yeast,UA 2+ (*)          Scheduled Meds:   Continuous Infusions:  . vancomycin 1,000 mg (01/06/13 1900)     PRN Meds:.    Ct Abdomen And Pelvis Without Contrast    01/06/2013   IMPRESSION:   1. Small bowel obstruction. The transition point is not clearly  visualized.   2. Bibasilar consolidation may represent atelectasis and/or pneumonia.   3. Cholelithiasis.   4. Nonspecific right anterior thigh subcutaneous emphysema. Recommend  clinical correlation for recent trauma or surgical procedure to  exclude the possibility of infection.   5. Bibasilar pulmonary consolidations may represent pneumonia and/or  atelectasis. Other entities cannot be definitely excluded. Recommend  further evaluation with chest CT  if clinically warranted.   END OF REPORT    * Portable Chest Standard Ap Single View    01/06/2013   IMPRESSION:   Left lower lung field consolidation may represent atelectasis and/or  pneumonia. Recommend PA and lateral views for further evaluation.   END OF REPORT      Assessment and plan:     Deanndra Cooprider 67 y.o. female  LOS: 0 days  with PMH significant for CAD, CHF, COPD, CVA, VT s/p AICD, anemia, PVD and abdominal PSH consisted of a left hemicolectomy.  Presented [facility] the [facility] ED from nursing home after one week history of nausea and vomiting, on our evaluation, she denies abdominal pain, on exam she is moderately distended and tender mainly on the  right side, but no peritoneal sign, CT scan with dilated loop of bowel suggestive for SBO, WBC=15.5, venous lactate of 2 and cr=3.34 ( baseline: 0.99 - 1.12). Patient however, refusing to have any surgical intervention.      -- No acute surgical intervention is indicated at this point  -- NPO, IVF  -- Continue NG  tube to LIWS  -- Patient clearly dehydrate, with BUN=35 and Cr=3.34, continue resuscitation  -- Continue Foley, strict I&O  -- pen IV antiemetic and analgesia   -- Daily electrolyte check and replete as needed   -- We will continue to follow, if she does not improve  clinically she may need a surgical intervention   -- Patient currently refusing any surgical intervention, however there is a need for discussion with patient's next of kin ( son: Kenard Gower ?) about possibility of need for surgical intervention, if conservative management fails      Patient was seen by Dr. Laural Benes and above note reflects his recommendations.     Reece Agar, MD

## 2013-01-07 LAB — BASIC METABOLIC PANEL
Anion Gap: 12 (ref 7–16)
Anion Gap: 11 (ref 7–16)
Anion Gap: 13 (ref 7–16)
CO2: 19 mmol/L — ABNORMAL LOW (ref 20–28)
CO2: 21 mmol/L (ref 20–28)
CO2: 19 mmol/L — ABNORMAL LOW (ref 20–28)
Calcium: 7 mg/dL — ABNORMAL LOW (ref 8.6–10.2)
Calcium: 7 mg/dL — ABNORMAL LOW (ref 8.6–10.2)
Calcium: 7.5 mg/dL — ABNORMAL LOW (ref 8.6–10.2)
Chloride: 115 mmol/L — ABNORMAL HIGH (ref 96–108)
Chloride: 115 mmol/L — ABNORMAL HIGH (ref 96–108)
Chloride: 116 mmol/L — ABNORMAL HIGH (ref 96–108)
Creatinine: 2.28 mg/dL — ABNORMAL HIGH (ref 0.51–0.95)
Creatinine: 2.15 mg/dL — ABNORMAL HIGH (ref 0.51–0.95)
Creatinine: 1.85 mg/dL — ABNORMAL HIGH (ref 0.51–0.95)
GFR,Black: 25 * — AB
GFR,Black: 27 * — AB
GFR,Black: 32 * — AB
GFR,Caucasian: 21 * — AB
GFR,Caucasian: 23 * — AB
GFR,Caucasian: 28 * — AB
Glucose: 82 mg/dL (ref 60–99)
Glucose: 77 mg/dL (ref 60–99)
Glucose: 72 mg/dL (ref 60–99)
Lab: 26 mg/dL — ABNORMAL HIGH (ref 6–20)
Lab: 25 mg/dL — ABNORMAL HIGH (ref 6–20)
Lab: 25 mg/dL — ABNORMAL HIGH (ref 6–20)
Potassium: 3.6 mmol/L (ref 3.3–5.1)
Potassium: 4.2 mmol/L (ref 3.3–5.1)
Potassium: 3.7 mmol/L (ref 3.3–5.1)
Sodium: 146 mmol/L — ABNORMAL HIGH (ref 133–145)
Sodium: 147 mmol/L — ABNORMAL HIGH (ref 133–145)
Sodium: 148 mmol/L — ABNORMAL HIGH (ref 133–145)

## 2013-01-07 LAB — BLOOD GAS, ARTERIAL
Base Excess, Arterial: -12
CO2,ART (Calc): 16 mmol/L — ABNORMAL LOW (ref 21–28)
CO: 0 % (ref 0.0–1.4)
FO2 Hb, Arterial: 93 % (ref 90–95)
HCO3, Arterial: 15 mmol/L — ABNORMAL LOW (ref 22–30)
Hemoglobin: 8.4 g/dL — ABNORMAL LOW (ref 11.2–15.7)
Methemoglobin: 0.3 % (ref 0.0–1.0)
pCO2, Arterial: 38 mm Hg (ref 35–45)
pH: 7.22 — ABNORMAL LOW (ref 7.36–7.44)
pO2,Arterial: 83 mm Hg (ref 80–100)

## 2013-01-07 LAB — AMYLASE: Amylase: 67 U/L (ref 28–100)

## 2013-01-07 LAB — HEPATIC FUNCTION PANEL
ALT: <5 U/L (ref 0–35)
AST: 15 U/L (ref 0–35)
Albumin: 2.4 g/dL — ABNORMAL LOW (ref 3.5–5.2)
Alk Phos: 97 U/L (ref 35–105)
Bilirubin,Direct: <0.2 mg/dL (ref 0.0–0.3)
Bilirubin,Total: 0.3 mg/dL (ref 0.0–1.2)
Globulin: 2.6 g/dL — ABNORMAL LOW (ref 2.7–4.3)
Total Protein: 5 g/dL — ABNORMAL LOW (ref 6.3–7.7)

## 2013-01-07 LAB — CBC AND DIFFERENTIAL
Baso # K/uL: 0 10*3/uL (ref 0.0–0.1)
Basophil %: 0.1 % (ref 0.1–1.2)
Eos # K/uL: 0 10*3/uL (ref 0.0–0.4)
Eosinophil %: 0.1 % — ABNORMAL LOW (ref 0.7–5.8)
Hematocrit: 24 % — ABNORMAL LOW (ref 34–45)
Hemoglobin: 7.3 g/dL — ABNORMAL LOW (ref 11.2–15.7)
Lymph # K/uL: 1.6 10*3/uL (ref 1.2–3.7)
Lymphocyte %: 19.8 % (ref 19.3–51.7)
MCV: 100 fL — ABNORMAL HIGH (ref 79–95)
Mono # K/uL: 0.8 10*3/uL (ref 0.2–0.9)
Monocyte %: 10.5 % (ref 4.7–12.5)
Neut # K/uL: 5.5 10*3/uL (ref 1.6–6.1)
Platelets: 193 10*3/uL (ref 160–370)
RBC: 2.4 MIL/uL — ABNORMAL LOW (ref 3.9–5.2)
RDW: 18.4 % — ABNORMAL HIGH (ref 11.7–14.4)
Seg Neut %: 68.6 % (ref 34.0–71.1)
WBC: 8 10*3/uL (ref 4.0–10.0)

## 2013-01-07 LAB — HEMATOCRIT: Hematocrit: 24 % — ABNORMAL LOW (ref 34–45)

## 2013-01-07 LAB — PHOSPHORUS: Phosphorus: 5.1 mg/dL — ABNORMAL HIGH (ref 2.7–4.5)

## 2013-01-07 LAB — CK ISOENZYMES
CK: 19 U/L — ABNORMAL LOW (ref 34–145)
Mass CKMB: 1.2 ng/mL (ref 0.0–2.9)

## 2013-01-07 LAB — POCT GLUCOSE
Glucose POCT: 89 mg/dL (ref 60–99)
Glucose POCT: 95 mg/dL (ref 60–99)
Glucose POCT: 69 mg/dL (ref 60–99)

## 2013-01-07 LAB — LIPASE: Lipase: 48 U/L (ref 13–60)

## 2013-01-07 LAB — TROPONIN T: Troponin T: <0.01 ng/mL (ref 0.00–0.02)

## 2013-01-07 LAB — MAGNESIUM: Magnesium: 1.3 mEq/L (ref 1.3–2.1)

## 2013-01-07 LAB — AEROBIC CULTURE

## 2013-01-07 LAB — LACTATE, ARTERIAL, WHOLE BLOOD: Lactate ART,WB: 0.8 mmol/L (ref 0.3–0.8)

## 2013-01-07 LAB — VANCOMYCIN, RANDOM: Vancomycin Level: 18.2 ug/mL

## 2013-01-07 MED ORDER — SODIUM BICARBONATE 8.4 % IV SOLN *I*
50.0000 meq | Freq: Once | INTRAVENOUS | Status: AC
Start: 2013-01-07 — End: 2013-01-07
  Administered 2013-01-07: 50 meq via INTRAVENOUS
  Filled 2013-01-07: qty 50

## 2013-01-07 MED ORDER — METRONIDAZOLE IN NACL 5 MG/ML IV SOLN
500.0000 mg | Freq: Three times a day (TID) | INTRAVENOUS | Status: DC
Start: 2013-01-07 — End: 2013-01-09
  Administered 2013-01-07 – 2013-01-09 (×6): 500 mg via INTRAVENOUS
  Filled 2013-01-07 (×11): qty 100

## 2013-01-07 MED ORDER — CALCIUM GLUCONATE 10 % IV SOLN *I*
9.4000 meq | Freq: Once | INTRAVENOUS | Status: AC
Start: 2013-01-07 — End: 2013-01-07
  Administered 2013-01-07: 9.4 meq via INTRAVENOUS
  Filled 2013-01-07: qty 20

## 2013-01-07 MED ORDER — SODIUM CHLORIDE 0.9 % IV SOLN WRAPPED *I*
1.0000 ug/min | Status: DC
Start: 2013-01-07 — End: 2013-01-07
  Filled 2013-01-07: qty 8

## 2013-01-07 MED ORDER — POTASSIUM CHLORIDE 20 MEQ/50ML IV SOLN *I*
20.0000 meq | Freq: Once | INTRAVENOUS | Status: AC
Start: 2013-01-07 — End: 2013-01-07
  Administered 2013-01-07: 20 meq via INTRAVENOUS
  Filled 2013-01-07: qty 50

## 2013-01-07 MED ORDER — STERILE WATER FOR INJECTION IV SOLN *I*
150.0000 mL/h | INTRAVENOUS | Status: DC
Start: 2013-01-07 — End: 2013-01-07
  Administered 2013-01-07 (×7): 150 mL/h via INTRAVENOUS
  Filled 2013-01-07: qty 150

## 2013-01-07 MED ORDER — FAMOTIDINE IN NACL 20 MG/50ML IV SOLN *I*
20.0000 mg | Freq: Every day | INTRAVENOUS | Status: DC
Start: 2013-01-07 — End: 2013-01-08
  Administered 2013-01-07 – 2013-01-08 (×2): 20 mg via INTRAVENOUS
  Filled 2013-01-07 (×2): qty 50

## 2013-01-07 MED ORDER — DEXTROSE 5 % IV SOLN WRAPPED *I*
25.0000 mL/h | INTRAVENOUS | Status: DC
Start: 2013-01-07 — End: 2013-01-08
  Administered 2013-01-07 (×11): 25 mL/h via INTRAVENOUS

## 2013-01-07 MED ORDER — MAGNESIUM SULFATE 50 % IJ SOLN *I*
3000.0000 mg | Freq: Once | INTRAVENOUS | Status: AC
Start: 2013-01-07 — End: 2013-01-07
  Administered 2013-01-07: 3000 mg via INTRAVENOUS
  Filled 2013-01-07: qty 6

## 2013-01-07 MED ORDER — CEFEPIME HCL 2 GM/100ML IV SOLN *I*
2.0000 g | Freq: Every day | INTRAVENOUS | Status: DC
Start: 2013-01-07 — End: 2013-01-08
  Administered 2013-01-07 – 2013-01-08 (×2): 2 g via INTRAVENOUS
  Filled 2013-01-07 (×3): qty 100

## 2013-01-07 NOTE — Progress Notes (Signed)
General Daily SOAP Progress Note for Inpatients   LOS: 1 day     Subjective Pt transferred to ICU O/N for resuscitation (received total 6L IVF in ED and ICU, last IVF bolus at [facility]) with improvement in UOP and Cr this morning. NGT with 900cc bilious output on LIWS, pt denies any abdominal pain this morning. Unclear if she is passing flatus, no BM O/N. WBC improved and Hct 24 (from 35), likely secondary to fluid shifts with resuscitation.     Vitals: Blood pressure 93/50, pulse 70, temperature 36.2 C (97.2 F), temperature source Temporal, resp. rate 24, height 1.6 m (5\' 3" ), weight 56.1 kg (123 lb 10.9 oz), SpO2 100.00%.   Vital-Signs Ranges: Temp:  [36 C (96.8 F)-36.2 C (97.2 F)] 36.2 C (97.2 F)  Heart Rate:  [50-84] 70  Resp:  [12-24] 24  BP: (79-150)/(29-122) 93/50 mmHg    O2 Device: Nasal cannula (01/07/13 0600)  O2 Flow Rate: 2 L/min (01/07/13 0600)    Intake/Output last 3 shifts:  I/O last 3 completed shifts:  11/24 2300 - 11/25 2259  In: 3000 (47.2 mL/kg) [I.V.:3000 (2 mL/kg/hr)]  Out: 1115 (17.6 mL/kg) [Urine:115 (0.1 mL/kg/hr); Emesis/NG output:500; Other:500]  Net: 1885  Weight used: 63.5 kg  Intake/Output this shift:  I/O this shift:  11/25 2300 - 11/26 0659  In: 2347.5 (41.8 mL/kg) [I.V.:2297.5; IV Piggyback:50]  Out: 685 (12.2 mL/kg) [Urine:285; Emesis/NG output:400]  Net: 1662.5  Weight used: 56.1 kg    Nursing pain score: Last Nursing documented pain:  0-10 Scale: 0 (01/07/13 0600)    Objective  NAD, NGT in place to LIWS with bilious drainage   RRR  Decreased BS at BL bases, CTA elsewhere  abd soft, mildly distended, lower ML scar well-healed, NT, BS hypoactive   L AKA site healing well with staples in place, R groin with central line in place, R thigh without crepitus or erythema, RLE [facility]    Lab Results:   All labs in the last 24 hours:   Recent Results (from the past 24 hour(s))   BASIC METABOLIC PANEL    Collection Time     01/06/13  9:19 AM       Result Value Range    Glucose 127 (*) 60 - 99  mg/dL    Sodium 161  096 - 045 mmol/L    Potassium 5.9 (*) 3.3 - 5.1 mmol/L    Chloride 103  96 - 108 mmol/L    CO2 17 (*) 20 - 28 mmol/L    Anion Gap 22 (*) 7 - 16    UN 35 (*) 6 - 20 mg/dL    Creatinine 4.09 (*) 0.51 - 0.95 mg/dL    GFR,Caucasian 15 (*)     GFR,Black 18 (*)     Calcium 9.2  8.6 - 10.2 mg/dL   CBC    Collection Time     01/06/13  9:19 AM       Result Value Range    WBC 19.1 (*) 4.0 - 10.0 THOU/uL    RBC 3.9  3.9 - 5.2 MIL/uL    Hemoglobin 12.1  11.2 - 15.7 g/dL    Hematocrit 38  34 - 45 %    MCV 99 (*) 79 - 95 fL    RDW 18.4 (*) 11.7 - 14.4 %    Platelets 358  160 - 370 THOU/uL   CBC AND DIFFERENTIAL    Collection Time     01/06/13  3:15 PM  Result Value Range    WBC 15.5 (*) 4.0 - 10.0 THOU/uL    RBC 3.6 (*) 3.9 - 5.2 MIL/uL    Hemoglobin 11.0 (*) 11.2 - 15.7 g/dL    Hematocrit 35  34 - 45 %    MCV 98 (*) 79 - 95 fL    RDW 18.3 (*) 11.7 - 14.4 %    Platelets 317  160 - 370 THOU/uL    Seg Neut % 77.0 (*) 34.0 - 71.1 %    Lymphocyte % 13.8 (*) 19.3 - 51.7 %    Monocyte % 8.2  4.7 - 12.5 %    Eosinophil % 0.1 (*) 0.7 - 5.8 %    Basophil % 0.1  0.1 - 1.2 %    Neut # K/uL 12.0 (*) 1.6 - 6.1 THOU/uL    Lymph # K/uL 2.1  1.2 - 3.7 THOU/uL    Mono # K/uL 1.3 (*) 0.2 - 0.9 THOU/uL    Eos # K/uL 0.0  0.0 - 0.4 THOU/uL    Baso # K/uL 0.0  0.0 - 0.1 THOU/uL   BASIC METABOLIC PANEL    Collection Time     01/06/13  3:15 PM       Result Value Range    Glucose 123 (*) 60 - 99 mg/dL    Sodium 604  540 - 981 mmol/L    Potassium 5.3 (*) 3.3 - 5.1 mmol/L    Chloride 104  96 - 108 mmol/L    CO2 17 (*) 20 - 28 mmol/L    Anion Gap 17 (*) 7 - 16    UN 35 (*) 6 - 20 mg/dL    Creatinine 1.91 (*) 0.51 - 0.95 mg/dL    GFR,Caucasian 14 (*)     GFR,Black 16 (*)     Calcium 9.3  8.6 - 10.2 mg/dL   RUQ PANEL (ED ONLY)    Collection Time     01/06/13  3:15 PM       Result Value Range    Amylase 130 (*) 28 - 100 U/L    Lipase 94 (*) 13 - 60 U/L    Total Protein 8.0 (*) 6.3 - 7.7 g/dL    Albumin 3.9  3.5 - 5.2 g/dL    Globulin  4.1  2.7 - 4.3 g/dL    Bilirubin,Total 0.4  0.0 - 1.2 mg/dL    Bilirubin,Direct <4.7  0.0 - 0.3 mg/dL    Alk Phos 829 (*) 35 - 105 U/L    AST 28  0 - 35 U/L    ALT 6  0 - 35 U/L   LACTATE, VENOUS, WHOLE BLOOD    Collection Time     01/06/13  3:15 PM       Result Value Range    Lactate VEN,WB 2.0  0.5 - 2.2 mmol/L   TROPONIN T    Collection Time     01/06/13  3:15 PM       Result Value Range    Troponin T 0.03 (*) 0.00 - 0.02 ng/mL   PROTIME-INR    Collection Time     01/06/13  3:15 PM       Result Value Range    Protime 11.5  9.2 - 12.3 sec    INR 1.1  1.0 - 1.2   APTT    Collection Time     01/06/13  3:15 PM       Result Value Range    aPTT  28.5  25.8 - 37.9 sec   BLOOD CULTURE    Collection Time     01/06/13  3:16 PM       Result Value Range    Bacterial Blood Culture .     BLOOD CULTURE    Collection Time     01/06/13  3:42 PM       Result Value Range    Bacterial Blood Culture .     URINALYSIS REFLEX TO CULTURE    Collection Time     01/06/13  6:20 PM       Result Value Range    Color, UA Dk Yellow (*) Yellow    Appearance,UR 1+ Cloudy (*) Clear    Specific Gravity,UA 1.022  1.001 - 1.030    Leuk Esterase,UA 2+ (*) NEGATIVE    Nitrite,UA NEG  NEGATIVE    pH,UA 5.0  5.0 - 8.0    Protein,UA 25 (*) NEGATIVE mg/dL    Glucose,UA NORM      Ketones, UA TRACE (*) NEGATIVE    Blood,UA NEG  NEGATIVE   URINE MICROSCOPIC (IQ200)    Collection Time     01/06/13  6:20 PM       Result Value Range    RBC,UA 0 - 2  0 - 2 /hpf    WBC,UA 6 - 10 (*) 0 - 5 /hpf    Hyaline Casts,UA 3 - 10 (*)     Amorphous,UA Present      Yeast,UA 2+ (*)    TROPONIN T    Collection Time     01/07/13  1:40 AM       Result Value Range    Troponin T <0.01  0.00 - 0.02 ng/mL   CK ISOENZYMES    Collection Time     01/07/13  1:40 AM       Result Value Range    CK 19 (*) 34 - 145 U/L    Mass CKMB 1.2  0.0 - 2.9 ng/mL   LACTATE, ARTERIAL, WHOLE BLOOD    Collection Time     01/07/13  1:40 AM       Result Value Range    Lactate ART,WB 0.8  0.3 - 0.8 mmol/L    BLOOD GAS, ARTERIAL    Collection Time     01/07/13  1:40 AM       Result Value Range    Hemoglobin 8.4 (*) 11.2 - 15.7 g/dL    pH 1.91 (*) 4.78 - 2.95    pCO2, Arterial 38  35 - 45 mm Hg    pO2,Arterial 83  80 - 100 mm Hg    HCO3, Arterial 15 (*) 22 - 30 mmol/L    Base Excess, Arterial -12      CO2,ART (Calc) 16 (*) 21 - 28 mmol/L    FO2 Hb, Arterial 93  90 - 95 %    CO 0.0  0.0 - 1.4 %    Methemoglobin 0.3  0.0 - 1.0 %   POCT GLUCOSE    Collection Time     01/07/13  1:52 AM       Result Value Range    Glucose POCT 89  60 - 99 mg/dL   BASIC METABOLIC PANEL    Collection Time     01/07/13  5:59 AM       Result Value Range    Glucose 82  60 - 99 mg/dL    Sodium 621 (*) 308 - 145 mmol/L  Potassium 3.6  3.3 - 5.1 mmol/L    Chloride 115 (*) 96 - 108 mmol/L    CO2 19 (*) 20 - 28 mmol/L    Anion Gap 12  7 - 16    UN 26 (*) 6 - 20 mg/dL    Creatinine 1.61 (*) 0.51 - 0.95 mg/dL    GFR,Caucasian 21 (*)     GFR,Black 25 (*)     Calcium 7.0 (*) 8.6 - 10.2 mg/dL        *Note: Due to a large number of results for the requested time period, some results have not been displayed. A complete set of results can be found in Results Review.       Radiology impressions (last 3 days):  Ct Abdomen And Pelvis Without Contrast    01/06/2013   IMPRESSION:   1. Small bowel obstruction. The transition point is not clearly  visualized.   2. Bibasilar consolidation may represent atelectasis and/or pneumonia.   3. Cholelithiasis.   4. Nonspecific right anterior thigh subcutaneous emphysema. Recommend  clinical correlation for recent trauma or surgical procedure to  exclude the possibility of infection.   5. Bibasilar pulmonary consolidations may represent pneumonia and/or  atelectasis. Other entities cannot be definitely excluded. Recommend  further evaluation with chest CT if clinically warranted.   END OF REPORT    * Portable Chest Standard Ap Single View    01/06/2013   IMPRESSION:   Left lower lung field consolidation may represent  atelectasis and/or  pneumonia. Recommend PA and lateral views for further evaluation.   END OF REPORT      Currently Active/Followed Hospital Problems:  Active Hospital Problems    Diagnosis   . SBO (small bowel obstruction)   . Hypotension   . Acute kidney injury   . Dehydration     Assessment and Plan Section:  Assessment & Plan  67-yo F with hx CAD, CHF, COPD, CVA, VT s/p AICD, anemia, PVD s/p left AKA (12/04/12), HD#1 for SBO and initial concern for possible necrotizing soft tissue infection due to SQ emphysema in R groin/thigh seen on CT from 01/06/13 (pt receiving clysis in R thigh at SNF).  - R groin/thigh without crepitus or erythema at this time to suggest necrotizing fascitis  - Vascular Surgery team will remove staples in L AKA site today, may leave incision open to air  - SBO management per Surgery team 2  - cont current resuscitation per ICU  - please page surgery division 1 pager for any questions or issues     Author: Wilhemina Cash, MD  as of: 01/07/2013  at: 6:52 AM

## 2013-01-07 NOTE — Progress Notes (Addendum)
SW met briefly with pt at bedside to check in. Pt reports that she is feeling better and does not identify any immediate SW needs at this time. SW spoke with Misty Stanley in admissions at [facility] [phone]) who reports that pt has one Medicaid bed hold day remaining (will be used for yesterday). Misty Stanley will check with [facility] administrator Kim re: extending bed hold to pt as a courtesy. Misty Stanley will Lexicographer as able, most likely later this afternoon. SW will continue to follow.  Matilde Bash, LMSW  01/07/2013  10:26 AM    SW notified by Misty Stanley at [facility] that facility will extend pt's bed hold as a courtesy. SW faxed referral to the placement office to open [facility].  Matilde Bash, LMSW  01/07/2013  1:02 PM

## 2013-01-07 NOTE — Progress Notes (Signed)
ICU Daily Progress Note for Inpatients   LOS: 1 day       Interval History:   High NG outputs but overall, she says she feels better  BP, UO improved  Small BM passed    Subjective:   More comfortable    Objective Section:  Temp:  [36 C (96.8 F)-36.2 C (97.2 F)] 36.2 C (97.2 F)  Heart Rate:  [50-84] 70  Resp:  [12-24] 24  BP: (79-150)/(29-122) 93/50 mmHg     Intake/Output Summary (Last 24 hours) at 01/07/13 5409  Last data filed at 01/07/13 0559   Gross per 24 hour   Intake 5347.5 ml   Output   1800 ml   Net 3547.5 ml       Vent Settings:       Date Mechanical ventilation commenced:  n/a    CVP:  12     Last BM:  11/25    Current/admission weight:  --- / 56 Kg    Physical Exam:  Gen: Sleeping comfortably  HEENT: mmm moist, neck supple  Pulm: diminished at the bases but otherwise clear  Cor: RRR, mild sinus arrythmia  Abd: moderately tender in the RUQ where the highest pitched tinkly BS are. BS otherwise slow, but present.  Green NG aspirate  Ext: No peripheral edema. Right thigh w/o crepitus. Left LE AKA incision clean and dry. Staples intact  Neuro: MAE  Integument: Surgical wound as above    Lab Results:    Recent Labs  Lab 01/07/13  0559 01/07/13  0140 01/06/13  1515 01/06/13  0919   WBC 8.0  --  15.5* 19.1*   Hemoglobin 7.3* 8.4* 11.0* 12.1   Hematocrit 24*  --  35 38   Platelets 193  --  317 358       Recent Labs  Lab 01/07/13  0559 01/06/13  1515 01/06/13  0919   Sodium 146* 138 142   Potassium 3.6 5.3* 5.9*   Chloride 115* 104 103   CO2 19* 17* 17*   UN 26* 35* 35*   Creatinine 2.28* 3.34* 3.01*   Glucose 82 123* 127*   Magnesium 1.3  --   --    Phosphorus 5.1*  --   --        Recent Labs  Lab 01/06/13  1515   Protime 11.5       Recent Labs  Lab 01/07/13  0140 01/06/13  1515   Troponin T <0.01 0.03*       Recent Labs  Lab 01/07/13  0559 01/06/13  1515   Total Protein 5.0* 8.0*       Recent Labs  Lab 01/07/13  0140 01/06/13  1515   Lactate ART,WB 0.8  --    Lactate VEN,WB  --  2.0        Micro:  11/21  Urine clean catch >100K yeast  11/25 Blood Cx x 2: NGTD  11/25 U/A 2+ LE, neg nitrate, 6-10 WBC, 3-10 hyaline casts    Imaging:   Ct Abdomen And Pelvis Without Contrast    01/06/2013   IMPRESSION:   1. Small bowel obstruction. The transition point is not clearly  visualized.   2. Bibasilar consolidation may represent atelectasis and/or pneumonia.   3. Cholelithiasis.   4. Nonspecific right anterior thigh subcutaneous emphysema. Recommend  clinical correlation for recent trauma or surgical procedure to  exclude the possibility of infection.   5. Bibasilar pulmonary consolidations may represent pneumonia and/or  atelectasis.  Other entities cannot be definitely excluded. Recommend  further evaluation with chest CT if clinically warranted.   END OF REPORT    * Portable Chest Standard Ap Single View    01/06/2013   IMPRESSION:   Left lower lung field consolidation may represent atelectasis and/or  pneumonia. Recommend PA and lateral views for further evaluation.   END OF REPORT       Lines- date/site:  11/26 Right femoral CVC  11/26 Right femoral arterial line    Active Hospital Medications:   . potassium chloride  20 mEq Intravenous Once   . heparin (porcine)  5,000 Units Subcutaneous Q8H   . piperacillin-tazobactam  2.25 g Intravenous Q8H      . Sterile Water + Sodium Bicarbonate 150 mL/hr (01/07/13 0559)      albuterol     Patient Active Problem List   Diagnosis Code   . AKI (acute kidney injury) 584.9   . Unspecified cerebral artery occlusion with cerebral infarction 434.91   . Coronary artery disease 414.00   . AICD (automatic cardioverter/defibrillator) present V45.02   . Hypertension 401.9   . CVA (cerebral infarction) 434.91   . ASHD (arteriosclerotic heart disease) 414.00   . MI (mitral incompetence) 424.0   . CHF (congestive heart failure) 428.0   . COPD (chronic obstructive pulmonary disease) 496   . ETOH abuse 305.00   . VT (ventricular tachycardia) 427.1   . Anemia of chronic disease 285.29   . Parkinsonism  332.0   . C. difficile colitis 008.45   . Compression fracture of L1 lumbar vertebra 805.4   . Sepsis 038.9, 995.91   . 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 Peripheral vascular disease 443.9   . SBO (small bowel obstruction) 560.9   . Hypotension 458.9   . Acute kidney injury 584.9   . Dehydration 276.51     Assessment: 67 yr old female with extensive past medical history presents with nausea, vomiting, and abd pain. CT abd/pelvis consistent with SBO. Hypotensive requiring several IVF boluses and transferred to the ICU for further care.       PLAN:    Pulm: Hx COPD. Bibasilal consolidations seen on CT but CXR looks pretty clear.    - Her SpO2 is 100% on 2 liters by nasal cannula.   - Will attempt to wean off O2  - No underlying pulmonary issues  - Continue Duo Nebs    CVS:  Hx CHF, VT, ASHD s/p dual chamber ICD, Hx severe PVD s/p amputation with revision  Cadiology was consulted yesterday for an abnormal EKG and concern for AMI. LVEF was found to be 25%. She has has severe coronary disease complicated by chronic heart failure with ischemic cardiomyopathy and ventricular tachycardia.  Will attempt to limit IVF.  At this point, her sBP is in the mid 90s and she is making urine at ~ 60 mL/hr.    - Will stop IVF and assess.  - Hold Coreg until BP stable off IVF  - Resume amiodarone as soon as she is able to take PO  - Hold ASA, lipitor in case she goes to the OR  - Troponins normalizing. Stop checking  - Continue telemetry    Renal/E/F:   Acute on mild CKD. Dehydration, AG acidosis (resolving.)   - Creatinine improving.    - AG closed  - IVF were changed to IV w bicarb.    -  Na and Cl are rising which may be reflective of the 4 liters NS resuscitation.  Rechecking BMP at 10 AM  - Continue foley catheter and measure hourly outputs  - BID BMP   Volume goal for next 24hrs:    GI/Nutrition: Hx left hemicolectomy, admitted with SBO  - NG output over the past  12 hours has been ~ 900 mL.  Continue NG to LIWS  - NPO  - surgical follow up.  For now, continue conservative management  - Change to IV pepcid (home medication)    ID (Include start and stop dates for abx):   On Zosyn/Vanco.    WBC normalizing.   - low threshold to stop antibiotic with Hx of c-diff    Heme:   Anemia of chronic illness  - On B12 po as maintenance medication  - Drop inc Hct from 38-24 likely reflects hemoconcentration initially. The Hct of 24 is closer to her baseline.  She does not appear to have overt Sx of bleeding and BP is improved. However, inconsideration of her Hx of ISHD, will recheck Hct this AM.  Lower threshold to transfuse  -  Daily CBC otherwise     Transfusion? Is Hct <21? no    Endo:   - Hold Caltrate until taking PO  - Hold D3 until taking PO    Neuro:   Hx depression  - Holding sertraline  - Receiving Sinemet 25-100 BID, I'm not sure why? At any rate, it must be held until she takes PO    Pain/agitation/delirium:  - comfortable at rest  - OK for very low dose dilaudid prn    Mobility:  Assess for OOB later if she remains hemodynamically stable     Integument:   Follow skin exam and area of right thigh where the SQ infusion were given    GOALS:  Pulmonary/Ventilator Bundle:  Meet spontaneous breathing trial criteria?  N/A  Decrease FiO2? yes  Decrease PEEP? N/A  HOB at 30 degrees? yes  DVT prophylaxis? N/A If no, document contraindication:  PUD prophylaxis? N/A  Pain/Sedation holiday indicated today? N/A  Did pain/sedation holiday occur yesterday? N/A  Can activity be advanced?  no  Can central line be d/c'd? N/A  Can other catheters/tubes be d/c'ed? no  Can foley be removed?no If not, document indication: Need to document accurate urine outputs in this critically ill patient    Medication reconciliation:    Should any home meds be restarted? no  Should any meds be d/c'ed? no    Are daily labs needed? yes  Are restraints required? no If so, document indication:    Author: Lezlie Lye, NP  as of: 01/07/2013 at: 7:52 AM

## 2013-01-07 NOTE — Procedures (Signed)
Procedure Report    Please enter procedure, pre- and post-procedure diagnoses in the fields above and remove this line of text.    CENTRAL LINE INSERTION NOTE     Time out documentation completed in Procedure navigator prior to procedure:  Yes    *See separate Pulmonary Artery Catheter Insertion Procedure Note if also placed*    Indications  Hemodynamic Monitoring, Administration of Medication and Blood Draws    Procedure Details  1% lidocaine S.Q. 4 mL total    Vein initially entered with:  22G finder needle  Technique:  with wire guide  Correct placement confirmed [id number]  Ectopy: no  Vein cannulated with:   Size: 8.61F   Length(cm): 20   Lumen: quad   Coating: none  Kit Lot #:   Insertion depth: 20 cm  Secured by: suturing and bioocclusive dressing    Successful placement: Yes  Venous Site: femoral  Attempted Sites: Right internal jugular and Right femoral    Complications  none    Condition  Pt tolerated procedure well. Yes  Patient's condition at end of procedure: no change from prior    Plan/Orders  Chest X-Ray: no pneumothorax.  Fluid overload.    EBL  minimal    Disposition      Kayanna Mckillop, PA  01/07/2013  1:16 AM

## 2013-01-07 NOTE — Progress Notes (Addendum)
General Surgery Daily Progress Note      Subjective:    Patient was admitted to ICU from ED, no major overnight issues since transfer to to the ICU, improvement in BP but still with some soft BP, no tachycardia, received total of 5 L fluid since admission, low UOP overnight, this morning denies n/v, no abdominal pain, NG with 500 cc output, no flatus but small BM on arrival to ICU    Objective:    Vital signs:  Filed Vitals:    01/07/13 0300 01/07/13 0400 01/07/13 0500 01/07/13 0559   BP: 102/41 103/41 95/40    Pulse: 60 60 60    Temp:  36.2 C (97.2 F)     TempSrc:  Temporal     Resp: 22 21 24     Height:       Weight:    56.1 kg (123 lb 10.9 oz)   SpO2: 100% 100% 100%        Ins/Outs:    IODETAILS    Intake/Output Summary (Last 24 hours) at 01/07/13 0607  Last data filed at 01/07/13 0459   Gross per 24 hour   Intake 5197.5 ml   Output   1305 ml   Net 3892.5 ml         Physical Exam:    General Appearance: In no apparent distress  Cardiac: RRR, no m/r/g  Respiratory: Unlabored work of breathing   Abdomen: Soft, mildly distended, minimally tender on the right side, no rebound or guarding   Genitourinary: Foley in place  Extremities: warm, well-perfused; no cyanosis, well healed left AKA stump      Assessment/Plan:    67 y.o. female with PMH significant for CAD, CHF, COPD, CVA, VT s/p AICD, anemia, PVD and abdominal PSH consisted of a left hemicolectomy.   Presented to the [facility] ED from nursing home after one week history of nausea and vomiting, on our evaluation, she denies abdominal pain, on exam she is moderately distended and tender mainly on the right side, but no peritoneal sign, CT scan with dilated loop of bowel suggestive for SBO, WBC=15.5, venous lactate of 2 and cr=3.34 ( baseline: 0.99 - 1.12). Patient was admitted to ICU last night      -- No acute surgical intervention is indicated at this point   -- NPO, IVF   -- Continue NG tube to LIWS   -Continue resuscitation, BUN=35 and Cr=3.34 (on admission), AM  labs pending   -- Continue Foley, strict I&O, low UOP overnight, bolus as needed    -- prn IV antiemetic and analgesia   -- Daily electrolyte check and replete as needed   -- We will continue to follow, if she does not improve clinically she may need a surgical intervention, discussion with family about surgical options, she is not interested in surgical options    Reece Agar, MD     01/07/2013     6:07 AM   I saw and evaluated the patient. I agree with the resident's/fellow's findings and plan of care as documented above. Details of my evaluation are as follows: looks much better.  Alert and oriented.  Moved bowels three times.  abd is soft and nontender.  NG with bilious contents.      Improving.  Probably an ileus given the quick resolution.      Roe Rutherford, MD

## 2013-01-07 NOTE — Progress Notes (Signed)
Critical Care Attending Physician Note    SYNOPSIS OF OUR ASSESSMENT AND PLANS:    If there are key historical elements or objective findings that I think deserve particular emphasis, I have recorded them below.  A summary of key elements of our assessment and plans is listed in the NP note. Please refer to NP note for complete details of our mutually agreed upon findings, assessment, and recommendations.    67yo F w/ complex medical hx of COPD, CHF/CAD, VT w/ AICD, DM, AKA now p/w N/V and abdominal pain w/ SBO on Abd CT. A/w hypotension     1. GI - SBO  - Medical management w/ NGT to LIWS  - NPO    2. CV - demand ischemia  - TTE in ED per Cardiology EF 25%, well compensated  - Hypotension improved w/ IVF resuscitation  - Hold coreg and amiodarone, ASA and lipitor    3. Renal -   - Decreased UOP this AM w/ stopping IVF's  - Will administer 25g of 25% albumin  - Repeat BMP this Afternoon    4. Pulmonary - hx of COPD  - on RA  - On duonebs    5. ID -   - D/c Vanco / Zosyn, start Cefepime and Flagyl  - hx of c.diff  - Will follow thigh exam    Vitals  BP 105/41  Pulse 60  Temp(Src) 36.2 C (97.2 F) (Temporal)  Resp 20  Ht 1.6 m (5\' 3" )  Wt 56.1 kg (123 lb 10.9 oz)  BMI 21.91 kg/m2  SpO2 98%  Exam:  NAD, A&Ox3  MMM w/o evidence lesions, no significant cervical LAD  CTA B   RRR w/ no R/M/G  Distended w/ no BS  WWP w/ R thigh crepitus  ______________________________________________________________________  This patient was evaluated on rounds with the resident physician or PA. I personally examined the patient.  All nursing documentation, laboratory data, test results, and radiographs were reviewed and interpreted by me.  I have established the management plan for this patient's critical illness and have been immediately available to assist with patient care.  I agree with the database, findings, and plan of care recorded in the resident physician note.    This patient is critically ill with at least 1 organ  system failure associated with a high probability of imminent or life threatening deterioration. Septic shock, SBO, C.diff colitis, AKI, CHF. The care I have delivered involved high complexity decision making to assess, manipulate, and support vital system function(s), to treat the vital organ system failure and/or prevent further life threatening deterioration of the patient's condition.     Critical care time: I spent 40 minutes personally attending to this patient's critical care needs. This critical care time is exclusive of any time for separately billable procedures    Ethelene Hal MD DTM&H

## 2013-01-07 NOTE — Plan of Care (Signed)
Problem: Inadequate oral intake  Goal: Nutritional status maintained or improved  Intervention: Camera operator (oral supplement)  When diet started rec supplement diet with ensure 1.5 BID  Intervention: Vitamin/mineral supplement  Daily MVI  Intervention: Diet modifications  Goal diet when medically ready - low fiber ( baseline diet at LTC - regular with thin liquids).       Comments:                                                   Initial Nutrition Assessment      Last weight   01/07/13 56.1 kg (123 lb 10.9 oz)       Estimated Nutrient Needs: Estimated Caloric Needs  Total Caloric Estimated Needs: 30 cal/kg- 1700-1850 cal./day  Needs based on (weight): Actual  Estimated Protein Needs  Total Protein Estimated Needs: 63" ( s/p aka) 56.1kg  1.3-1.5 gm pro/kg ( for healing from recent surgery) 70- 85 gm pro/day  Needs based on (weight): Actual      67 yo female who resides at a LTC facility with PMH CAD, CHF, COPD, CVA, , Parkinsons disease, anemia, PVD s/p left AKA (12/04/12) who was admitted with nausea and vomiting CT shows SBO and she is is currently NPO with NGT ( 900 out yesterday ,100 today) and she is in ICU. Pt sleeping soundly info obtained from chart and LTC facility - Pt has lost some wt however is s/p AKA ( 7# over past 1 month) - pt is at risk for becoming malnourished and nutrition plan outlined above to help meet nutrition needs. Will follow need for nutrition support if unable to start oral feeds.  Rudean Hitt , RD, CDN,  CNSC  Pager [phone]

## 2013-01-07 NOTE — Procedures (Signed)
Procedure Report    Please enter procedure, pre- and post-procedure diagnoses in fields above and remove this line of text.    ARTERIAL LINE PROCEDURE NOTE    Time out documentation completed in Procedure navigator prior to procedure:  Yes    Indications  Hypotension.    Procedure Details  Successful placement: Yes  Side: right  Arterial Site: femoral artery  Attempted Sites: Right femoral artery      1% lidocaine S.Q. 3 mL total  Artery cannulated with 20G catheter  Kit Lot #:   Technique: with wire guide  Correct placement confirmed by: transducer  Secured by: suturing and bioocclusive dressing    Findings      Complications  none    Condition  Patient tolerated procedure well. Yes  Patient's condition at end of procedure: no change from prior    Plan/Orders      EBL  minimal    Disposition      Mansi Tokar, PA  01/07/2013  1:15 AM

## 2013-01-07 NOTE — Progress Notes (Signed)
Brief Progress Note:  Staples removed at this time. Patient tolerated this well. Incision is healing well with no signs of infection. Please leave to open air.    Please page Surgery Division 1 with any questions      Sherian Rein, MD  01/07/2013  8:27 AM

## 2013-01-07 NOTE — Plan of Care (Signed)
Problem: Nutrition  Goal: Nutritional status is maintained or improved - Geriatric  Outcome: Goal not met  Pt is currently NPO.  Will continue to monitor and advance diet as ordered.

## 2013-01-08 LAB — PHOSPHORUS: Phosphorus: 3.1 mg/dL (ref 2.7–4.5)

## 2013-01-08 LAB — BASIC METABOLIC PANEL
Anion Gap: 9 (ref 7–16)
Anion Gap: 13 (ref 7–16)
CO2: 21 mmol/L (ref 20–28)
CO2: 16 mmol/L — ABNORMAL LOW (ref 20–28)
Calcium: 8.4 mg/dL — ABNORMAL LOW (ref 8.6–10.2)
Calcium: 8.5 mg/dL — ABNORMAL LOW (ref 8.6–10.2)
Chloride: 118 mmol/L — ABNORMAL HIGH (ref 96–108)
Chloride: 117 mmol/L — ABNORMAL HIGH (ref 96–108)
Creatinine: 1.6 mg/dL — ABNORMAL HIGH (ref 0.51–0.95)
Creatinine: 1.37 mg/dL — ABNORMAL HIGH (ref 0.51–0.95)
GFR,Black: 38 * — AB
GFR,Black: 46 * — AB
GFR,Caucasian: 33 * — AB
GFR,Caucasian: 40 * — AB
Glucose: 75 mg/dL (ref 60–99)
Glucose: 83 mg/dL (ref 60–99)
Lab: 24 mg/dL — ABNORMAL HIGH (ref 6–20)
Lab: 21 mg/dL — ABNORMAL HIGH (ref 6–20)
Potassium: 3.3 mmol/L (ref 3.3–5.1)
Potassium: 4.3 mmol/L (ref 3.3–5.1)
Sodium: 148 mmol/L — ABNORMAL HIGH (ref 133–145)
Sodium: 146 mmol/L — ABNORMAL HIGH (ref 133–145)

## 2013-01-08 LAB — ALBUMIN: Albumin: 2.4 g/dL — ABNORMAL LOW (ref 3.5–5.2)

## 2013-01-08 LAB — CBC AND DIFFERENTIAL
Baso # K/uL: 0 10*3/uL (ref 0.0–0.1)
Basophil %: 0.1 % (ref 0.1–1.2)
Eos # K/uL: 0.1 10*3/uL (ref 0.0–0.4)
Eosinophil %: 0.9 % (ref 0.7–5.8)
Hematocrit: 24 % — ABNORMAL LOW (ref 34–45)
Hemoglobin: 7.4 g/dL — ABNORMAL LOW (ref 11.2–15.7)
Lymph # K/uL: 1.7 10*3/uL (ref 1.2–3.7)
Lymphocyte %: 19.2 % — ABNORMAL LOW (ref 19.3–51.7)
MCV: 98 fL — ABNORMAL HIGH (ref 79–95)
Mono # K/uL: 1.1 10*3/uL — ABNORMAL HIGH (ref 0.2–0.9)
Monocyte %: 12.1 % (ref 4.7–12.5)
Neut # K/uL: 5.8 10*3/uL (ref 1.6–6.1)
Platelets: 197 10*3/uL (ref 160–370)
RBC: 2.5 MIL/uL — ABNORMAL LOW (ref 3.9–5.2)
RDW: 18 % — ABNORMAL HIGH (ref 11.7–14.4)
Seg Neut %: 66.6 % (ref 34.0–71.1)
WBC: 8.7 10*3/uL (ref 4.0–10.0)

## 2013-01-08 LAB — POCT GLUCOSE
Glucose POCT: 65 mg/dL (ref 60–99)
Glucose POCT: 66 mg/dL (ref 60–99)
Glucose POCT: 79 mg/dL (ref 60–99)
Glucose POCT: 72 mg/dL (ref 60–99)
Glucose POCT: 94 mg/dL (ref 60–99)
Glucose POCT: 84 mg/dL (ref 60–99)
Glucose POCT: 72 mg/dL (ref 60–99)
Glucose POCT: 72 mg/dL (ref 60–99)
Glucose POCT: 79 mg/dL (ref 60–99)

## 2013-01-08 LAB — MAGNESIUM: Magnesium: 2.2 mEq/L — ABNORMAL HIGH (ref 1.3–2.1)

## 2013-01-08 MED ORDER — SERTRALINE HCL 50 MG PO TABS *I*
50.0000 mg | ORAL_TABLET | Freq: Every day | ORAL | Status: DC
Start: 2013-01-08 — End: 2013-01-18
  Administered 2013-01-08 – 2013-01-16 (×9): 50 mg via ORAL
  Filled 2013-01-08 (×9): qty 1

## 2013-01-08 MED ORDER — SORBITOL 70 % PO SOLN *WRAPPED*
30.0000 mL | Freq: Every day | ORAL | Status: DC | PRN
Start: 2013-01-08 — End: 2013-01-16
  Filled 2013-01-08: qty 30

## 2013-01-08 MED ORDER — METOPROLOL TARTRATE 1 MG/ML IV SOLN *I*
2.5000 mg | Freq: Four times a day (QID) | INTRAVENOUS | Status: DC
Start: 2013-01-08 — End: 2013-01-08
  Administered 2013-01-08: 2.5 mg via INTRAVENOUS
  Filled 2013-01-08: qty 5

## 2013-01-08 MED ORDER — CARBIDOPA-LEVODOPA CR 25-100 MG PO TBCR *I*
1.0000 | ORAL_TABLET | Freq: Two times a day (BID) | ORAL | Status: DC
Start: 2013-01-08 — End: 2013-01-18
  Administered 2013-01-08 – 2013-01-16 (×16): 1 via ORAL
  Filled 2013-01-08 (×17): qty 1

## 2013-01-08 MED ORDER — D5W & 0.45% NACL IV SOLN *I*
40.0000 mL/h | INTRAVENOUS | Status: DC
Start: 2013-01-08 — End: 2013-01-10
  Administered 2013-01-08: 100 mL/h via INTRAVENOUS
  Administered 2013-01-08 (×2): 40 mL/h via INTRAVENOUS
  Administered 2013-01-08 (×2): 100 mL/h via INTRAVENOUS
  Administered 2013-01-08 (×2): 40 mL/h via INTRAVENOUS
  Administered 2013-01-08: 100 mL/h via INTRAVENOUS
  Administered 2013-01-08 (×2): 40 mL/h via INTRAVENOUS
  Administered 2013-01-08 (×2): 100 mL/h via INTRAVENOUS
  Administered 2013-01-08 (×3): 40 mL/h via INTRAVENOUS
  Administered 2013-01-08: 100 mL/h via INTRAVENOUS
  Administered 2013-01-08 – 2013-01-09 (×2): 40 mL/h via INTRAVENOUS

## 2013-01-08 MED ORDER — DEXTROSE 50 % IV SOLN *I*
INTRAVENOUS | Status: AC
Start: 2013-01-08 — End: 2013-01-08
  Administered 2013-01-08: 25 g via INTRAVENOUS
  Filled 2013-01-08: qty 50

## 2013-01-08 MED ORDER — AMIODARONE HCL 200 MG PO TABS *I*
400.0000 mg | ORAL_TABLET | Freq: Every day | ORAL | Status: DC
Start: 2013-01-09 — End: 2013-01-16
  Administered 2013-01-09 – 2013-01-16 (×8): 400 mg via ORAL
  Filled 2013-01-08 (×8): qty 2

## 2013-01-08 MED ORDER — FAMOTIDINE 20 MG PO TABS *I*
20.0000 mg | ORAL_TABLET | Freq: Two times a day (BID) | ORAL | Status: DC
Start: 2013-01-08 — End: 2013-01-16
  Administered 2013-01-08 – 2013-01-16 (×16): 20 mg via ORAL
  Filled 2013-01-08 (×16): qty 1

## 2013-01-08 MED ORDER — DEXTROSE 50 % IV SOLN *I*
25.0000 g | Freq: Once | INTRAVENOUS | Status: AC
Start: 2013-01-08 — End: 2013-01-08

## 2013-01-08 MED ORDER — CARVEDILOL 3.125 MG PO TABS *I*
3.1250 mg | ORAL_TABLET | Freq: Every day | ORAL | Status: DC
Start: 2013-01-08 — End: 2013-01-16
  Administered 2013-01-08 – 2013-01-15 (×8): 3.125 mg via ORAL
  Filled 2013-01-08 (×9): qty 1

## 2013-01-08 NOTE — Interim Hospital Course Summary (Signed)
Interim Summary for long stay patient    Hospital Problem List:  ACTIVE    Diagnosis Date Noted   . SBO (small bowel obstruction) [560.9] 01/06/2013   . Hypotension [458.9] 01/06/2013   . Acute kidney injury [584.9] 01/06/2013   . Dehydration [276.51] 01/06/2013      RESOLVED    Diagnosis Date Noted Date Resolved   No resolved problems to display.       HospCourse  Michelle [name] is a pleasant 67 yo female with multiple medical problems including COPD, CAD, PVD with left BKA, then AKA, Hx hemicolectomy, and c diff infection 11/2012 who was brought from the [facility] on 11/25 after several days of anorexia, abdominal pain, and lethargy. She was found to be hypotensive with EKG changes.  A Cardiology consult was call and echo done showing LVEF of 25 % with stable ASHD. CT abdomen pelvis showed SBO and an NG tube was placed. She was transferred to the ICU for further management of her hypotension.    A right femoral arterial line and CVC was placed. She received 5 liters of isotonic fluid when her BP stabilized and UO began to improve.  Fluid were stopped several hours later.  She was initially started on Vanco/Zosyn, which was changed to Cefepime/IV flagy for recent Hx of C-diff. The Cefepime was stopped today with anticipation of the flagyl to be stopped in 24-48 hours.     There was little NG output and no nausea or abdominal pain after 36 hours and the NG was removed 11/27 after being connected to gravity for 6 hours. "Essential PO medications such as coreg, Amiodarone, sinemet were restarted. A clear liquid tray was ordered.    She is receiving an IV of D5 0.45% NS @ 40 mL/hr for borderline hypoglycemia while NPO and for mild hypernatremia.   (147). BMP are ordered BID for now.      Do NOT erase these  green markers that allow the Discharge Summary to pull in this Hospital Course data.    First Signed: Lezlie Lye, NP  On: 01/08/2013  at: 2:04 PM

## 2013-01-08 NOTE — Progress Notes (Addendum)
General Surgery Daily Progress Note      Subjective:    No major overnight, afebrile and VSS, no more hypotension episode and good UOP, passing gas frequently and had BM x 4 yesterday, NG with 300 cc output in the past 24 hrs, has no complaint this AM and denies n/v and no abdominal pain    Objective:    Vital signs:  Filed Vitals:    01/08/13 0200 01/08/13 0300 01/08/13 0400 01/08/13 0500   BP: 117/37 115/45 110/39 111/43   Pulse: 64 66 70 67   Temp:   36.4 C (97.5 F)    TempSrc:   Temporal    Resp: 28 18 22 20    Height:       Weight:   54 kg (119 lb 0.8 oz)    SpO2: 100% 100% 99% 99%       Ins/Outs:    IODETAILS    Intake/Output Summary (Last 24 hours) at 01/08/13 0618  Last data filed at 01/08/13 0459   Gross per 24 hour   Intake   1490 ml   Output    880 ml   Net    610 ml         Physical Exam:    General Appearance: In no apparent distress  Cardiac: RRR, no m/r/g  Respiratory: Unlabored work of breathing   Abdomen: Soft, mildly distended, minimally tender on the right side, no rebound or guarding   Genitourinary: Foley in place  Extremities: warm, well-perfused; no cyanosis, well healed left AKA stump      Assessment/Plan:    67 y.o. female with PMH significant for CAD, CHF, COPD, CVA, VT s/p AICD, anemia, PVD and abdominal PSH consisted of a left hemicolectomy.   Presented to the [facility] ED from nursing home after one week history of nausea and vomiting, on our evaluation, she denies abdominal pain, on exam she is moderately distended and tender mainly on the right side, but no peritoneal sign, CT scan with dilated loop of bowel suggestive for SBO, WBC=15.5, venous lactate of 2 and cr=3.34 ( baseline: 0.99 - 1.12). Patient was admitted to ICU last night, her clinical condition improving this was likely an ileus given quick response, NG to be placed to gravity       -- No acute surgical intervention is indicated at this point   -- NPO, IVF   -- Please place NG to gravity, if no n/v NG can be removed in the  afternoon   -  Responded well to IV hydration, now with good UOP, and cr=1.6 from 3.34 (on admission)  -- Continue Foley, strict I&O  -- prn IV antiemetic and analgesia   -- Daily electrolyte check and replete as needed   -- We will continue to follow      Reece Agar, MD     01/08/2013     6:18 AM

## 2013-01-08 NOTE — Progress Notes (Signed)
ICU Daily Progress Note for Inpatients   LOS: 2 days       Interval History:   NG output decreased even after the NG was repositioned  No belly pain this AM, passed several stools  BP, UO improved  Started hypotonic fluid w dextrose for BGs in the 50s and 60s and emerging hyprenatremia    Subjective:   Comfortable    Objective Section:  Temp:  [36.4 C (97.5 F)-36.6 C (97.9 F)] 36.4 C (97.5 F)  Heart Rate:  [60-78] 72  Resp:  [16-30] 24  BP: (95-117)/(31-76) 112/46 mmHg     Intake/Output Summary (Last 24 hours) at 01/08/13 0813  Last data filed at 01/08/13 0659   Gross per 24 hour   Intake   1340 ml   Output    865 ml   Net    475 ml       Vent Settings:       Date Mechanical ventilation commenced:  n/a    CVP:  n/a     Last BM:  11/26    Current/admission weight:  54 Kg / 56 Kg    Physical Exam:  Gen: Awake, alert, oriented, comfortable  HEENT: mmm moist, neck supple  Pulm: diminished at the bases but otherwise clear  Cor: RRR, mild sinus arrythmia  Abd: non tender, hypoactive BS throughout. Minimal yellow NG aspirate  Ext: No peripheral edema. Right thigh w/o crepitus. Left LE AKA incision clean and dry. Staples removed yesterday  Neuro: MAE, parkinsonian tremor of the head, otherwise, no rigidity  Integument: Surgical wound as above    Lab Results:    Recent Labs  Lab 01/08/13  0436 01/07/13  0927 01/07/13  0559 01/07/13  0140 01/06/13  1515   WBC 8.7  --  8.0  --  15.5*   Hemoglobin 7.4*  --  7.3* 8.4* 11.0*   Hematocrit 24* 24* 24*  --  35   Platelets 197  --  193  --  317       Recent Labs  Lab 01/08/13  0436 01/07/13  1646 01/07/13  0927 01/07/13  0559   Sodium 148* 148* 147* 146*   Potassium 3.3 3.7 4.2 3.6   Chloride 118* 116* 115* 115*   CO2 21 19* 21 19*   UN 24* 25* 25* 26*   Creatinine 1.60* 1.85* 2.15* 2.28*   Glucose 75 72 77 82   Magnesium 2.2*  --   --  1.3   Phosphorus 3.1  --   --  5.1*       Recent Labs  Lab 01/06/13  1515   Protime 11.5       Recent Labs  Lab 01/07/13  0140 01/06/13  1515    Troponin T <0.01 0.03*       Recent Labs  Lab 01/07/13  0559 01/06/13  1515   Total Protein 5.0* 8.0*       Recent Labs  Lab 01/07/13  0140 01/06/13  1515   Lactate ART,WB 0.8  --    Lactate VEN,WB  --  2.0        Micro:  11/21 Urine clean catch >100K yeast  11/25 Blood Cx x 2: NGTD  11/25 U/A 2+ LE, neg nitrate, 6-10 WBC, 3-10 hyaline casts    Imaging:   Ct Abdomen And Pelvis Without Contrast    01/06/2013   IMPRESSION:   1. Small bowel obstruction. The transition point is not clearly  visualized.  2. Bibasilar consolidation may represent atelectasis and/or pneumonia.   3. Cholelithiasis.   4. Nonspecific right anterior thigh subcutaneous emphysema. Recommend  clinical correlation for recent trauma or surgical procedure to  exclude the possibility of infection.   5. Bibasilar pulmonary consolidations may represent pneumonia and/or  atelectasis. Other entities cannot be definitely excluded. Recommend  further evaluation with chest CT if clinically warranted.   END OF REPORT    * Portable Chest Standard Ap Single View    01/07/2013   IMPRESSION:   No evidence of pneumothorax status post attempted line placement with  line is seen in the right hemithorax likely related to skin fold.   Bibasilar dependent consolidation likely related to atelectasis  versus pneumonia.   Mild interstitial prominence which could relate to atelectasis or  pulmonary edema.   END OF REPORT    * Portable Chest Standard Ap Single View    01/06/2013   IMPRESSION:   Left lower lung field consolidation may represent atelectasis and/or  pneumonia. Recommend PA and lateral views for further evaluation.   END OF REPORT       Lines- date/site:  11/26 Right femoral CVC  11/26 Right femoral arterial line    Active Hospital Medications:   . famotidine  20 mg Intravenous Daily   . ceFEPime (MAXIPIME) IV  2 g Intravenous Daily   . metroNIDAZOLE  500 mg Intravenous Q8H   . heparin (porcine)  5,000 Units Subcutaneous Q8H      . dextrose 5 % and 0.45%  NaCl 100 mL/hr (01/08/13 1610)      albuterol     Patient Active Problem List   Diagnosis Code   . AKI (acute kidney injury) 584.9   . Unspecified cerebral artery occlusion with cerebral infarction 434.91   . Coronary artery disease 414.00   . AICD (automatic cardioverter/defibrillator) present V45.02   . Hypertension 401.9   . CVA (cerebral infarction) 434.91   . ASHD (arteriosclerotic heart disease) 414.00   . MI (mitral incompetence) 424.0   . CHF (congestive heart failure) 428.0   . COPD (chronic obstructive pulmonary disease) 496   . ETOH abuse 305.00   . VT (ventricular tachycardia) 427.1   . Anemia of chronic disease 285.29   . Parkinsonism 332.0   . C. difficile colitis 008.45   . Compression fracture of L1 lumbar vertebra 805.4   . Sepsis 038.9, 995.91   . 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 Peripheral vascular disease 443.9   . SBO (small bowel obstruction) 560.9   . Hypotension 458.9   . Acute kidney injury 584.9   . Dehydration 276.51     Assessment: 67 yr old female with extensive past medical history presents with nausea, vomiting, and abd pain. CT abd/pelvis consistent with SBO. Hypotensive requiring several IVF boluses and transferred to the ICU for further care.       PLAN:    Pulm: Hx COPD. Bibasilal consolidations seen on CT but CXR looks pretty clear.    - Oxygenating well on room air  - Continue Duo Nebs    CVS:  Hx CHF, VT, ASHD s/p dual chamber ICD, Hx severe PVD s/p amputation with revision  Cadiology was consulted for an abnormal EKG and concern for AMI. LVEF was found to be 25%. She has has severe coronary disease complicated by chronic heart failure with ischemic cardiomyopathy and ventricular tachycardia.    -  Continue to to limit IVF.    - Hold Coreg taking PO. Start low dose IV lopressor in the meantime  - Resume amiodarone as soon as she is able to take PO  - Hold ASA, lipitor until taking PO. AT this point, surgical  intervention appears less and less likely.  - Continue telemetry. Can be off tele if transferred to the floor    Renal/E/F:   Acute on mild CKD. Dehydration, AG acidosis (resolving.)   - Creatinine 1.6.    - AG closed  Mild hypernatremia  - free water deficit is ~ 1.6 liters. Will plan to correct with a little more than half, at 40 mL/hr (D5 .45% NS).    - Continue foley catheter and measure hourly outputs  - BID BMP   Volume goal for next 24hrs:    GI/Nutrition: Hx left hemicolectomy, admitted with SBO  - NG output over the past 12 hours has been ~ 300 mL.    - NG to gravity and if no nausea, will remove NG tube and restart PO meds and some clear liquids  - NPO  - surgical follow up.  For now, continue conservative management  - Changed to IV pepcid (home medication)    ID (Include start and stop dates for abx):   Antibiotics change to Cefepime/Flagyl for recent Hx cdiff..    - WBC normal, afebrile  - Stop cefepime today. Refer to Dr Darlina Sicilian note for directives on Flagyl    Heme:   Anemia of chronic illness  - On B12 po as maintenance medication  - Drop inc Hct from 38-24 likely reflects hemoconcentration initially. The Hct of 24 is closer to her baseline.  She does not appear to have overt Sx of bleeding and BP is stable.  -  Daily CBC    Transfusion? Is Hct <21? no    Endo:   - Hold Caltrate until taking PO  - Hold D3 until taking PO  - BG q 6 hour    Neuro:   Hx depression  - Holding sertraline  - Receiving Sinemet 25-100 BID, for parkinsonian tremor, but PD not officially diagnosed. Resume when taking PO    Pain/agitation/delirium:  - comfortable at rest  - OK for very low dose dilaudid prn    Mobility:  - OOB to chair     Integument:   Follow skin exam and area of right thigh where the SQ infusion were given  - Stable    GOALS:  Pulmonary/Ventilator Bundle:  Meet spontaneous breathing trial criteria?  N/A  Decrease FiO2? yes  Decrease PEEP? N/A  HOB at 30 degrees? yes  DVT prophylaxis? N/A If no, document  contraindication:  PUD prophylaxis? N/A  Pain/Sedation holiday indicated today? N/A  Did pain/sedation holiday occur yesterday? N/A  Can activity be advanced?  no  Can central line be d/c'd? N/A  Can other catheters/tubes be d/c'ed? no  Can foley be removed?no If not, document indication: Need to document accurate urine outputs in this critically ill patient    Medication reconciliation:    Should any home meds be restarted? no  Should any meds be d/c'ed? no    Are daily labs needed? yes  Are restraints required? no If so, document indication:    Author: Lezlie Lye, NP  as of: 01/08/2013 at: 8:13 AM

## 2013-01-08 NOTE — Progress Notes (Signed)
ICU NP Addendum to daily Progress Note:    The NG tube did not have any further drainage to gravity.  Her abdomen is slightly softly distended but non tender with hypoactive BS. She denies nausea.  NG tube removed without difficulty.  Will resume "essential" PO medications and start clear liquids.    I explained the procedure to the patient before proceeding.  The dressing from the right femoral venous and arterial catheters was removed.  3 sutures were clipped and both lines were removed without resistance or incident.  Good hemostasis was obtained.  I held manual pressure for 15 minutes.  A dry dressing was appplied which can be removed later today per patient preference.     Lezlie Lye NP,  pager [phone]

## 2013-01-08 NOTE — Progress Notes (Signed)
Critical Care Attending Physician Note    SYNOPSIS OF OUR ASSESSMENT AND PLANS:    If there are key historical elements or objective findings that I think deserve particular emphasis, I have recorded them below.  A summary of key elements of our assessment and plans is listed in the NP note. Please refer to NP note for complete details of our mutually agreed upon findings, assessment, and recommendations.    67yo F w/ complex medical hx of COPD, CHF/CAD, VT w/ AICD, DM, AKA now p/w N/V and abdominal pain w/ SBO on Abd CT. A/w hypotension     ON: Decreasing NGT output this AM    1. GI - SBO  - Medical management w/ NGT to LIWS, decreasing this AM and to gravity now, may remove later  - NPO    2. CV - demand ischemia  - TTE in ED per Cardiology EF 25%, well compensated  - Hypotension improved w/ IVF resuscitation  - Hold coreg and amiodarone, ASA and lipitor, start low dose metoprolol    3. Renal -   - D51/2NS at 40cc/hr  - Excellen UOP with improved Cr will follow    4. Pulmonary - hx of COPD  - on RA  - On duonebs    5. ID -   - On Cefepime and Flagyl  - hx of c.diff  - Will follow thigh exam    Vitals  BP 119/47  Pulse 75  Temp(Src) 36.2 C (97.2 F) (Temporal)  Resp 20  Ht 1.6 m (5\' 3" )  Wt 54 kg (119 lb 0.8 oz)  BMI 21.09 kg/m2  SpO2 99%  Exam:  NAD, A&Ox3  MMM w/o evidence lesions, no significant cervical LAD  CTA B   RRR w/ no R/M/G  Non-tender w/o BS  WWP w/ R thigh crepitus  ______________________________________________________________________  This patient was evaluated on rounds with the resident physician or PA. I personally examined the patient.  All nursing documentation, laboratory data, test results, and radiographs were reviewed and interpreted by me.  I have established the management plan for this patient's critical illness and have been immediately available to assist with patient care.  I agree with the database, findings, and plan of care recorded in the resident physician  note.    Ethelene Hal MD DTM&H

## 2013-01-08 NOTE — Progress Notes (Addendum)
General Daily SOAP Progress Note for Inpatients   LOS: 2 days     Subjective: No acute issues O/N. BP stable with no further IVF boluses required and Cr continues to improve (1.80 from 1.85) with good UOP. Pt denies abdominal pain, associated nausea or emesis. Pt had 4x BMs yesterday. Hct stable at 24, afebrile and WBC wnl (8.7).     Vitals: Blood pressure 112/46, pulse 72, temperature 36.4 C (97.5 F), temperature source Temporal, resp. rate 24, height 1.6 m (5\' 3" ), weight 54 kg (119 lb 0.8 oz), SpO2 91.00%.   Vital-Signs Ranges: Temp:  [36.4 C (97.5 F)-36.6 C (97.9 F)] 36.4 C (97.5 F)  Heart Rate:  [60-78] 72  Resp:  [16-30] 24  BP: (95-117)/(31-76) 112/46 mmHg    O2 Device: Nasal cannula (01/08/13 0700)  O2 Flow Rate: 2 L/min (01/08/13 0700)    Intake/Output last 3 shifts:  I/O last 3 completed shifts:  11/26 0700 - 11/27 0659  In: 1744.6 (32.3 mL/kg) [I.V.:1214.6 (0.9 mL/kg/hr); NG/GT:30; IV Piggyback:500]  Out: 1155 (21.4 mL/kg) [Urine:855 (0.7 mL/kg/hr); Emesis/NG output:300]  Net: 589.6  Weight used: 54 kg  Intake/Output this shift:       Nursing pain score: Last Nursing documented pain:  0-10 Scale: 0 (01/08/13 0600)    Objective  NAD, NGT in place to LIWS (300cc / 24 hrs)  RRR  CTA BL  abd soft, mildly distended, lower ML scar well-healed, NT, BS present  L AKA site healing well with no erythema or drainage, R groin with central line in place, R thigh without crepitus or erythema, RLE [facility]    Lab Results:   All labs in the last 24 hours:   Recent Results (from the past 24 hour(s))   BASIC METABOLIC PANEL    Collection Time     01/07/13  9:27 AM       Result Value Range    Glucose 77  60 - 99 mg/dL    Sodium 161 (*) 096 - 145 mmol/L    Potassium 4.2  3.3 - 5.1 mmol/L    Chloride 115 (*) 96 - 108 mmol/L    CO2 21  20 - 28 mmol/L    Anion Gap 11  7 - 16    UN 25 (*) 6 - 20 mg/dL    Creatinine 0.45 (*) 0.51 - 0.95 mg/dL    GFR,Caucasian 23 (*)     GFR,Black 27 (*)     Calcium 7.0 (*) 8.6 - 10.2 mg/dL    HEMATOCRIT    Collection Time     01/07/13  9:27 AM       Result Value Range    Hematocrit 24 (*) 34 - 45 %   POCT GLUCOSE    Collection Time     01/07/13 12:29 PM       Result Value Range    Glucose POCT 69  60 - 99 mg/dL   BASIC METABOLIC PANEL    Collection Time     01/07/13  4:46 PM       Result Value Range    Glucose 72  60 - 99 mg/dL    Sodium 409 (*) 811 - 145 mmol/L    Potassium 3.7  3.3 - 5.1 mmol/L    Chloride 116 (*) 96 - 108 mmol/L    CO2 19 (*) 20 - 28 mmol/L    Anion Gap 13  7 - 16    UN 25 (*) 6 - 20 mg/dL  Creatinine 1.85 (*) 0.51 - 0.95 mg/dL    GFR,Caucasian 28 (*)     GFR,Black 32 (*)     Calcium 7.5 (*) 8.6 - 10.2 mg/dL   POCT GLUCOSE    Collection Time     01/08/13  1:15 AM       Result Value Range    Glucose POCT 65  60 - 99 mg/dL   POCT GLUCOSE    Collection Time     01/08/13  1:18 AM       Result Value Range    Glucose POCT 66  60 - 99 mg/dL   CBC AND DIFFERENTIAL    Collection Time     01/08/13  4:36 AM       Result Value Range    WBC 8.7  4.0 - 10.0 THOU/uL    RBC 2.5 (*) 3.9 - 5.2 MIL/uL    Hemoglobin 7.4 (*) 11.2 - 15.7 g/dL    Hematocrit 24 (*) 34 - 45 %    MCV 98 (*) 79 - 95 fL    RDW 18.0 (*) 11.7 - 14.4 %    Platelets 197  160 - 370 THOU/uL    Seg Neut % 66.6  34.0 - 71.1 %    Lymphocyte % 19.2 (*) 19.3 - 51.7 %    Monocyte % 12.1  4.7 - 12.5 %    Eosinophil % 0.9  0.7 - 5.8 %    Basophil % 0.1  0.1 - 1.2 %    Neut # K/uL 5.8  1.6 - 6.1 THOU/uL    Lymph # K/uL 1.7  1.2 - 3.7 THOU/uL    Mono # K/uL 1.1 (*) 0.2 - 0.9 THOU/uL    Eos # K/uL 0.1  0.0 - 0.4 THOU/uL    Baso # K/uL 0.0  0.0 - 0.1 THOU/uL   MAGNESIUM    Collection Time     01/08/13  4:36 AM       Result Value Range    Magnesium 2.2 (*) 1.3 - 2.1 mEq/L   PHOSPHORUS    Collection Time     01/08/13  4:36 AM       Result Value Range    Phosphorus 3.1  2.7 - 4.5 mg/dL   BASIC METABOLIC PANEL    Collection Time     01/08/13  4:36 AM       Result Value Range    Glucose 75  60 - 99 mg/dL    Sodium 161 (*) 096 - 145 mmol/L     Potassium 3.3  3.3 - 5.1 mmol/L    Chloride 118 (*) 96 - 108 mmol/L    CO2 21  20 - 28 mmol/L    Anion Gap 9  7 - 16    UN 24 (*) 6 - 20 mg/dL    Creatinine 0.45 (*) 0.51 - 0.95 mg/dL    GFR,Caucasian 33 (*)     GFR,Black 38 (*)     Calcium 8.4 (*) 8.6 - 10.2 mg/dL   ALBUMIN    Collection Time     01/08/13  4:36 AM       Result Value Range    Albumin 2.4 (*) 3.5 - 5.2 g/dL   POCT GLUCOSE    Collection Time     01/08/13  5:42 AM       Result Value Range    Glucose POCT 72  60 - 99 mg/dL       Radiology impressions (  last 3 days):  Ct Abdomen And Pelvis Without Contrast    01/06/2013   IMPRESSION:   1. Small bowel obstruction. The transition point is not clearly  visualized.   2. Bibasilar consolidation may represent atelectasis and/or pneumonia.   3. Cholelithiasis.   4. Nonspecific right anterior thigh subcutaneous emphysema. Recommend  clinical correlation for recent trauma or surgical procedure to  exclude the possibility of infection.   5. Bibasilar pulmonary consolidations may represent pneumonia and/or  atelectasis. Other entities cannot be definitely excluded. Recommend  further evaluation with chest CT if clinically warranted.   END OF REPORT    * Portable Chest Standard Ap Single View    01/07/2013   IMPRESSION:   No evidence of pneumothorax status post attempted line placement with  line is seen in the right hemithorax likely related to skin fold.   Bibasilar dependent consolidation likely related to atelectasis  versus pneumonia.   Mild interstitial prominence which could relate to atelectasis or  pulmonary edema.   END OF REPORT    * Portable Chest Standard Ap Single View    01/06/2013   IMPRESSION:   Left lower lung field consolidation may represent atelectasis and/or  pneumonia. Recommend PA and lateral views for further evaluation.   END OF REPORT      Currently Active/Followed Hospital Problems:  Active Hospital Problems    Diagnosis   . SBO (small bowel obstruction)   . Hypotension   . Acute kidney  injury   . 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  - no acute surgical intervention indicated for LE as R groin without crepitus or erythema suggestive of necrotizing fascitis  - may leave L AKA site open to air   - SBO management per Surgery team 2  - cont current care per ICU   - please page surgery division 1 pager for any new questions, issues or concerns     Author: Wilhemina Cash, MD  as of: 01/08/2013  at: 8:25 AM.    I saw and evaluated the patient. I agree with the resident's/fellow's findings and plan of care as documented above.    Vincent Gros, MD

## 2013-01-08 NOTE — Plan of Care (Signed)
Problem: Safety  Goal: Patient will remain free of falls  Outcome: Maintaining  Pt. remained free from falls during shift. Observed > Q1 hour. Side rails raised x 4.        Problem: Pain/Comfort  Goal: Patient's pain or discomfort is manageable  Outcome: Maintaining  Patient denies and Pain, discomfort or nausea.     Comments:   Will continue to monitor.    Festus Barren, RN

## 2013-01-09 LAB — POCT GLUCOSE
Glucose POCT: 81 mg/dL (ref 60–99)
Glucose POCT: 90 mg/dL (ref 60–99)
Glucose POCT: 107 mg/dL — ABNORMAL HIGH (ref 60–99)

## 2013-01-09 LAB — CBC AND DIFFERENTIAL
Baso # K/uL: 0 10*3/uL (ref 0.0–0.1)
Basophil %: 0.1 % (ref 0.1–1.2)
Eos # K/uL: 0.2 10*3/uL (ref 0.0–0.4)
Eosinophil %: 1.8 % (ref 0.7–5.8)
Hematocrit: 27 % — ABNORMAL LOW (ref 34–45)
Hemoglobin: 8.2 g/dL — ABNORMAL LOW (ref 11.2–15.7)
Lymph # K/uL: 1.6 10*3/uL (ref 1.2–3.7)
Lymphocyte %: 16.2 % — ABNORMAL LOW (ref 19.3–51.7)
MCV: 98 fL — ABNORMAL HIGH (ref 79–95)
Mono # K/uL: 1.2 10*3/uL — ABNORMAL HIGH (ref 0.2–0.9)
Monocyte %: 12 % (ref 4.7–12.5)
Neut # K/uL: 6.8 10*3/uL — ABNORMAL HIGH (ref 1.6–6.1)
Platelets: 213 10*3/uL (ref 160–370)
RBC: 2.7 MIL/uL — ABNORMAL LOW (ref 3.9–5.2)
RDW: 17.9 % — ABNORMAL HIGH (ref 11.7–14.4)
Seg Neut %: 68.9 % (ref 34.0–71.1)
WBC: 9.9 10*3/uL (ref 4.0–10.0)

## 2013-01-09 LAB — BASIC METABOLIC PANEL
Anion Gap: 10 (ref 7–16)
Anion Gap: 10 (ref 7–16)
CO2: 23 mmol/L (ref 20–28)
CO2: 22 mmol/L (ref 20–28)
Calcium: 8.4 mg/dL — ABNORMAL LOW (ref 8.6–10.2)
Calcium: 8.7 mg/dL (ref 8.6–10.2)
Chloride: 113 mmol/L — ABNORMAL HIGH (ref 96–108)
Chloride: 113 mmol/L — ABNORMAL HIGH (ref 96–108)
Creatinine: 1.26 mg/dL — ABNORMAL HIGH (ref 0.51–0.95)
Creatinine: 1.16 mg/dL — ABNORMAL HIGH (ref 0.51–0.95)
GFR,Black: 51 * — AB
GFR,Black: 56 * — AB
GFR,Caucasian: 44 * — AB
GFR,Caucasian: 49 * — AB
Glucose: 80 mg/dL (ref 60–99)
Glucose: 98 mg/dL (ref 60–99)
Lab: 19 mg/dL (ref 6–20)
Lab: 17 mg/dL (ref 6–20)
Potassium: 3.8 mmol/L (ref 3.3–5.1)
Potassium: 4 mmol/L (ref 3.3–5.1)
Sodium: 146 mmol/L — ABNORMAL HIGH (ref 133–145)
Sodium: 145 mmol/L (ref 133–145)

## 2013-01-09 LAB — MAGNESIUM: Magnesium: 1.7 mEq/L (ref 1.3–2.1)

## 2013-01-09 LAB — PHOSPHORUS: Phosphorus: 1.9 mg/dL — ABNORMAL LOW (ref 2.7–4.5)

## 2013-01-09 LAB — ALBUMIN: Albumin: 2.7 g/dL — ABNORMAL LOW (ref 3.5–5.2)

## 2013-01-09 MED ORDER — ONDANSETRON HCL 2 MG/ML IV SOLN *I*
4.0000 mg | Freq: Two times a day (BID) | INTRAMUSCULAR | Status: DC | PRN
Start: 2013-01-09 — End: 2013-01-10
  Administered 2013-01-09 – 2013-01-10 (×2): 4 mg via INTRAVENOUS
  Filled 2013-01-09 (×2): qty 2

## 2013-01-09 NOTE — Progress Notes (Addendum)
General Surgery Daily Progress Note      Subjective:    No major overnight, afebrile and VSS, was transferred from ICU yesterday, tolerated CLD, had BM while in ICU but no more BM since transfer to the floor, denies n/v    Objective:    Vital signs:  Filed Vitals:    01/08/13 1829 01/08/13 2114 01/09/13 0208 01/09/13 0457   BP: 120/54 112/50 118/64 124/64   Pulse: 67 65 68 70   Temp: 36.5 C (97.7 F) 36.9 C (98.4 F) 36.5 C (97.7 F) 36.7 C (98.1 F)   TempSrc: Temporal Temporal Temporal Temporal   Resp: 16 18 18 20    Height:       Weight:       SpO2: 100% 92% 99% 96%       Ins/Outs:    IODETAILS    Intake/Output Summary (Last 24 hours) at 01/09/13 0618  Last data filed at 01/09/13 0610   Gross per 24 hour   Intake 1931.33 ml   Output    950 ml   Net 981.33 ml         Physical Exam:    General Appearance: In no apparent distress  Cardiac: RRR, no m/r/g  Respiratory: Unlabored work of breathing   Abdomen: Soft, mildly distended, minimally tender on the right side, no rebound or guarding   Genitourinary: Foley in place  Extremities: warm, well-perfused; no cyanosis, well healed left AKA stump      Assessment/Plan:    66 y.o. female with PMH significant for CAD, CHF, COPD, CVA, VT s/p AICD, anemia, PVD and abdominal PSH consisted of a left hemicolectomy.   [facility] ED from nursing home after one week history of nausea and vomiting, on our evaluation, she denies abdominal pain, on exam she is moderately distended and tender mainly on the right side, but no peritoneal sign, CT scan with dilated loop of bowel suggestive for SBO, WBC=15.5, venous lactate of 2 and cr=3.34 ( baseline: 0.99 - 1.12). Patient was admitted to ICU last night, her clinical condition improving and has return of bowel function, this was likely an ileus given quick response, NG is now out, tolerating CLD       -- No acute surgical intervention is indicated at this point   -- Tolerating CLD, advance as tolerated   -   Responded well to  IV hydration, now with good UOP, Cr continues to improve (1.26)  -- Strict I&O  -- prn IV antiemetic and analgesia   -- Daily electrolyte check and replete as needed   -- Patient now with return of bowel function and able to tolerate diet, we will sign off, please call us with any questions or concerns      Reece Agar, MD     01/09/2013     6:18 AM

## 2013-01-09 NOTE — Progress Notes (Signed)
Inpatient Geriatrics Progress Note (APSO) - LOS: 3    1. SBO - NGT out and tolerating po. Symptomatically improved.   2. Abx discontinued given no indication at this point (including flagyl)  3. Former cdiff sp full course of abx  4. SQ air due to clysis in right groin/thigh - no infection  5. Sp recent left aka   6. Acute kidney injury is improving     Active Hospital Problems    Diagnosis   . SBO (small bowel obstruction)   . Hypotension   . Acute kidney injury   . Dehydration      Resolved Hospital Problems    Diagnosis   No resolved problems to display.       Medications brief:  Scheduled Meds:  . carvedilol  3.125 mg Oral Daily   . famotidine  20 mg Oral Q12H SCH   . carbidopa-levodopa ER  1 tablet Oral Q12H SCH   . amiodarone  400 mg Oral Daily   . sertraline  50 mg Oral Daily   . heparin (porcine)  5,000 Units Subcutaneous Q8H     Continuous Infusions:  . dextrose 5 % and 0.45% NaCl 40 mL/hr (01/08/13 1659)     PRN Meds:.sorbitol, albuterol    Medications given (24h):  amiodarone (PACERONE) tablet 400 mg    Date Action Dose Route User    01/09/2013 0852 Given 400 mg Oral Lavena Bullion, RN      carbidopa-levodopa ER (SINEMET CR) 25-100 MG per tablet 1 tablet    Date Action Dose Route User    01/09/2013 0853 Given 1 tablet Oral Lavena Bullion, RN    01/08/2013 1831 Given 1 tablet Oral Lonna Cobb, RN      carvedilol (COREG) tablet 3.125 mg    Date Action Dose Route User    01/08/2013 1831 Given 3.125 mg Oral Lonna Cobb, RN      Dextrose 5 %- Sodium Chloride 0.45%    Date Action Dose Route User    01/08/2013 1659 Rate/Dose Verify 40 mL/hr Intravenous Jonah Blue, RN    01/08/2013 1559 Rate/Dose Verify 40 mL/hr Intravenous Jonah Blue, RN    01/08/2013 1459 Rate/Dose Verify 40 mL/hr Intravenous Jonah Blue, RN    01/08/2013 1359 Rate/Dose Verify 40 mL/hr Intravenous Jonah Blue, RN    01/08/2013 1259 Rate/Dose Verify 40 mL/hr Intravenous Jonah Blue, RN      famotidine  (PEPCID) tablet 20 mg    Date Action Dose Route User    01/09/2013 0852 Given 20 mg Oral Lavena Bullion, RN    01/08/2013 2054 Given 20 mg Oral Lonna Cobb, RN      heparin (porcine) SQ injection 5,000 Units    Date Action Dose Route User    01/09/2013 0606 Given 5000 Units Subcutaneous Germaine Pomfret, RN    01/08/2013 2055 Given 5000 Units Subcutaneous Lonna Cobb, RN    01/08/2013 1440 Given 5000 Units Subcutaneous (Abdominal Tissue) Jonah Blue, RN      metroNIDAZOLE (FLAGYL) IVPB 500 mg    Date Action Dose Route User    01/09/2013 0450 New Bag 500 mg Intravenous Germaine Pomfret, RN    01/08/2013 1919 New Bag 500 mg Intravenous Lonna Cobb, RN      sertraline (ZOLOFT) tablet 50 mg    Date Action Dose Route User    01/09/2013 0852 Given 50 mg Oral Lavena Bullion, RN    01/08/2013 1831 Given  50 mg Oral Doell, Lyn Records, RN          Vital Signs Ranges:   Temp:  [36.5 C (97.7 F)-36.9 C (98.4 F)] 36.8 C (98.2 F)  Heart Rate:  [65-70] 68  Resp:  [16-23] 18  BP: (109-124)/(46-67) 110/48 mmHg  FiO2:  [2 %] 2 %   Patient denies nausea/vomiting/abd pain. Her abdominal exam is without tenderness today.  Phylliss Blakes, MD   01/09/2013   11:42 AM

## 2013-01-09 NOTE — Progress Notes (Signed)
Nutrition f/u: Note pt with return of bowel function. Current diet order: clear liquids. Suggest advance to goal diet: low fiber with Ensure 1.5 TID when appropriate.   Service to follow.     Vira Blanco, RD  Nutrition Services  Pager [phone] phone [phone]

## 2013-01-09 NOTE — Interdisciplinary Rounds (Signed)
Interdisciplinary Rounds Note    Date: 01/09/2013   Time: 12:37 PM   Attendance:  Care Coordinator and Social Worker    Admit Date/Time:  01/06/2013  6:37 PM    Principal Problem: SBO (small bowel obstruction)  Problem List:   Patient Active Problem List    Diagnosis Date Noted   . SBO (small bowel obstruction) 01/06/2013   . Hypotension 01/06/2013   . Acute kidney injury 01/06/2013   . Dehydration 01/06/2013   . Peripheral vascular disease      Left AKA     . 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 Sepsis 12/03/2012   . 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 C. difficile colitis 07/25/2012   . Compression fracture of L1 lumbar vertebra 07/25/2012   . AKI (acute kidney injury) 07/15/2012   . Unspecified cerebral artery occlusion with cerebral infarction    . Coronary artery disease    . AICD (automatic cardioverter/defibrillator) present    . Hypertension    . Parkinsonism    . CVA (cerebral infarction) 10/23/2011   . ASHD (arteriosclerotic heart disease) 10/23/2011   . MI (mitral incompetence) 10/23/2011   . CHF (congestive heart failure) 10/23/2011   . COPD (chronic obstructive pulmonary disease) 10/23/2011   . ETOH abuse 10/23/2011   . VT (ventricular tachycardia) 10/23/2011     S/p AICD     . Anemia of chronic disease 10/23/2011       The patient's problem list and interdisciplinary care plan was reviewed.    Discharge Planning  Lives in: SNF              *Does patient currently have home care services?: No     *Current External Services: None                      Plan    Anticipated Discharge Date:     Discharge Disposition: SNF Long Term Care

## 2013-01-09 NOTE — Plan of Care (Signed)
Pain/Comfort    . Patient's pain or discomfort is manageable Maintaining    Denies pain    Psychosocial    . Demonstrates ability to cope with illness Maintaining        Safety    . Patient will remain free of falls Maintaining        Free from falls. Bed alarm on. Call bell within reach.

## 2013-01-10 LAB — BASIC METABOLIC PANEL
Anion Gap: 11 (ref 7–16)
Anion Gap: 11 (ref 7–16)
CO2: 21 mmol/L (ref 20–28)
CO2: 20 mmol/L (ref 20–28)
Calcium: 8.6 mg/dL (ref 8.6–10.2)
Calcium: 9 mg/dL (ref 8.6–10.2)
Chloride: 110 mmol/L — ABNORMAL HIGH (ref 96–108)
Chloride: 109 mmol/L — ABNORMAL HIGH (ref 96–108)
Creatinine: 1.02 mg/dL — ABNORMAL HIGH (ref 0.51–0.95)
Creatinine: 1.04 mg/dL — ABNORMAL HIGH (ref 0.51–0.95)
GFR,Black: 65 *
GFR,Black: 64 *
GFR,Caucasian: 57 * — AB
GFR,Caucasian: 55 * — AB
Glucose: 98 mg/dL (ref 60–99)
Glucose: 108 mg/dL — ABNORMAL HIGH (ref 60–99)
Lab: 15 mg/dL (ref 6–20)
Lab: 13 mg/dL (ref 6–20)
Potassium: 3.9 mmol/L (ref 3.3–5.1)
Potassium: 3.4 mmol/L (ref 3.3–5.1)
Sodium: 142 mmol/L (ref 133–145)
Sodium: 140 mmol/L (ref 133–145)

## 2013-01-10 LAB — CBC AND DIFFERENTIAL
Baso # K/uL: 0 10*3/uL (ref 0.0–0.1)
Basophil %: 0.1 % (ref 0.1–1.2)
Eos # K/uL: 0.1 10*3/uL (ref 0.0–0.4)
Eosinophil %: 0.8 % (ref 0.7–5.8)
Hematocrit: 26 % — ABNORMAL LOW (ref 34–45)
Hemoglobin: 8.3 g/dL — ABNORMAL LOW (ref 11.2–15.7)
Lymph # K/uL: 2 10*3/uL (ref 1.2–3.7)
Lymphocyte %: 21.8 % (ref 19.3–51.7)
MCV: 96 fL — ABNORMAL HIGH (ref 79–95)
Mono # K/uL: 1.1 10*3/uL — ABNORMAL HIGH (ref 0.2–0.9)
Monocyte %: 12.5 % (ref 4.7–12.5)
Neut # K/uL: 5.8 10*3/uL (ref 1.6–6.1)
Platelets: 185 10*3/uL (ref 160–370)
RBC: 2.7 MIL/uL — ABNORMAL LOW (ref 3.9–5.2)
RDW: 17.8 % — ABNORMAL HIGH (ref 11.7–14.4)
Seg Neut %: 64.1 % (ref 34.0–71.1)
WBC: 9 10*3/uL (ref 4.0–10.0)

## 2013-01-10 LAB — PHOSPHORUS: Phosphorus: 1.9 mg/dL — ABNORMAL LOW (ref 2.7–4.5)

## 2013-01-10 LAB — SLIDE NUMBER: Slide # (Heme): 2135

## 2013-01-10 LAB — POCT GLUCOSE
Glucose POCT: 95 mg/dL (ref 60–99)
Glucose POCT: 101 mg/dL — ABNORMAL HIGH (ref 60–99)
Glucose POCT: 98 mg/dL (ref 60–99)

## 2013-01-10 LAB — MAGNESIUM: Magnesium: 1.5 mEq/L (ref 1.3–2.1)

## 2013-01-10 LAB — ALBUMIN: Albumin: 2.5 g/dL — ABNORMAL LOW (ref 3.5–5.2)

## 2013-01-10 MED ORDER — ONDANSETRON HCL 2 MG/ML IV SOLN *I*
4.0000 mg | Freq: Four times a day (QID) | INTRAMUSCULAR | Status: DC | PRN
Start: 2013-01-10 — End: 2013-01-10
  Administered 2013-01-10: 4 mg via INTRAVENOUS
  Filled 2013-01-10 (×2): qty 2

## 2013-01-10 MED ORDER — PROMETHAZINE HCL 25 MG/ML IJ SOLN *I*
12.5000 mg | Freq: Four times a day (QID) | INTRAMUSCULAR | Status: DC | PRN
Start: 2013-01-10 — End: 2013-01-17
  Administered 2013-01-11 – 2013-01-12 (×4): 12.5 mg via INTRAVENOUS
  Filled 2013-01-10 (×4): qty 1

## 2013-01-10 MED ORDER — ONDANSETRON HCL 2 MG/ML IV SOLN *I*
4.0000 mg | Freq: Once | INTRAMUSCULAR | Status: AC
Start: 2013-01-10 — End: 2013-01-10
  Administered 2013-01-10: 4 mg via INTRAVENOUS

## 2013-01-10 NOTE — Progress Notes (Addendum)
Patient had vomitus of 450 mL green liquid at 1700 this evening.  Notified provider Kayren Eaves, PA of the event.  Per provider orders writer administered PRN zofran with short lived moderate relief.  At this time patient is still experiencing nausea.  Will continue to monitor.      Patient had a second event of emesis.  Notified provider Donzetta Kohut, PA.  Please see order.    Jonn Shingles, RN

## 2013-01-10 NOTE — Progress Notes (Addendum)
Pt had an episode of emesis. It was a quite large amount of green emesis. Pt reports relief following emesis. Provider notified.    Pt only had 100cc urine output overnight. Provider notified.

## 2013-01-10 NOTE — Progress Notes (Signed)
Inpatient Medicine Progress Note (APSO) - LOS: 4    Assessment/Plan  1. SBO - Symptomatically improved.   2. Former cdiff sp full course of abx   3. SQ air due to clysis in right groin/thigh - no infection   4. Sp recent left aka   5. Acute kidney injury nearly resolved. Can discontinue IVF.    Active Hospital Problems    Diagnosis   . SBO (small bowel obstruction)   . Hypotension   . Acute kidney injury   . Dehydration      Resolved Hospital Problems    Diagnosis   No resolved problems to display.       DC Planning  Monday     Interval History:  Patient feeling better. zofran helping nausea. No vomiting    Medications brief:  Scheduled Meds:  . carvedilol  3.125 mg Oral Daily   . famotidine  20 mg Oral Q12H SCH   . carbidopa-levodopa ER  1 tablet Oral Q12H SCH   . amiodarone  400 mg Oral Daily   . sertraline  50 mg Oral Daily   . heparin (porcine)  5,000 Units Subcutaneous Q8H     Continuous Infusions:  . dextrose 5 % and 0.45% NaCl 40 mL/hr (01/09/13 2109)     PRN Meds:.ondansetron, sorbitol, albuterol    Medications given (24h):  amiodarone (PACERONE) tablet 400 mg    Date Action Dose Route User    01/10/2013 0856 Given 400 mg Oral Lavena Bullion, RN      carbidopa-levodopa ER (SINEMET CR) 25-100 MG per tablet 1 tablet    Date Action Dose Route User    01/10/2013 0856 Given 1 tablet Oral Lavena Bullion, RN    01/09/2013 2110 Given 1 tablet Oral Audery Amel, RN      carvedilol (COREG) tablet 3.125 mg    Date Action Dose Route User    01/09/2013 1736 Given 3.125 mg Oral Lavena Bullion, RN      Dextrose 5 %- Sodium Chloride 0.45%    Date Action Dose Route User    01/09/2013 2109 New Bag 40 mL/hr Intravenous Audery Amel, RN      famotidine (PEPCID) tablet 20 mg    Date Action Dose Route User    01/10/2013 0856 Given 20 mg Oral Lavena Bullion, RN    01/09/2013 2110 Given 20 mg Oral Audery Amel, RN      heparin (porcine) SQ injection 5,000 Units    Date Action Dose Route User    01/10/2013 0600 Given 5000  Units Subcutaneous Ty Hilts, RN    01/09/2013 2109 Given 5000 Units Subcutaneous Audery Amel, RN    01/09/2013 1552 Given 5000 Units Subcutaneous Lavena Bullion, RN      ondansetron [facility]) injection 4 mg    Date Action Dose Route User    01/10/2013 0929 Given 4 mg Intravenous Lavena Bullion, RN    01/09/2013 1552 Given 4 mg Intravenous Lavena Bullion, RN      sertraline (ZOLOFT) tablet 50 mg    Date Action Dose Route User    01/10/2013 0856 Given 50 mg Oral Lavena Bullion, RN          Intake/Output    Intake/Output Summary (Last 24 hours) at 01/10/13 1235  Last data filed at 01/10/13 0900   Gross per 24 hour   Intake 1559.67 ml   Output    425 ml   Net 1134.67 ml        Vital Signs Ranges:  Temp:  [36 C (96.8 F)-36.8 C (98.2 F)] 36.2 C (97.2 F)  Heart Rate:  [70-77] 74  Resp:  [18-20] 20  BP: (118-140)/(54-70) 120/60 mmHg     Physical Exam:  Blood pressure 120/60, pulse 74, temperature 36.2 C (97.2 F), temperature source Temporal, resp. rate 20, height 1.6 m (5\' 3" ), weight 54.795 kg (120 lb 12.8 oz), SpO2 97.00%.  General: patient smiling, NAD  Gastrointestinal: abd soft, NT    Data:      Recent Labs  Lab 01/10/13  0601 01/09/13  0457 01/08/13  0436   WBC 9.0 9.9 8.7   Hemoglobin 8.3* 8.2* 7.4*   Hematocrit 26* 27* 24*   Platelets 185 213 197     Other Labs:      Recent Labs  Lab 01/10/13  0601 01/09/13  1628 01/09/13  0457   Sodium 142 145 146*   Potassium 3.9 4.0 3.8   Chloride 110* 113* 113*   CO2 21 22 23    UN 15 17 19    Creatinine 1.02* 1.16* 1.26*   GFR,Caucasian 57* 49* 44*   GFR,Black 65 56* 51*   Glucose 98 98 80   Calcium 8.6 8.7 8.4*      X-rays the past 24 hours: No results found.       Phylliss Blakes, MD   01/10/2013   12:35 PM

## 2013-01-11 LAB — BASIC METABOLIC PANEL
Anion Gap: 10 (ref 7–16)
Anion Gap: 14 (ref 7–16)
CO2: 21 mmol/L (ref 20–28)
CO2: 20 mmol/L (ref 20–28)
Calcium: 8.9 mg/dL (ref 8.6–10.2)
Calcium: 9 mg/dL (ref 8.6–10.2)
Chloride: 111 mmol/L — ABNORMAL HIGH (ref 96–108)
Chloride: 111 mmol/L — ABNORMAL HIGH (ref 96–108)
Creatinine: 1.09 mg/dL — ABNORMAL HIGH (ref 0.51–0.95)
Creatinine: 1.05 mg/dL — ABNORMAL HIGH (ref 0.51–0.95)
GFR,Black: 60 *
GFR,Black: 63 *
GFR,Caucasian: 52 * — AB
GFR,Caucasian: 55 * — AB
Glucose: 96 mg/dL (ref 60–99)
Glucose: 110 mg/dL — ABNORMAL HIGH (ref 60–99)
Lab: 13 mg/dL (ref 6–20)
Lab: 14 mg/dL (ref 6–20)
Potassium: 3.3 mmol/L (ref 3.3–5.1)
Potassium: 3.3 mmol/L (ref 3.3–5.1)
Sodium: 142 mmol/L (ref 133–145)
Sodium: 145 mmol/L (ref 133–145)

## 2013-01-11 LAB — CBC AND DIFFERENTIAL
Baso # K/uL: 0 10*3/uL (ref 0.0–0.1)
Basophil %: 0.2 % (ref 0.1–1.2)
Eos # K/uL: 0.1 10*3/uL (ref 0.0–0.4)
Eosinophil %: 0.7 % (ref 0.7–5.8)
Hematocrit: 28 % — ABNORMAL LOW (ref 34–45)
Hemoglobin: 8.9 g/dL — ABNORMAL LOW (ref 11.2–15.7)
Lymph # K/uL: 2.1 10*3/uL (ref 1.2–3.7)
Lymphocyte %: 20.5 % (ref 19.3–51.7)
MCV: 96 fL — ABNORMAL HIGH (ref 79–95)
Mono # K/uL: 1.3 10*3/uL — ABNORMAL HIGH (ref 0.2–0.9)
Monocyte %: 12.6 % — ABNORMAL HIGH (ref 4.7–12.5)
Neut # K/uL: 6.6 10*3/uL — ABNORMAL HIGH (ref 1.6–6.1)
Platelets: 208 10*3/uL (ref 160–370)
RBC: 2.9 MIL/uL — ABNORMAL LOW (ref 3.9–5.2)
RDW: 17.5 % — ABNORMAL HIGH (ref 11.7–14.4)
Seg Neut %: 64.5 % (ref 34.0–71.1)
WBC: 10.2 10*3/uL — ABNORMAL HIGH (ref 4.0–10.0)

## 2013-01-11 LAB — BLOOD CULTURE
Bacterial Blood Culture: 0
Bacterial Blood Culture: 0

## 2013-01-11 LAB — POCT GLUCOSE
Glucose POCT: 96 mg/dL (ref 60–99)
Glucose POCT: 108 mg/dL — ABNORMAL HIGH (ref 60–99)
Glucose POCT: 104 mg/dL — ABNORMAL HIGH (ref 60–99)
Glucose POCT: 98 mg/dL (ref 60–99)

## 2013-01-11 LAB — ALBUMIN: Albumin: 2.7 g/dL — ABNORMAL LOW (ref 3.5–5.2)

## 2013-01-11 LAB — PHOSPHORUS: Phosphorus: 1.9 mg/dL — ABNORMAL LOW (ref 2.7–4.5)

## 2013-01-11 LAB — MAGNESIUM: Magnesium: 1.4 mEq/L (ref 1.3–2.1)

## 2013-01-11 NOTE — Progress Notes (Signed)
Pt had episode of nausea this am was given PRN phenergan. Worthy Rancher, RN

## 2013-01-11 NOTE — Progress Notes (Signed)
Inpatient Medicine Progress Note (APSO) - LOS: 5    Assessment/Plan  1. SBO - Symptomatically resolved. Her slight increase in WBC count today is belied by her overall good appearance clinically.   2. Former cdiff sp full course of abx   3. SQ air due to clysis in right groin/thigh - no infection   4. Acute kidney injury nearly resolved.     Active Hospital Problems    Diagnosis   . SBO (small bowel obstruction)   . Hypotension   . Acute kidney injury   . Dehydration      Resolved Hospital Problems    Diagnosis   No resolved problems to display.       DC Planning  Monday most likely    Interval History:  Feeling better; no n/v. Tolerating po.    Medications brief:  Scheduled Meds:  . carvedilol  3.125 mg Oral Daily   . famotidine  20 mg Oral Q12H SCH   . carbidopa-levodopa ER  1 tablet Oral Q12H SCH   . amiodarone  400 mg Oral Daily   . sertraline  50 mg Oral Daily   . heparin (porcine)  5,000 Units Subcutaneous Q8H     Continuous Infusions:   PRN Meds:.promethazine, sorbitol, albuterol    Medications given (24h):  amiodarone (PACERONE) tablet 400 mg    Date Action Dose Route User    01/11/2013 0854 Given 400 mg Oral Lavena Bullion, RN      carbidopa-levodopa ER (SINEMET CR) 25-100 MG per tablet 1 tablet    Date Action Dose Route User    01/11/2013 0854 Given 1 tablet Oral Lavena Bullion, RN    01/10/2013 2148 Given 1 tablet Oral Jonn Shingles, RN      carvedilol (COREG) tablet 3.125 mg    Date Action Dose Route User    01/10/2013 1951 Given 3.125 mg Oral Jonn Shingles, RN      famotidine (PEPCID) tablet 20 mg    Date Action Dose Route User    01/11/2013 0854 Given 20 mg Oral Lavena Bullion, RN    01/10/2013 2148 Given 20 mg Oral Jonn Shingles, RN      heparin (porcine) SQ injection 5,000 Units    Date Action Dose Route User    01/11/2013 0546 Given 5000 Units Subcutaneous Junie Spencer, RN    01/10/2013 2132 Given 5000 Units Subcutaneous Jonn Shingles, RN    01/10/2013 1319 Given 5000  Units Subcutaneous Lavena Bullion, RN      ondansetron [facility]) injection 4 mg    Date Action Dose Route User    01/10/2013 1735 Given 4 mg Intravenous Jonn Shingles, RN      ondansetron [facility]) injection 4 mg    Date Action Dose Route User    01/10/2013 2133 Given 4 mg Intravenous Jonn Shingles, RN      promethazine (PHENERGAN) injection 12.5 mg    Date Action Dose Route User    01/11/2013 0540 Given 12.5 mg Intravenous Burgan, Babette R, RN      sertraline (ZOLOFT) tablet 50 mg    Date Action Dose Route User    01/11/2013 0854 Given 50 mg Oral Lavena Bullion, RN          Intake/Output    Intake/Output Summary (Last 24 hours) at 01/11/13 1053  Last data filed at 01/11/13 0830   Gross per 24 hour   Intake 1008.33 ml   Output   1850 ml  Net -841.67 ml        Vital Signs Ranges:   Temp:  [36.1 C (97 F)-37.2 C (99 F)] 37.2 C (99 F)  Heart Rate:  [71-77] 71  Resp:  [18] 18  BP: (120-128)/(56-64) 122/60 mmHg     Physical Exam:  Blood pressure 122/60, pulse 71, temperature 37.2 C (99 F), temperature source Oral, resp. rate 18, height 1.6 m (5\' 3" ), weight 59.512 kg (131 lb 3.2 oz), SpO2 94.00%.  General: smiling and in no distress  Gastrointestinal: abd soft, NT    Data:      Recent Labs  Lab 01/11/13  0127 01/10/13  0601 01/09/13  0457   WBC 10.2* 9.0 9.9   Hemoglobin 8.9* 8.3* 8.2*   Hematocrit 28* 26* 27*   Platelets 208 185 213     Other Labs:      Recent Labs  Lab 01/11/13  0127 01/10/13  2005 01/10/13  0601   Sodium 142 140 142   Potassium 3.3 3.4 3.9   Chloride 111* 109* 110*   CO2 21 20 21    UN 13 13 15    Creatinine 1.09* 1.04* 1.02*   GFR,Caucasian 52* 55* 57*   GFR,Black 60 64 65   Glucose 96 108* 98   Calcium 8.9 9.0 8.6      X-rays the past 24 hours: No results found.       Phylliss Blakes, MD   01/11/2013   10:53 AM

## 2013-01-11 NOTE — Progress Notes (Signed)
Pt had two episodes of emesis of green color and still was complaining of nausea. Phenergan IV was administered to relieve the symptoms. PA cross cover notified, will continue to monitor.

## 2013-01-12 ENCOUNTER — Ambulatory Visit: Payer: Self-pay | Admitting: Vascular Surgery

## 2013-01-12 LAB — PROTIME-INR

## 2013-01-12 LAB — BASIC METABOLIC PANEL
Anion Gap: 14 (ref 7–16)
Anion Gap: 11 (ref 7–16)
CO2: 20 mmol/L (ref 20–28)
CO2: 19 mmol/L — ABNORMAL LOW (ref 20–28)
Calcium: 9 mg/dL (ref 8.6–10.2)
Calcium: 8.4 mg/dL — ABNORMAL LOW (ref 8.6–10.2)
Chloride: 110 mmol/L — ABNORMAL HIGH (ref 96–108)
Chloride: 116 mmol/L — ABNORMAL HIGH (ref 96–108)
Creatinine: 1.16 mg/dL — ABNORMAL HIGH (ref 0.51–0.95)
Creatinine: 1.01 mg/dL — ABNORMAL HIGH (ref 0.51–0.95)
GFR,Black: 56 * — AB
GFR,Black: 66 *
GFR,Caucasian: 49 * — AB
GFR,Caucasian: 57 * — AB
Glucose: 120 mg/dL — ABNORMAL HIGH (ref 60–99)
Glucose: 96 mg/dL (ref 60–99)
Lab: 15 mg/dL (ref 6–20)
Lab: 16 mg/dL (ref 6–20)
Potassium: 3.4 mmol/L (ref 3.3–5.1)
Potassium: 3.2 mmol/L — ABNORMAL LOW (ref 3.3–5.1)
Sodium: 144 mmol/L (ref 133–145)
Sodium: 146 mmol/L — ABNORMAL HIGH (ref 133–145)

## 2013-01-12 LAB — CBC AND DIFFERENTIAL
Baso # K/uL: 0 10*3/uL (ref 0.0–0.1)
Basophil %: 0.1 % (ref 0.1–1.2)
Eos # K/uL: 0 10*3/uL (ref 0.0–0.4)
Eosinophil %: 0.1 % — ABNORMAL LOW (ref 0.7–5.8)
Hematocrit: 30 % — ABNORMAL LOW (ref 34–45)
Hemoglobin: 9.4 g/dL — ABNORMAL LOW (ref 11.2–15.7)
Lymph # K/uL: 1.5 10*3/uL (ref 1.2–3.7)
Lymphocyte %: 8.4 % — ABNORMAL LOW (ref 19.3–51.7)
MCV: 96 fL — ABNORMAL HIGH (ref 79–95)
Mono # K/uL: 1.7 10*3/uL — ABNORMAL HIGH (ref 0.2–0.9)
Monocyte %: 9.4 % (ref 4.7–12.5)
Neut # K/uL: 15 10*3/uL — ABNORMAL HIGH (ref 1.6–6.1)
Platelets: 247 10*3/uL (ref 160–370)
RBC: 3.1 MIL/uL — ABNORMAL LOW (ref 3.9–5.2)
RDW: 17.6 % — ABNORMAL HIGH (ref 11.7–14.4)
Seg Neut %: 81.1 % — ABNORMAL HIGH (ref 34.0–71.1)
WBC: 18.4 10*3/uL — ABNORMAL HIGH (ref 4.0–10.0)

## 2013-01-12 LAB — AST: AST: 18 U/L (ref 0–35)

## 2013-01-12 LAB — PHOSPHORUS: Phosphorus: 1.7 mg/dL — ABNORMAL LOW (ref 2.7–4.5)

## 2013-01-12 LAB — POCT GLUCOSE
Glucose POCT: 108 mg/dL — ABNORMAL HIGH (ref 60–99)
Glucose POCT: 94 mg/dL (ref 60–99)
Glucose POCT: 94 mg/dL (ref 60–99)
Glucose POCT: 87 mg/dL (ref 60–99)

## 2013-01-12 LAB — ALT: ALT: 5 U/L (ref 0–35)

## 2013-01-12 LAB — ALBUMIN: Albumin: 3 g/dL — ABNORMAL LOW (ref 3.5–5.2)

## 2013-01-12 LAB — MAGNESIUM: Magnesium: 1.4 mEq/L (ref 1.3–2.1)

## 2013-01-12 LAB — LACTATE, VENOUS, WHOLE BLOOD: Lactate VEN,WB: 1 mmol/L (ref 0.5–2.2)

## 2013-01-12 MED ORDER — SODIUM CHLORIDE 0.9 % IV SOLN WRAPPED *I*
100.0000 mL/h | Status: DC
Start: 2013-01-12 — End: 2013-01-13
  Administered 2013-01-12 (×3): 100 mL/h via INTRAVENOUS

## 2013-01-12 MED ORDER — ONDANSETRON HCL 2 MG/ML IV SOLN *I*
4.0000 mg | Freq: Four times a day (QID) | INTRAMUSCULAR | Status: DC | PRN
Start: 2013-01-12 — End: 2013-01-12

## 2013-01-12 MED ORDER — MICONAZOLE NITRATE 2 % (SECURA) EX CREAM *I*
TOPICAL_CREAM | Freq: Two times a day (BID) | CUTANEOUS | Status: DC
Start: 2013-01-12 — End: 2013-01-23
  Filled 2013-01-12 (×2): qty 97.5

## 2013-01-12 NOTE — Progress Notes (Signed)
Pt NG tube drained approx. 600 ml with LIWS. Canister liner changed having brown green drainage with foul odor.

## 2013-01-12 NOTE — Plan of Care (Signed)
Michelle [name] is able to verbalize understanding regarding her plan of care. She remains NPO, except for medications. She is tolerating sips of clear liquids. She is assisted with AM care and turning and positioning. No emesis, no BM this shift. Jettie Booze, RN  Bowel Elimination    . Elimination patterns are normal or improving Progressing towards goal        Mobility    . Functional status is maintained or improved - Geriatric Progressing towards goal        Nutrition    . Nutritional status is maintained or improved - Geriatric Progressing towards goal

## 2013-01-12 NOTE — Progress Notes (Signed)
Pt has one episode of 240 cc greenish color emesis and continued c/o N. Abdomen soft and distended with diminished BS. Denys pain during assessment. Prn phenergan medicated. Will continue to monitor closely.

## 2013-01-12 NOTE — Progress Notes (Addendum)
General Surgery Daily Progress Note      Subjective:    Surgery Signed off on patient 01/09/13. This morning were reconsulted due to bilious emesis, white count elevation from 10.2 to 18.4, with dilated loops of bowel on KUB. She has not had a bowel movement for the past 2 days and her abdomen is more distended. Patient has remained afebrile and denies abdominal pain.  Of note creatinine 1.16 from >3 at admission.     Objective:    Vital signs:  Filed Vitals:    01/11/13 2211 01/12/13 0055 01/12/13 0546 01/12/13 1041   BP: 118/64 122/60 110/70 96/54   Pulse: 72 94 89 80   Temp: 36.7 C (98.1 F) 36.5 C (97.7 F) 36.5 C (97.7 F) 37.5 C (99.5 F)   TempSrc: Oral   Oral   Resp: 20 18 16 16    Height:       Weight:  56.518 kg (124 lb 9.6 oz)     SpO2:  91% 95% 94%       Ins/Outs:    IODETAILS    Intake/Output Summary (Last 24 hours) at 01/12/13 1213  Last data filed at 01/12/13 0259   Gross per 24 hour   Intake    240 ml   Output    390 ml   Net   -150 ml         Physical Exam:    General Appearance: In no apparent distress  Cardiac: RRR, no m/r/g  Respiratory: Unlabored work of breathing   Abdomen: Soft but distended, active sounds on auscultation. Yellow green emesis noted on towels..  Genitourinary: no foley.  Extremities: warm, well-perfused; no cyanosis, well healed left AKA stump      Assessment/Plan:    67 y.o. female with PMH significant for CAD, CHF, COPD, CVA, VT s/p AICD, anemia, PVD and abdominal PSH consisted of a left hemicolectomy.   Presented to the [facility] ED from nursing home after one week history of nausea and vomiting, was briefly admitted to ICU due to hypotension (systolic 80) for fluid resuscitation. In addition she had venous lactate of  2 with Cr 3.34 and WBC 15.5. She had been improving on conservative measures with her labs normalizing. She tolerated clears and NG was removed on the 27th and we signed off on the 28th.   Reconsulted 01/12/13 for bilious emesis, no BM for 2 days, WBC increase to  18, and dilated loops of bowel on KUB.    -- Afebrile, WBC 18, Lactate 2, Cr 1.16   -- abdomen distended but nontender and soft  -- Would recommend 16 french NG to LIWS  -- NPO, IVF while NPO  -- Zofran prn for nausea  -- Blood cultures, urinalysis with reflex to culture  -- Strict I&O  -- Daily electrolyte check and replete as needed   -- replete K to >4 and Mg to >2  -- patient  not open to idea of surgery, will attempt conservative treatment, but if recurrences continue to be a problem, may require surgical intervention.     Anson Fret, MD     01/12/2013     12:13 PM     I saw and evaluated the patient. I agree with the resident's/fellow's findings and plan of care as documented above. Details of my evaluation are as follows: patient seen while NG being placed by surgical housestaff.  She is awake and alert and denies any abdominal pain.  She has some distension, but no tenderness  to housestaff palpation deep into the abdomen.     Xray reviewed and dilated small bowel seen.  Her son will speak with her about the possibility of surgery.  She would have to consent to an open procedure, even though we could try laparoscopically.  I do not feel she has any ischemic bowel from an SBO now and conservative management indicated.  If no improvement we will need to decide on a surgical approach.  We may need an SBFT prior if she does not declare herself.  The WBC of 18K is concerning.      Roe Rutherford, MD

## 2013-01-12 NOTE — Progress Notes (Signed)
Inpatient Medicine Progress Note (APSO) - LOS: 6    Assessment/Plan  1. SBO - with repeat emesis and with abd distention - checking KUB - does not presently have NGT. Using promethazine prn (was used earlier for her without a problem; would change to zofran or haldol with low threshold).   2. Increasing leukocytosis - no diarrhea (hx of cdiff) and no respiratory sxs. She has been off abx for 3d. The right thigh was a concern earlier because of SC air but her exam is reassuring today and it was initially caused by clysis at the SNF prior to being admitted. We are checking LFTs and a UA. Restart IVF.  3. Chronic systolic heart failure (ischemic cardiomyopathy) - no acute component presently - has AICD and on amiodarone, low-dose coreg (not on diuretics presently).  4. Parkinsons - on carbidopa-levodopa      Active Hospital Problems    Diagnosis   . SBO (small bowel obstruction)   . Hypotension   . Acute kidney injury   . Dehydration      Resolved Hospital Problems    Diagnosis   No resolved problems to display.       DC Planning  ?    Interval History:  Pt with emesis last night and with increased white count. No cough, aspiration, dyspnea, chest pain. No abdominal pain. No BM in 2 days. No dysuria.     Medications brief:  Scheduled Meds:  . carvedilol  3.125 mg Oral Daily   . famotidine  20 mg Oral Q12H SCH   . carbidopa-levodopa ER  1 tablet Oral Q12H SCH   . amiodarone  400 mg Oral Daily   . sertraline  50 mg Oral Daily   . heparin (porcine)  5,000 Units Subcutaneous Q8H     Continuous Infusions:  . sodium chloride 100 mL/hr (01/12/13 0409)     PRN Meds:.promethazine, sorbitol, albuterol    Medications given (24h):  carbidopa-levodopa ER (SINEMET CR) 25-100 MG per tablet 1 tablet    Date Action Dose Route User    01/11/2013 2127 Given 1 tablet Oral Regenia Skeeter, RN      carvedilol (COREG) tablet 3.125 mg    Date Action Dose Route User    01/11/2013 1840 Given 3.125 mg Oral Lavena Bullion, RN      famotidine  (PEPCID) tablet 20 mg    Date Action Dose Route User    01/11/2013 2127 Given 20 mg Oral Regenia Skeeter, RN      heparin (porcine) SQ injection 5,000 Units    Date Action Dose Route User    01/12/2013 0538 Given 5000 Units Subcutaneous Regenia Skeeter, RN    01/11/2013 2127 Given 5000 Units Subcutaneous Regenia Skeeter, [place]    01/11/2013 1541 Given 5000 Units Subcutaneous Lavena Bullion, RN      promethazine [facility]) injection 12.5 mg    Date Action Dose Route User    01/12/2013 0641 Given 12.5 mg Intravenous Regenia Skeeter, RN    01/12/2013 0034 Given 12.5 mg Intravenous Regenia Skeeter, RN    01/11/2013 1657 Given 12.5 mg Intravenous Lavena Bullion, RN      sodium chloride 0.9 % IV    Date Action Dose Route User    01/12/2013 0409 New Bag 100 mL/hr Intravenous Regenia Skeeter, RN          Intake/Output    Intake/Output Summary (Last 24 hours) at 01/12/13 1610  Last data filed at 01/12/13 [phone]  Gross per 24 hour   Intake    240 ml   Output    390 ml   Net   -150 ml        Vital Signs Ranges:   Temp:  [36.5 C (97.7 F)-37.2 C (99 F)] 36.5 C (97.7 F)  Heart Rate:  [70-94] 89  Resp:  [16-20] 16  BP: (110-122)/(60-70) 110/70 mmHg     Physical Exam:  Blood pressure 110/70, pulse 89, temperature 36.5 C (97.7 F), temperature source Oral, resp. rate 16, height 1.6 m (5\' 3" ), weight 56.518 kg (124 lb 9.6 oz), SpO2 95.00%.  General: patient looks ill and feels warm  Cardiovascular: Normal rate, regular rhythm - no murmur  Pulmonary: clear  Gastrointestinal: abd distended and tympanitic without tenderness  Skin: no crepitus or tenderness in the right thigh/groin (area of prior SC emphysema); LLE stump appears OK    Data:      Recent Labs  Lab 01/12/13  0058 01/11/13  0127 01/10/13  0601   WBC 18.4* 10.2* 9.0   Hemoglobin 9.4* 8.9* 8.3*   Hematocrit 30* 28* 26*   Platelets 247 208 185     Other Labs:      Recent Labs  Lab 01/12/13  0058 01/11/13  1812 01/11/13  0127   Sodium 144 145 142    Potassium 3.4 3.3 3.3   Chloride 110* 111* 111*   CO2 20 20 21    UN 15 14 13    Creatinine 1.16* 1.05* 1.09*   GFR,Caucasian 49* 55* 52*   GFR,Black 56* 63 60   Glucose 120* 110* 96   Calcium 9.0 9.0 8.9      X-rays the past 24 hours: No results found.       Phylliss Blakes, MD   01/12/2013   9:43 AM

## 2013-01-12 NOTE — Progress Notes (Signed)
Nursing reports multiple episodes of emesis over night, vomiting up pills. No recent BMs. Very little UOP.  Will make NPO, start IVF, has PRN phenergan.

## 2013-01-12 NOTE — Plan of Care (Signed)
Problem: Inadequate oral intake  Goal: Nutritional status maintained or improved  Outcome: Progressing towards goal  Intervention: Camera operator (oral supplement)  Current diet: NPO.   Overnight pt developed recurrent vomiting and abdominal distension.   When medically appropriate: order low fiber diet with strawberry Ensure 1.5 TID.       Comments:                                                   Follow Up  Nutrition Note       Last weight   01/12/13 56.518 kg (124 lb 9.6 oz)       Estimated Nutrient Needs:  Total Caloric Estimated Needs: 30 cal/kg- 1700-1850 cal./day  Needs based on (weight): Actual  Total Protein Estimated Needs: 63" ( s/p aka) 56.1kg  1.3-1.5 gm pro/kg ( for healing from recent surgery) 70- 85 gm pro/day  Needs based on (weight): Actual    67 yo F from LTC facility, CVA, parkinsons, (L) AKA, copd; adm w/ SBO.   SBO appeared to resolve, with diet advancing and tolerated fairly well until 11/30 evening, when she began to vomit with ^abd distension.   Surgery is being consulted; now 6 days inadequate oral food/beverage intake related to SBO.   Nutrition care plan is outlined above. Pt states she likes the strawberry Ensure 1.5 along with [facility] food, she just can't tolerate it at this time.     Vira Blanco, RD  Nutrition Services  Pager [phone] phone [phone]

## 2013-01-12 NOTE — Progress Notes (Signed)
Physician Assistant Progress Note    Interval History:  Pt reports that she was nauseous and vomiting overnight, and feels warm. She denies current nausea and any pain. Abdomen feels distended. Overnight events per cross cover PA noted.     Intake/Output    Intake/Output Summary (Last 24 hours) at 01/12/13 1046  Last data filed at 01/12/13 0259   Gross per 24 hour   Intake    240 ml   Output    390 ml   Net   -150 ml        Vital Signs:   Temp:  [36.5 C (97.7 F)-37.5 C (99.5 F)] 37.5 C (99.5 F)  Heart Rate:  [70-94] 80  Resp:  [16-20] 16  BP: (96-122)/(54-70) 96/54 mmHg       Physical Exam    General: Patient chronically ill appearing, mod distress  Cardiovascular: RRR  Pulmonary: Lungs are CTA, normal respiratory effort  Gastrointestinal: Abd distended, soft, NT  Ext: No crepitus or tenderness in right thigh/groin    Data:      Recent Labs  Lab 01/12/13  0058 01/11/13  0127 01/10/13  0601   WBC 18.4* 10.2* 9.0   Hemoglobin 9.4* 8.9* 8.3*   Hematocrit 30* 28* 26*   Platelets 247 208 185     Other Labs:      Recent Labs  Lab 01/12/13  0058 01/11/13  1812 01/11/13  0127   Sodium 144 145 142   Potassium 3.4 3.3 3.3   Chloride 110* 111* 111*   CO2 20 20 21    UN 15 14 13    Creatinine 1.16* 1.05* 1.09*      X-rays the past 24 hours: No results found.     Assessment/Plan  Active Hospital Problems    Diagnosis   . SBO (small bowel obstruction)   . Hypotension   . Acute kidney injury   . Dehydration      Resolved Hospital Problems    Diagnosis   No resolved problems to display.     1. SBO   - Called surgery to consult for N/V, increasing abdominal distention and distended bowel loops on KUB  - Promethazine PRN  - Increasing leukocytosis - checked UA, blood cultures, lactate, LFT's  - NPO   - Normal saline at 100cc/hr    2. Systolic heart failure  - Continue Amiodarone  - Continue Coreg     3. Parkinsons   - Continue Sinemet      Heparin for DVT prophylaxis   Full code     DC Planning    [name], [place]   01/12/2013    10:46 AM

## 2013-01-13 LAB — BASIC METABOLIC PANEL
Anion Gap: 10 (ref 7–16)
Anion Gap: 11 (ref 7–16)
CO2: 21 mmol/L (ref 20–28)
CO2: 20 mmol/L (ref 20–28)
Calcium: 8.4 mg/dL — ABNORMAL LOW (ref 8.6–10.2)
Calcium: 8.3 mg/dL — ABNORMAL LOW (ref 8.6–10.2)
Chloride: 114 mmol/L — ABNORMAL HIGH (ref 96–108)
Chloride: 117 mmol/L — ABNORMAL HIGH (ref 96–108)
Creatinine: 1.02 mg/dL — ABNORMAL HIGH (ref 0.51–0.95)
Creatinine: 0.91 mg/dL (ref 0.51–0.95)
GFR,Black: 65 *
GFR,Black: 75 *
GFR,Caucasian: 57 * — AB
GFR,Caucasian: 65 *
Glucose: 81 mg/dL (ref 60–99)
Glucose: 91 mg/dL (ref 60–99)
Lab: 17 mg/dL (ref 6–20)
Lab: 14 mg/dL (ref 6–20)
Potassium: 3.2 mmol/L — ABNORMAL LOW (ref 3.3–5.1)
Potassium: 3.4 mmol/L (ref 3.3–5.1)
Sodium: 145 mmol/L (ref 133–145)
Sodium: 148 mmol/L — ABNORMAL HIGH (ref 133–145)

## 2013-01-13 LAB — POCT GLUCOSE
Glucose POCT: 106 mg/dL — ABNORMAL HIGH (ref 60–99)
Glucose POCT: 112 mg/dL — ABNORMAL HIGH (ref 60–99)
Glucose POCT: 92 mg/dL (ref 60–99)
Glucose POCT: 90 mg/dL (ref 60–99)

## 2013-01-13 LAB — CBC AND DIFFERENTIAL
Baso # K/uL: 0 10*3/uL (ref 0.0–0.1)
Basophil %: 0.1 % (ref 0.1–1.2)
Eos # K/uL: 0.1 10*3/uL (ref 0.0–0.4)
Eosinophil %: 0.6 % — ABNORMAL LOW (ref 0.7–5.8)
Hematocrit: 25 % — ABNORMAL LOW (ref 34–45)
Hemoglobin: 7.9 g/dL — ABNORMAL LOW (ref 11.2–15.7)
Lymph # K/uL: 2.7 10*3/uL (ref 1.2–3.7)
Lymphocyte %: 21.2 % (ref 19.3–51.7)
MCV: 97 fL — ABNORMAL HIGH (ref 79–95)
Mono # K/uL: 1.6 10*3/uL — ABNORMAL HIGH (ref 0.2–0.9)
Monocyte %: 12.3 % (ref 4.7–12.5)
Neut # K/uL: 8.2 10*3/uL — ABNORMAL HIGH (ref 1.6–6.1)
Platelets: 202 10*3/uL (ref 160–370)
RBC: 2.6 MIL/uL — ABNORMAL LOW (ref 3.9–5.2)
RDW: 17.8 % — ABNORMAL HIGH (ref 11.7–14.4)
Seg Neut %: 64.8 % (ref 34.0–71.1)
WBC: 12.6 10*3/uL — ABNORMAL HIGH (ref 4.0–10.0)

## 2013-01-13 LAB — PHOSPHORUS: Phosphorus: 1.4 mg/dL — ABNORMAL LOW (ref 2.7–4.5)

## 2013-01-13 LAB — PREALBUMIN: Prealbumin: 12 mg/dL — ABNORMAL LOW (ref 20–40)

## 2013-01-13 LAB — MAGNESIUM: Magnesium: 1.3 mEq/L (ref 1.3–2.1)

## 2013-01-13 LAB — ALBUMIN: Albumin: 2.7 g/dL — ABNORMAL LOW (ref 3.5–5.2)

## 2013-01-13 MED ORDER — POTASSIUM CHLORIDE 10 MEQ/50ML IV SOLN *I*
10.0000 meq | INTRAVENOUS | Status: AC
Start: 2013-01-13 — End: 2013-01-13
  Administered 2013-01-13 (×2): 10 meq via INTRAVENOUS

## 2013-01-13 MED ORDER — SODIUM CHLORIDE 0.9 % IV SOLN WRAPPED *I*
15.0000 mmol | Freq: Once | INTRAVENOUS | Status: AC
Start: 2013-01-13 — End: 2013-01-13
  Administered 2013-01-13: 15 mmol via INTRAVENOUS
  Filled 2013-01-13: qty 5

## 2013-01-13 MED ORDER — MAGNESIUM SULFATE 2GM IN 50ML STERILE WATER *A*
2000.0000 mg | Freq: Once | INTRAMUSCULAR | Status: AC
Start: 2013-01-13 — End: 2013-01-13
  Administered 2013-01-13: 2000 mg via INTRAVENOUS
  Filled 2013-01-13: qty 50

## 2013-01-13 MED ORDER — DEXTROSE 5% AND 0.9% NACL IV SOLN *I*
75.0000 mL/h | INTRAVENOUS | Status: DC
Start: 2013-01-13 — End: 2013-01-14
  Administered 2013-01-13: 100 mL/h via INTRAVENOUS
  Administered 2013-01-13 (×2): 75 mL/h via INTRAVENOUS
  Administered 2013-01-13: 100 mL/h via INTRAVENOUS
  Administered 2013-01-14: 75 mL/h via INTRAVENOUS

## 2013-01-13 NOTE — Progress Notes (Addendum)
Surgery Progress Note    SUBJECTIVE:   No acute events overnight. No flatus/BM. Denies having abdominal pain/N/V.    NGT to LIWS with bilious drainage. Recorded only 600 cc/24 hrs, but per nursing patient has already filled 1 canister and already with 300 cc drainage in canister this morning.    OBJECTIVE:   BP: (96-118)/(54-60)   Temp:  [36.9 C (98.4 F)-37.5 C (99.5 F)]   Temp src:  [-]   Heart Rate:  [76-86]   Resp:  [16-18]   SpO2:  [94 %-100 %]   Date 01/12/13 0700 - 01/13/13 0659 01/13/13 0700 - 01/14/13 0659   Shift 0700-1459 1500-2259 2300-0659 24 Hour Total 0700-1459 1500-2259 2300-0659 24 Hour Total   I  N  T  A  K  E   I.V.  (mL/kg/hr) 846.7  (1.9) 733.3  (1.6) 361.7 1941.7          Volume (mL) (sodium chloride 0.9 % IV) 846.7 733.3 361.7 1941.7        Other   60 60          Other   60 60        Shift Total  (mL/kg) 846.7  (15) 733.3  (13) 421.7  (7.5) 2001.7  (35.4)       O  U  T  P  U  T   Urine  (mL/kg/hr) 0  (0) 0  (0)  0          Urine 0 0  0          Urine Occurrence 0 x 0 x  0 x        Emesis/NG output 0 600  600          Emesis 0 0  0          Emesis Occurrence  0 x  0 x          Output (ml) (NG/OG Tube Nasogastric)  600  600        Other 0 0  0          Other 0 0  0        Stool 0 0  0          Stool Occurrence 0 x 0 x  0 x          Stool 0 0  0        Blood 0 0  0          Blood 0 0  0        Shift Total  (mL/kg) 0  (0) 600  (10.6)  600  (10.6)       NET 846.7 133.3 421.7 1401.7       Weight (kg) 56.5 56.5 56.5 56.5 56.5 56.5 56.5 56.5     Scheduled Meds:   . miconazole   Topical Q12H SCH   . carvedilol  3.125 mg Oral Daily   . famotidine  20 mg Oral Q12H SCH   . carbidopa-levodopa ER  1 tablet Oral Q12H SCH   . amiodarone  400 mg Oral Daily   . sertraline  50 mg Oral Daily   . heparin (porcine)  5,000 Units Subcutaneous Q8H     Continuous Infusions:   . dextrose 5 % and 0.9% NaCl 100 mL/hr (01/13/13 0155)     PRN Meds:promethazine, sorbitol, albuterol  Labs:  No components found with this  basename: PHART, PO2ART, PCO2ART, O2SATART,  Konrad Penta,    Recent Labs  Lab 01/13/13  0203 01/12/13  1901 01/12/13  0058   Sodium 145 146* 144   Potassium 3.2* 3.2* 3.4   Chloride 114* 116* 110*   CO2 21 19* 20      Recent Labs  Lab 01/12/13  0921 01/06/13  1515   INR CANCELED 1.1   aPTT  --  28.5      Recent Labs  Lab 01/13/13  0203 01/12/13  0058 01/11/13  0127   WBC 12.6* 18.4* 10.2*   Hemoglobin 7.9* 9.4* 8.9*   Hematocrit 25* 30* 28*   Platelets 202 247 208       Radiology Impressions (Last 3 Days):  * Portable Abdomen Standard Ap Single View/kub    01/12/2013   IMPRESSION:   Diffuse small bowel dilatation most consistent with small bowel  obstruction.   END OF REPORT        Physical Exam:  General: NAD, resting comfortably  HEENT: NCAT, NGT in place with bilious drainage  CV: Irregularly irregular  Resp: CTAB  Abd: softly distended (less distended than yesterday), nontender, hypoactive bowel sounds, well healed midline incision  Ext: wwp, Left AKA well healed, RLE nontender and calf soft  Neuro: grossly intact    ASSESSMENT: 67 y.o. female PMH significant for CAD, CHF, COPD, CVA, VT s/p AICD, anemia, PVD and abdominal PSH consisted of a left hemicolectomy admitted with sbo. SBO had previously resolved with conservative management and patient was tolerating a regular diet; however, now with recurrent SBO. Plan is to continue conservative management with NPO and NGT decompression.      PLAN:  - Please replete lytes to keep K > 4, Mg > 2, and Phos > 3. Consider adding K to MIVF  - Cont NGT to LIWS, Flush with 60 cc NS Q shift  - NPO, IVF  - pain control and antiemetics prn  - Please check prealbumin.   - Awaiting return of bowel function. Will follow.    Please page the Division 2 Surgery Team for any questions.    Tyla Burgner LEE, MD on 01/13/2013 at 5:54 AM

## 2013-01-13 NOTE — Progress Notes (Signed)
Geriatric Medicine Progress Note    Interval History:  She feels like her abdomen is less distended. Overall feeling better today.  No BM as of yet.  No other overnight events     Intake/Output    Intake/Output Summary (Last 24 hours) at 01/13/13 1405  Last data filed at 01/13/13 1035   Gross per 24 hour   Intake 2001.67 ml   Output   1200 ml   Net 801.67 ml          Physical Exam:  Vital Signs:   Temp:  [35.9 C (96.6 F)-37 C (98.6 F)] 35.9 C (96.6 F)  Heart Rate:  [68-86] 70  Resp:  [18-20] 20  BP: (110-128)/(56-60) 128/56 mmHg     General: cooperative. NAD   HEENT:   Cardiovascular: heart RRR. s1s2 (+)   Pulmonary:   Gastrointestinal: abdomen mildly distended. Soft. Decreased bowel sounds throughout.    Skin:     Data:      Recent Labs  Lab 01/13/13  0203 01/12/13  0058 01/11/13  0127   WBC 12.6* 18.4* 10.2*   Hemoglobin 7.9* 9.4* 8.9*   Hematocrit 25* 30* 28*   Platelets 202 247 208     Other Labs:    @LABRCNTIP (NA:3,K:3,CL:3,co2:3,un:3,creat:3,gfrc:3,gfrb:3,glu:3,ca:3))@   X-rays the past 24 hours: * Portable Abdomen Standard Ap Single View/kub    01/12/2013   IMPRESSION:   Diffuse small bowel dilatation most consistent with small bowel  obstruction.   END OF REPORT       Assessment/Plan  Active Hospital Problems    Diagnosis   . SBO (small bowel obstruction)   . Hypotension   . Acute kidney injury   . Dehydration      Resolved Hospital Problems    Diagnosis   No resolved problems to display.     Patient is a 67 yo female with history of CAD, CHF, COPD, CVA, VT s/p AICD and left hemicolectomy admitted with SBO.      1) SBO  - seems to be improving  - still a fair amount of output from NG  - continue NGT and NPO   - appreciate surgery recommendations  - replace lytes     2) leukocytosis  - given improvement today with improvement in SBO, this is likely the cause  - however, will repeat CXR to rule out pneumonia  - no other current signs of infection    3) chronic systolic CHF  - euvolemic currently  -  continue amiodarone and coreg   - decrease IVF and monitor for fluid overload    4) parkinsons disease  - continue sinemet     DC Planning  Pending clinical improvement     Sue Lush, MD   01/13/2013   2:05 PM     Inpatient checklist  Have Advanced Directives been addressed? Is MOLST in chart and order placed?  Are daily labs needed?  Verify activity orders/encourage mobilization  Remove unnecessary lines (IVs, O2, catheters)  Medication reconcilliation

## 2013-01-13 NOTE — Progress Notes (Signed)
Physician Assistant Progress Note    Interval History:    Pt reports she slept well overnight, no new complaints. Feels as though distention is improving. Denies SOB, f/c, N/V. Draining green bilious fluid per NG. Electrolytes being repleted.     Intake/Output    Intake/Output Summary (Last 24 hours) at 01/13/13 1103  Last data filed at 01/13/13 1035   Gross per 24 hour   Intake 2001.67 ml   Output   1200 ml   Net 801.67 ml        Vital Signs:   Temp:  [35.9 C (96.6 F)-37.5 C (99.5 F)] 35.9 C (96.6 F)  Heart Rate:  [68-86] 70  Resp:  [16-20] 20  BP: (110-128)/(56-60) 128/56 mmHg       Physical Exam    General: Patient chronically ill appearing, mod distress   Cardiovascular: RRR   Pulmonary: Lungs are CTA, normal respiratory effort   Gastrointestinal: Abd distended, soft, NT, hypoactive BS  Ext: No crepitus or tenderness in right thigh/groin      Data:      Recent Labs  Lab 01/13/13  0203 01/12/13  0058 01/11/13  0127   WBC 12.6* 18.4* 10.2*   Hemoglobin 7.9* 9.4* 8.9*   Hematocrit 25* 30* 28*   Platelets 202 247 208     Other Labs:      Recent Labs  Lab 01/13/13  0203 01/12/13  1901 01/12/13  0058   Sodium 145 146* 144   Potassium 3.2* 3.2* 3.4   Chloride 114* 116* 110*   CO2 21 19* 20   UN 17 16 15    Creatinine 1.02* 1.01* 1.16*      X-rays the past 24 hours: * Portable Abdomen Standard Ap Single View/kub    01/12/2013   IMPRESSION:   Diffuse small bowel dilatation most consistent with small bowel  obstruction.   END OF REPORT       Assessment/Plan  Active Hospital Problems    Diagnosis   . SBO (small bowel obstruction)   . Hypotension   . Acute kidney injury   . Dehydration      Resolved Hospital Problems    Diagnosis   No resolved problems to display.     1. Leukocytosis suspected secondary to SBO   - Called surgery to consult for N/V, increasing abdominal distention and distended bowel loops on KUB   - Recommended conservative management- patient not open to surgical intervention at this point.   - Checked  a chest XR and KUB per surgery recommendations today   - Inserted NG - putting out bilious, green fluid, large amounts.   - Promethazine PRN   - Leukocytosis improving - checked (yesterday) UA, blood cultures, lactate, LFT's   - NPO   - Normal saline at 100cc/hr     2. Systolic heart failure   - Continue Amiodarone   - Continue Coreg     3. Parkinsons   - Continue Sinemet     4. FEN  - Repleted potassium, mag, phos  - NPO  - Fluids at 100cc/hr    Heparin for DVT prophylaxis   Full code       [name], PA   01/13/2013   11:03 AM

## 2013-01-13 NOTE — Plan of Care (Signed)
Pain/Comfort    . Patient's pain or discomfort is manageable Maintaining    Patient has remained free of pain throughout shift.  States pain 0/10 on 0-10 scale.  Will continue to monitor.     Safety    . Patient will remain free of falls Maintaining    Patient has remained free of falls throughout shift.  Bed alarm on, call light within reach, frequent rounding done.        Mobility    . Functional status is maintained or improved - Geriatric Progressing towards goal    Patient is a 2 assist with hoyer to chair.  Patient is a maximum assist while in bed.      Nutrition    . Nutritional status is maintained or improved - Geriatric Progressing towards goal    Patient is NPO.  Can have sips of H2O with meds.        Jonn Shingles, RN

## 2013-01-14 LAB — CBC AND DIFFERENTIAL
Baso # K/uL: 0 10*3/uL (ref 0.0–0.1)
Basophil %: 0.1 % (ref 0.1–1.2)
Eos # K/uL: 0.2 10*3/uL (ref 0.0–0.4)
Eosinophil %: 1.8 % (ref 0.7–5.8)
Hematocrit: 25 % — ABNORMAL LOW (ref 34–45)
Hemoglobin: 7.7 g/dL — ABNORMAL LOW (ref 11.2–15.7)
Lymph # K/uL: 2.3 10*3/uL (ref 1.2–3.7)
Lymphocyte %: 23.2 % (ref 19.3–51.7)
MCV: 97 fL — ABNORMAL HIGH (ref 79–95)
Mono # K/uL: 1.3 10*3/uL — ABNORMAL HIGH (ref 0.2–0.9)
Monocyte %: 13.1 % — ABNORMAL HIGH (ref 4.7–12.5)
Neut # K/uL: 6 10*3/uL (ref 1.6–6.1)
Platelets: 201 10*3/uL (ref 160–370)
RBC: 2.5 MIL/uL — ABNORMAL LOW (ref 3.9–5.2)
RDW: 18 % — ABNORMAL HIGH (ref 11.7–14.4)
Seg Neut %: 60.1 % (ref 34.0–71.1)
WBC: 10 10*3/uL (ref 4.0–10.0)

## 2013-01-14 LAB — BASIC METABOLIC PANEL
Anion Gap: 9 (ref 7–16)
Anion Gap: 10 (ref 7–16)
CO2: 21 mmol/L (ref 20–28)
CO2: 19 mmol/L — ABNORMAL LOW (ref 20–28)
Calcium: 7.9 mg/dL — ABNORMAL LOW (ref 8.6–10.2)
Calcium: 8.1 mg/dL — ABNORMAL LOW (ref 8.6–10.2)
Chloride: 119 mmol/L — ABNORMAL HIGH (ref 96–108)
Chloride: 120 mmol/L (ref 96–108)
Creatinine: 0.92 mg/dL (ref 0.51–0.95)
Creatinine: 0.87 mg/dL (ref 0.51–0.95)
GFR,Black: 74 *
GFR,Black: 79 *
GFR,Caucasian: 64 *
GFR,Caucasian: 69 *
Glucose: 89 mg/dL (ref 60–99)
Glucose: 77 mg/dL (ref 60–99)
Lab: 14 mg/dL (ref 6–20)
Lab: 12 mg/dL (ref 6–20)
Potassium: 3.5 mmol/L (ref 3.3–5.1)
Potassium: 3.7 mmol/L (ref 3.3–5.1)
Sodium: 149 mmol/L — ABNORMAL HIGH (ref 133–145)
Sodium: 149 mmol/L — ABNORMAL HIGH (ref 133–145)

## 2013-01-14 LAB — MAGNESIUM: Magnesium: 1.9 mEq/L (ref 1.3–2.1)

## 2013-01-14 LAB — POCT GLUCOSE
Glucose POCT: 76 mg/dL (ref 60–99)
Glucose POCT: 79 mg/dL (ref 60–99)
Glucose POCT: 88 mg/dL (ref 60–99)
Glucose POCT: 93 mg/dL (ref 60–99)
Glucose POCT: 94 mg/dL (ref 60–99)
Glucose POCT: 95 mg/dL (ref 60–99)

## 2013-01-14 LAB — ALBUMIN: Albumin: 2.5 g/dL — ABNORMAL LOW (ref 3.5–5.2)

## 2013-01-14 LAB — PHOSPHORUS: Phosphorus: 2.8 mg/dL (ref 2.7–4.5)

## 2013-01-14 LAB — PREALBUMIN: Prealbumin: 12 mg/dL — ABNORMAL LOW (ref 20–40)

## 2013-01-14 MED ORDER — D5W & 0.45% NACL IV SOLN *I*
75.0000 mL/h | INTRAVENOUS | Status: DC
Start: 2013-01-14 — End: 2013-01-15
  Administered 2013-01-14 (×2): 75 mL/h via INTRAVENOUS

## 2013-01-14 NOTE — Progress Notes (Signed)
Physician Assistant Progress Note    Interval History:  Pt denies CP, SOB, f/c, pain and any new concerns. She continues to put out large amounts of green fluid from her NG. She feels as though she is improving. Talked to surgery today - they are planning to further discuss the need for surgical intervention with patient.     Intake/Output    Intake/Output Summary (Last 24 hours) at 01/14/13 1455  Last data filed at 01/14/13 1327   Gross per 24 hour   Intake 2051.67 ml   Output    775 ml   Net 1276.67 ml        Vital Signs:   Temp:  [36.1 C (97 F)-36.9 C (98.4 F)] 36.1 C (97 F)  Heart Rate:  [63-72] 63  Resp:  [20] 20  BP: (110-122)/(52-62) 122/56 mmHg       Physical Exam    General: Patient chronically ill appearing, mod distress   Cardiovascular: RRR   Pulmonary: Lungs are CTA, normal respiratory effort   Gastrointestinal: Abd distended, soft, NT, hypoactive BS   Ext: No crepitus or tenderness in right thigh/groin    Data:      Recent Labs  Lab 01/14/13  0038 01/13/13  0203 01/12/13  0058   WBC 10.0 12.6* 18.4*   Hemoglobin 7.7* 7.9* 9.4*   Hematocrit 25* 25* 30*   Platelets 201 202 247     Other Labs:      Recent Labs  Lab 01/14/13  0038 01/13/13  1739 01/13/13  0203   Sodium 149* 148* 145   Potassium 3.5 3.4 3.2*   Chloride 119* 117* 114*   CO2 21 20 21    UN 14 14 17    Creatinine 0.92 0.91 1.02*      X-rays the past 24 hours: * Abdomen Standard Ap Single View/kub    01/13/2013   IMPRESSION:   Multiple dilated loops of small bowel most consistent with small  bowel obstruction not significantly changed.   END OF REPORT    *chest Standard Frontal And Lateral Views    01/13/2013   IMPRESSION:   Bibasilar consolidation which could relate to atelectasis or  pneumonia.   END OF REPORT    * Portable Abdomen Standard Ap Single View/kub    01/12/2013   IMPRESSION:   Diffuse small bowel dilatation most consistent with small bowel  obstruction.   END OF REPORT       Assessment/Plan  Active Hospital Problems    Diagnosis    . SBO (small bowel obstruction)   . Hypotension   . Acute kidney injury   . Dehydration      Resolved Hospital Problems    Diagnosis   No resolved problems to display.     1. Leukocytosis suspected secondary to SBO   - Called surgery to consult for N/V, increasing abdominal distention and distended bowel loops on KUB   - Recommended conservative management- patient not open to surgical intervention at this point, surgery will further discuss.  - Checked a chest XR and KUB per surgery recommendations today, Chest XR showed PNA vs atelectasis: Will hold on tx as pt is afebrile with no WBC count.   - Inserted NG - putting out bilious, green fluid, large amounts.   - Promethazine PRN   - NPO   - Normal saline at 100cc/hr     2. Systolic heart failure   - Continue Amiodarone   - Continue Coreg     3.  Parkinsons   - Continue Sinemet     4. FEN   - Repleted potassium, mag, phos yesterday   - NPO   - Fluids at 75 cc/hr     Heparin for DVT prophylaxis   Full code       [name], PA   01/14/2013   2:55 PM

## 2013-01-14 NOTE — Progress Notes (Addendum)
General Surgery    Subjective     Still without flatus, BM. CXR yesterday--?pneumonia versus pulmonary edema. KUB shows persistent dilated bowel. Denies nausea, vomiting.     Objective     Temp (24hrs), Avg:36.4 C (97.6 F), Min:35.9 C (96.6 F), Max:36.9 C (98.4 F)      I/O last 3 completed shifts:  12/02 0700 - 12/03 0659  In: 1492.9 (26 mL/kg) [P.O.:50; I.V.:1292.9 (0.9 mL/kg/hr); IV Piggyback:150]  Out: 1375 (23.9 mL/kg) [Emesis/NG output:1375]  Net: 117.9  Weight used: 57.5 kg    Physical Examination:    Blood pressure 118/60, pulse 65, temperature 36.6 C (97.9 F), temperature source Oral, resp. rate 20, height 1.6 m (5\' 3" ), weight 57.516 kg (126 lb 12.8 oz), SpO2 97.00%.    General: NAD, comfortable.   HEENT: NGT in place, LIWS, bilious output.   Pulm/Lungs:  Clear bilaterally.   CV: RRR  GI/Abdomen: softly distended. Minimally tender.     Labs:    Recent Labs  Lab 01/14/13  0038 01/13/13  1739 01/13/13  0203   Sodium 149* 148* 145   Potassium 3.5 3.4 3.2*   Chloride 119* 117* 114*   CO2 21 20 21    UN 14 14 17    Creatinine 0.92 0.91 1.02*   Glucose 89 91 81   Calcium 7.9* 8.3* 8.4*   Magnesium 1.9  --  1.3   Phosphorus 2.8  --  1.4*      Recent Labs  Lab 01/14/13  0038 01/13/13  0203 01/12/13  0921   WBC 10.0 12.6*  --    Hemoglobin 7.7* 7.9*  --    Hematocrit 25* 25*  --    Platelets 201 202  --    Protime  --   --  CANCELED   INR  --   --  CANCELED      Recent Labs  Lab 01/14/13  0038 01/13/13  0203 01/12/13  0058   AST  --   --  18   ALT  --   --  5   Albumin 2.5* 2.7* 3.0*   Prealbumin  --  12*  --         Assessment and Plan     67 y.o. year old female with PMH significant for CAD, CHF, COPD, CVA, VT s/p AICD, anemia, PVD and abdominal PSH consisted of a left hemicolectomy admitted with sbo. SBO had previously resolved with conservative management and patient was tolerating a regular diet; however, now with recurrent SBO.     -continue NGT to LIWS.   -SBO persistent despite decompression. Will  discuss further with attending. May require surgical intervention at some point during this hospitalization. She does not require an immediate operation at this time.   -continue NPO.   -IVF for hydration.   -replete electrolytes prn.     Everlene Other, MD  01/14/2013 9:06 AM          I saw and evaluated the patient. I agree with the resident's/fellow's findings and plan of care as documented above. Details of my evaluation are as follows: bowel obstruction not resolving.  Trying to contact familoy for two days without luck.  Will need surgery, not urgent.      Roe Rutherford, MD

## 2013-01-14 NOTE — Progress Notes (Signed)
Geriatric Medicine Progress Note    Interval History:  Feeling better today.  No BM or flatus as of yet.  Abdominal pain improving.  Denies any other complaints. NG tube in place     Intake/Output    Intake/Output Summary (Last 24 hours) at 01/14/13 1141  Last data filed at 01/13/13 2259   Gross per 24 hour   Intake 1492.92 ml   Output    775 ml   Net 717.92 ml          Physical Exam:  Vital Signs:   Temp:  [36.1 C (97 F)-36.9 C (98.4 F)] 36.1 C (97 F)  Heart Rate:  [63-72] 63  Resp:  [20] 20  BP: (110-122)/(52-62) 122/56 mmHg     General: cooperative. NAD   HEENT:   Cardiovascular: heart RRR. s1s2 (+)   Pulmonary:   Gastrointestinal: abdomen less distended. Bowel sounds present but decreased. Soft.   Skin:     Data:      Recent Labs  Lab 01/14/13  0038 01/13/13  0203 01/12/13  0058   WBC 10.0 12.6* 18.4*   Hemoglobin 7.7* 7.9* 9.4*   Hematocrit 25* 25* 30*   Platelets 201 202 247     Other Labs:    @LABRCNTIP (NA:3,K:3,CL:3,co2:3,un:3,creat:3,gfrc:3,gfrb:3,glu:3,ca:3))@   X-rays the past 24 hours: * Abdomen Standard Ap Single View/kub    01/13/2013   IMPRESSION:   Multiple dilated loops of small bowel most consistent with small  bowel obstruction not significantly changed.   END OF REPORT    *chest Standard Frontal And Lateral Views    01/13/2013   IMPRESSION:   Bibasilar consolidation which could relate to atelectasis or  pneumonia.   END OF REPORT    * Portable Abdomen Standard Ap Single View/kub    01/12/2013   IMPRESSION:   Diffuse small bowel dilatation most consistent with small bowel  obstruction.   END OF REPORT       Assessment/Plan  Active Hospital Problems    Diagnosis   . SBO (small bowel obstruction)   . Hypotension   . Acute kidney injury   . Dehydration      Resolved Hospital Problems    Diagnosis   No resolved problems to display.     Patient is a 67 yo female with multiple medical problems including CAD, CHF VT s/p AICD and left hemicolectomy admitted with SBO     1) SBO  - continues to slowly  improve  - continue NG tube for now  - appreciate surgery recommendations  - ? Need for surgical management if not improving     2) leukocytosis  - continues to improve  - no other signs or symptoms of infection (fever, etc.)  - no current signs of pneumonia  - likely due to SBO and acute obstruction    3) hypernatremia  - mild  - will change IVF to D5 1/2 NS     4) anemia  - chronic  - Hct stable and no need for transfusion currently  - monitor CBC     DC Planning  Pending clinical improvement     Sue Lush, MD   01/14/2013   11:41 AM     Inpatient checklist  Have Advanced Directives been addressed? Is MOLST in chart and order placed?  Are daily labs needed?  Verify activity orders/encourage mobilization  Remove unnecessary lines (IVs, O2, catheters)  Medication reconcilliation

## 2013-01-15 LAB — CBC AND DIFFERENTIAL
Baso # K/uL: 0 10*3/uL (ref 0.0–0.1)
Baso # K/uL: 0 10*3/uL (ref 0.0–0.1)
Basophil %: 0.1 % (ref 0.1–1.2)
Basophil %: 0.1 % (ref 0.1–1.2)
Eos # K/uL: 0.2 10*3/uL (ref 0.0–0.4)
Eos # K/uL: 0.2 10*3/uL (ref 0.0–0.4)
Eosinophil %: 2.2 % (ref 0.7–5.8)
Eosinophil %: 2.4 % (ref 0.7–5.8)
Hematocrit: 26 % — ABNORMAL LOW (ref 34–45)
Hematocrit: 26 % — ABNORMAL LOW (ref 34–45)
Hemoglobin: 8 g/dL — ABNORMAL LOW (ref 11.2–15.7)
Hemoglobin: 8.1 g/dL — ABNORMAL LOW (ref 11.2–15.7)
Lymph # K/uL: 2.2 10*3/uL (ref 1.2–3.7)
Lymph # K/uL: 2.7 10*3/uL (ref 1.2–3.7)
Lymphocyte %: 23.5 % (ref 19.3–51.7)
Lymphocyte %: 29.5 % (ref 19.3–51.7)
MCH: 30 pg (ref 26–32)
MCHC: 31 g/dL — ABNORMAL LOW (ref 32–36)
MCV: 97 fL — ABNORMAL HIGH (ref 79–95)
MCV: 98 fL — ABNORMAL HIGH (ref 79–95)
Mono # K/uL: 0.9 10*3/uL (ref 0.2–0.9)
Mono # K/uL: 1 10*3/uL — ABNORMAL HIGH (ref 0.2–0.9)
Monocyte %: 10.1 % (ref 4.7–12.5)
Monocyte %: 10.2 % (ref 4.7–12.5)
Neut # K/uL: 5.1 10*3/uL (ref 1.6–6.1)
Neut # K/uL: 5.8 10*3/uL (ref 1.6–6.1)
Platelets: 192 10*3/uL (ref 160–370)
Platelets: 194 10*3/uL (ref 160–370)
RBC: 2.7 MIL/uL — ABNORMAL LOW (ref 3.9–5.2)
RBC: 2.7 MIL/uL — ABNORMAL LOW (ref 3.9–5.2)
RDW: 17.8 % — ABNORMAL HIGH (ref 11.7–14.4)
RDW: 18.1 % — ABNORMAL HIGH (ref 11.7–14.4)
Seg Neut %: 56.1 % (ref 34.0–71.1)
Seg Neut %: 62.4 % (ref 34.0–71.1)
WBC: 9.1 10*3/uL (ref 4.0–10.0)
WBC: 9.3 10*3/uL (ref 4.0–10.0)

## 2013-01-15 LAB — MAGNESIUM: Magnesium: 1.4 mEq/L (ref 1.3–2.1)

## 2013-01-15 LAB — BASIC METABOLIC PANEL
Anion Gap: 8 (ref 7–16)
Anion Gap: 8 (ref 7–16)
CO2: 21 mmol/L (ref 20–28)
CO2: 20 mmol/L (ref 20–28)
Calcium: 7.8 mg/dL — ABNORMAL LOW (ref 8.6–10.2)
Calcium: 8.1 mg/dL — ABNORMAL LOW (ref 8.6–10.2)
Chloride: 121 mmol/L (ref 96–108)
Chloride: 118 mmol/L — ABNORMAL HIGH (ref 96–108)
Creatinine: 0.89 mg/dL (ref 0.51–0.95)
Creatinine: 0.85 mg/dL (ref 0.51–0.95)
GFR,Black: 77 *
GFR,Black: 82 *
GFR,Caucasian: 67 *
GFR,Caucasian: 71 *
Glucose: 92 mg/dL (ref 60–99)
Glucose: 89 mg/dL (ref 60–99)
Lab: 11 mg/dL (ref 6–20)
Lab: 10 mg/dL (ref 6–20)
Potassium: 3.3 mmol/L (ref 3.3–5.1)
Potassium: 4.1 mmol/L (ref 3.3–5.1)
Sodium: 150 mmol/L — ABNORMAL HIGH (ref 133–145)
Sodium: 146 mmol/L — ABNORMAL HIGH (ref 133–145)

## 2013-01-15 LAB — EKG 12-LEAD
P: -62 degrees
P: 0 degrees
P: 226 degrees
QRS: -29 degrees
QRS: 244 degrees
QRS: 252 degrees
Rate: 70 {beats}/min
Rate: 70 {beats}/min
Rate: 73 {beats}/min
Severity: ABNORMAL
Severity: ABNORMAL
Severity: ABNORMAL
Severity: ABNORMAL
Severity: ABNORMAL
Severity: ABNORMAL
T: 104 degrees
T: 111 degrees
T: 73 degrees

## 2013-01-15 LAB — ALBUMIN: Albumin: 2.4 g/dL — ABNORMAL LOW (ref 3.5–5.2)

## 2013-01-15 LAB — POCT GLUCOSE
Glucose POCT: 83 mg/dL (ref 60–99)
Glucose POCT: 83 mg/dL (ref 60–99)
Glucose POCT: 86 mg/dL (ref 60–99)
Glucose POCT: 89 mg/dL (ref 60–99)
Glucose POCT: 90 mg/dL (ref 60–99)

## 2013-01-15 LAB — PHOSPHORUS: Phosphorus: 2.4 mg/dL — ABNORMAL LOW (ref 2.7–4.5)

## 2013-01-15 LAB — TYPE AND SCREEN
ABO RH Blood Type: A POS
Antibody Screen: NEGATIVE

## 2013-01-15 LAB — PROTIME-INR
INR: 1.1 (ref 1.0–1.2)
Protime: 11.7 s (ref 9.2–12.3)

## 2013-01-15 MED ORDER — AMPICILLIN-SULBACTAM IN NS 3 GM *I*
3000.0000 mg | INTRAMUSCULAR | Status: DC
Start: 2013-01-16 — End: 2013-01-16
  Filled 2013-01-15 (×2): qty 100

## 2013-01-15 MED ORDER — POTASSIUM CHLORIDE 10 MEQ/50ML IV SOLN *I*
10.0000 meq | INTRAVENOUS | Status: AC
Start: 2013-01-15 — End: 2013-01-15
  Administered 2013-01-15 (×3): 10 meq via INTRAVENOUS

## 2013-01-15 MED ORDER — MAGNESIUM SULFATE 2GM IN 50ML STERILE WATER *A*
2000.0000 mg | Freq: Once | INTRAMUSCULAR | Status: AC
Start: 2013-01-15 — End: 2013-01-15
  Administered 2013-01-15: 2000 mg via INTRAVENOUS
  Filled 2013-01-15: qty 50

## 2013-01-15 MED ORDER — D5W & 0.45% NACL IV SOLN *I*
100.0000 mL/h | INTRAVENOUS | Status: DC
Start: 2013-01-15 — End: 2013-01-16
  Administered 2013-01-15 – 2013-01-16 (×4): 100 mL/h via INTRAVENOUS

## 2013-01-15 NOTE — Progress Notes (Signed)
At the request of PA I called the [facility] and inquired about patient's family contacts/HCP. The only person they have on file is Georgiann Cocker, [phone]. Per PA we already had this info.    Tanja Port, LMSW

## 2013-01-15 NOTE — Progress Notes (Signed)
General Surgery    Consent obtained for surgery tomorrow. Will plan on diagnostic laparoscopy, possible exploratory laparotomy, possible bowel resection for small bowel obstruction.     Verbal consent obtained as patient is unable to write secondary to CVA in the past. Consent witnessed by Jake Michaelis, RN. Consent form signed and placed in chart.     Will continue to contact family prior to OR to provide updates on patient status. Up to now, despite multiple phone calls by surgical and medical teams we have not been able to reach any family members.     Appreciate efforts and care by medical team. Continue electrolyte repletion and obtain IV access in preparation for OR tomorrow.     Everlene Other, MD   2:56 PM

## 2013-01-15 NOTE — Progress Notes (Signed)
General Surgery    Subjective     No acute events.      Objective     Temp (24hrs), Avg:36.4 C (97.5 F), Min:36.1 C (96.9 F), Max:36.9 C (98.4 F)      I/O last 3 completed shifts:  12/02 2300 - 12/03 2259  In: 1198.8 (20.8 mL/kg) [I.V.:1198.8 (0.9 mL/kg/hr)]  Out: 90 (1.6 mL/kg) [Emesis/NG output:90]  Net: 1108.8  Weight used: 57.5 kg    Physical Examination:    Blood pressure 118/54, pulse 60, temperature 36.1 C (96.9 F), temperature source Axillary, resp. rate 20, height 1.6 m (5\' 3" ), weight 57.516 kg (126 lb 12.8 oz), SpO2 100.00%.    General: NAD, comfortable. Sleeping comfortably.   HEENT: NGT to LIWS, bilious output.   Pulm/Lungs:  Clear anteriorly.   CV: RRR  GI/Abdomen: remains softly distended. Non tender. Tympanitic.     Labs:    Recent Labs  Lab 01/14/13  1840 01/14/13  0038  01/13/13  0203   Sodium 149* 149*  < > 145   Potassium 3.7 3.5  < > 3.2*   Chloride 120* 119*  < > 114*   CO2 19* 21  < > 21   UN 12 14  < > 17   Creatinine 0.87 0.92  < > 1.02*   Glucose 77 89  < > 81   Calcium 8.1* 7.9*  < > 8.4*   Magnesium  --  1.9  --  1.3   Phosphorus  --  2.8  --  1.4*   < > = values in this interval not displayed.   Recent Labs  Lab 01/14/13  0038 01/13/13  0203 01/12/13  0921   WBC 10.0 12.6*  --    Hemoglobin 7.7* 7.9*  --    Hematocrit 25* 25*  --    Platelets 201 202  --    Protime  --   --  CANCELED   INR  --   --  CANCELED      Recent Labs  Lab 01/14/13  0038 01/13/13  0203 01/12/13  0058   AST  --   --  18   ALT  --   --  5   Albumin 2.5* 2.7* 3.0*   Prealbumin 12* 12*  --         Assessment and Plan     67 y.o. year old female with PMH significant for CAD, CHF, COPD, CVA, VT s/p AICD, anemia, PVD and abdominal PSH consisted of a left hemicolectomy admitted with sbo. SBO had previously resolved with conservative management and patient was tolerating a regular diet; however, now with recurrent SBO.     -continue NGT to LIWS.   -still with signs/symptoms of SBO. Given persistence of SBO,  surgical management is indicated if the patient and family desire to proceed with surgery. Surgical team has attempted contacting family without success. Will reattempt today.   -continue IVF, electrolyte repletion.     Everlene Other, MD  01/15/2013 6:44 AM

## 2013-01-15 NOTE — Progress Notes (Signed)
Physician Assistant Progress Note    Interval History:  Saw and evaluated patient today at bedside. She continues to put out copious green and bilious fluid from NG, with abdominal distension and tympany. I spent time discussing surgical prospects with patient and the need for intervention beyond conservative management (per surgery resident note.)  At first patient was hesitant and reluctant to agree to a surgery. After explaining her SBO and that we didn't see improvement, she agreed to have procedure if this was the only option for her to get better.     I confirmed this a few times before leaving, but with history of CVA there is questionable capacity and surgery is having difficult time contacting family. Discussed with Dr Izora Ribas who feels as though patient has capacity after multiple meaningful conversations with her and that we should proceed. Only number we have listed is son who wont respond to calls, I am currently trying to get a hold of a phone number for patients sister who she said is her HCP to inform her that we will proceed with surgery.     Intake/Output    Intake/Output Summary (Last 24 hours) at 01/15/13 1000  Last data filed at 01/15/13 0559   Gross per 24 hour   Intake 1423.75 ml   Output     60 ml   Net 1363.75 ml        Vital Signs:   Temp:  [36.1 C (96.9 F)-36.9 C (98.4 F)] 36.1 C (96.9 F)  Heart Rate:  [60-78] 60  Resp:  [20] 20  BP: (112-122)/(44-72) 118/54 mmHg       Physical Exam    General: Patient alert and oriented x 3, chronically ill appearing, mod distress   Cardiovascular: RRR   Pulmonary: Lungs are CTA, normal respiratory effort   Gastrointestinal: Abd distended, soft, hypoactive BS, tympanic  Ext: No crepitus or tenderness in right thigh/groin    Data:      Recent Labs  Lab 01/15/13  0800 01/14/13  0038 01/13/13  0203   WBC 9.3 10.0 12.6*   Hemoglobin 8.1* 7.7* 7.9*   Hematocrit 26* 25* 25*   Platelets 192 201 202     Other Labs:      Recent Labs  Lab 01/15/13  0800  01/14/13  1840 01/14/13  0038   Sodium 150* 149* 149*   Potassium 3.3 3.7 3.5   Chloride 121* 120* 119*   CO2 21 19* 21   UN 11 12 14    Creatinine 0.89 0.87 0.92      X-rays the past 24 hours: * Abdomen Standard Ap Single View/kub    01/13/2013   IMPRESSION:   Multiple dilated loops of small bowel most consistent with small  bowel obstruction not significantly changed.   END OF REPORT    *chest Standard Frontal And Lateral Views    01/13/2013   IMPRESSION:   Bibasilar consolidation which could relate to atelectasis or  pneumonia.   END OF REPORT       Assessment/Plan  Active Hospital Problems    Diagnosis   . SBO (small bowel obstruction)   . Hypotension   . Acute kidney injury   . Dehydration      Resolved Hospital Problems    Diagnosis   No resolved problems to display.     1.SBO  - Surgery consulted and recommend surgery after failing conservative management   - NG - putting out green copious amounts of fluid.   -  Promethazine PRN   - Leukocytosis improved  - NPO  - D5 half normal at 100cc/hr (switched fluids yesterday due to Na and Cl)    2. Systolic heart failure   - Continue Amiodarone   - Continue Coreg     3. Parkinsons   - Continue Sinemet     4. FEN   - Replete lytes PRN  - NPO   - Fluids at 100cc/hr     Heparin for DVT prophylaxis   DC Planning      [name], PA   01/15/2013   10:00 AM

## 2013-01-15 NOTE — Progress Notes (Addendum)
Geriatric Medicine Progress Note    Interval History:  Pain well controlled this AM. Still no BM or flatus passed.  No other acute issues from overnight. Continues to be NPO with NG tube to suction.     Intake/Output    Intake/Output Summary (Last 24 hours) at 01/15/13 1128  Last data filed at 01/15/13 0559   Gross per 24 hour   Intake 1423.75 ml   Output     60 ml   Net 1363.75 ml          Physical Exam:  Vital Signs:   Temp:  [36 C (96.8 F)-36.9 C (98.4 F)] 36 C (96.8 F)  Heart Rate:  [60-78] 65  Resp:  [20] 20  BP: (112-120)/(44-72) 112/60 mmHg     General: cooperative. NAD  HEENT:   Cardiovascular: heart RRR. s1s2 (+)   Pulmonary: CTAB. No rales or rhonchi   Gastrointestinal: abdomen NT. Distended. Hypoactive bowel sounds throughout   Skin:     Data:      Recent Labs  Lab 01/15/13  0800 01/14/13  0038 01/13/13  0203   WBC 9.3 10.0 12.6*   Hemoglobin 8.1* 7.7* 7.9*   Hematocrit 26* 25* 25*   Platelets 192 201 202     Other Labs:    @LABRCNTIP (NA:3,K:3,CL:3,co2:3,un:3,creat:3,gfrc:3,gfrb:3,glu:3,ca:3))@   X-rays the past 24 hours: * Abdomen Standard Ap Single View/kub    01/13/2013   IMPRESSION:   Multiple dilated loops of small bowel most consistent with small  bowel obstruction not significantly changed.   END OF REPORT    *chest Standard Frontal And Lateral Views    01/13/2013   IMPRESSION:   Bibasilar consolidation which could relate to atelectasis or  pneumonia.   END OF REPORT       Assessment/Plan  Active Hospital Problems    Diagnosis   . SBO (small bowel obstruction)   . Hypotension   . Acute kidney injury   . Dehydration      Resolved Hospital Problems    Diagnosis   No resolved problems to display.     67 yo female with multiple medical problems including CAD, CHF, VT s/p AICD and left hemicolectomy admitted with SBO.      1) SBO  - still no significant improvement   - continue NG tube to suction  - it appears that she is likely going to require a surgical intervention  - Appreciate guidance from  surgery regarding this  - today she understands that surgery is the next step and would like to pursue if absolutely necessary.   - currently she is medically optimized for surgery although she remains a high risk due to her cardiac history.      2) hypernatremia  - continues to slowly increase  - continue IVF at D5 1/2 NS at 100 cc/hr  - will change further if hypernatremia continues to worsen     3) leukocytosis   - slowly improving  - continue to monitor    4) anemia  - likely related to chronic disease  - Hct stable for now     5) goals of care - I discussed her current medical situation with her.  I explained that at this point, her obstruction does not appear to be improving enough with conservative management and surgery is the next step to try reverse this. The alternative would be to transition to a more comfort oriented approach.  She understood this.  Although she is reluctant to undergo surgery,  she understands the urgency.  She would like to pursue a surgical intervention at this time.  In the end, she understands the risks/benefits and alternative treatment options.  I do believe she has capacity to make this decision.  Will continue to try to reach family but our attempts have been unsuccessful to this point.      DC Planning  Pending clinical improvement     Sue Lush, MD   01/15/2013   11:28 AM     Inpatient checklist  Have Advanced Directives been addressed? Is MOLST in chart and order placed?  Are daily labs needed?  Verify activity orders/encourage mobilization  Remove unnecessary lines (IVs, O2, catheters)  Medication reconcilliation

## 2013-01-15 NOTE — Plan of Care (Addendum)
Problem: Altered GI function  Goal: Nutritional status maintained or improved  Outcome: Progressing towards goal  Intervention: Initiate Tube Feeding/PN  Pt is now 9 days with inadequate oral food/beverage intake related to SBO evidenced by NPO/Cl during 9 day hospitalization.   Conservative treatment failing; plan for surgery once consent is obtained.   Suggest:   1. When medically appropriate: Goal diet: low fiber with Ensure 1.5 TID.  2. If return of bowel function is delayed: suggest start parenteral nutrition.                                                     Follow Up  Nutrition Note       Last weight   01/13/13 57.516 kg (126 lb 12.8 oz)     Estimated Nutrient Needs:  Total Caloric Estimated Needs: 30 cal/kg- 1700-1850 cal./day  Needs based on (weight): Actual  Total Protein Estimated Needs: 63" ( s/p aka) 56.1kg  1.3-1.5 gm pro/kg ( for healing from recent surgery) 70- 85 gm pro/day  Needs based on (weight): Actual    67 yo F with persistent SBO; failed conservative treatment, now plan for surgery due to signs and symptoms of SBO.   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   Nutrition care plan is outlined above; will monitor diet advancement/need for nutrition support. Discussed with provider.     Vira Blanco, RD  Nutrition Services  Pager [phone] phone [phone]

## 2013-01-16 DIAGNOSIS — Z9889 Other specified postprocedural states: Secondary | ICD-10-CM

## 2013-01-16 LAB — POCT GLUCOSE
Glucose POCT: 115 mg/dL — ABNORMAL HIGH (ref 60–99)
Glucose POCT: 119 mg/dL — ABNORMAL HIGH (ref 60–99)
Glucose POCT: 81 mg/dL (ref 60–99)
Glucose POCT: 81 mg/dL (ref 60–99)
Glucose POCT: 84 mg/dL (ref 60–99)
Glucose POCT: 93 mg/dL (ref 60–99)

## 2013-01-16 LAB — CBC
Hematocrit: 24 % — ABNORMAL LOW (ref 34–45)
Hemoglobin: 7.4 g/dL — ABNORMAL LOW (ref 11.2–15.7)
MCH: 30 pg (ref 26–32)
MCHC: 31 g/dL — ABNORMAL LOW (ref 32–36)
MCV: 98 fL — ABNORMAL HIGH (ref 79–95)
Platelets: 185 10*3/uL (ref 160–370)
RBC: 2.5 MIL/uL — ABNORMAL LOW (ref 3.9–5.2)
RDW: 17.7 % — ABNORMAL HIGH (ref 11.7–14.4)
WBC: 13.2 10*3/uL — ABNORMAL HIGH (ref 4.0–10.0)

## 2013-01-16 LAB — BASIC METABOLIC PANEL
Anion Gap: 9 (ref 7–16)
CO2: 20 mmol/L (ref 20–28)
Calcium: 8.1 mg/dL — ABNORMAL LOW (ref 8.6–10.2)
Chloride: 117 mmol/L — ABNORMAL HIGH (ref 96–108)
Creatinine: 0.83 mg/dL (ref 0.51–0.95)
GFR,Black: 84 *
GFR,Caucasian: 73 *
Glucose: 84 mg/dL (ref 60–99)
Lab: 9 mg/dL (ref 6–20)
Potassium: 3.8 mmol/L (ref 3.3–5.1)
Sodium: 146 mmol/L — ABNORMAL HIGH (ref 133–145)

## 2013-01-16 LAB — BLOOD GAS, ARTERIAL
Base Excess, Arterial: -4
CO2,ART (Calc): 22 mmol/L (ref 21–28)
CO: 0.2 % (ref 0.0–1.4)
FO2 Hb, Arterial: 97 % — ABNORMAL HIGH (ref 90–95)
HCO3, Arterial: 21 mmol/L — ABNORMAL LOW (ref 22–30)
Hemoglobin: 8.3 g/dL — ABNORMAL LOW (ref 11.2–15.7)
Methemoglobin: 0.3 % (ref 0.0–1.0)
pCO2, Arterial: 36 mm Hg (ref 35–45)
pH: 7.37 (ref 7.36–7.44)
pO2,Arterial: 100 mm Hg (ref 80–100)

## 2013-01-16 LAB — MAGNESIUM: Magnesium: 1.9 mEq/L (ref 1.3–2.1)

## 2013-01-16 LAB — ALBUMIN: Albumin: 2.5 g/dL — ABNORMAL LOW (ref 3.5–5.2)

## 2013-01-16 LAB — PHOSPHORUS: Phosphorus: 2 mg/dL — ABNORMAL LOW (ref 2.7–4.5)

## 2013-01-16 MED ORDER — HEPARIN SODIUM 5000 UNIT/ML SQ *I*
5000.0000 [IU] | Freq: Three times a day (TID) | SUBCUTANEOUS | Status: DC
Start: 2013-01-16 — End: 2013-01-23
  Administered 2013-01-16 – 2013-01-23 (×20): 5000 [IU] via SUBCUTANEOUS
  Filled 2013-01-16 (×20): qty 1

## 2013-01-16 MED ORDER — HYDROMORPHONE HCL PF 1 MG/ML IJ SOLN *WRAPPED*
0.4000 mg | INTRAMUSCULAR | Status: DC | PRN
Start: 2013-01-16 — End: 2013-01-16

## 2013-01-16 MED ORDER — LIDOCAINE HCL 2 % (PF) IJ SOLN *I*
INTRAMUSCULAR | Status: AC
Start: 2013-01-16 — End: 2013-01-16
  Filled 2013-01-16: qty 5

## 2013-01-16 MED ORDER — GLYCOPYRROLATE 1 MG/5ML IJ SOLN *I*
INTRAMUSCULAR | Status: AC
Start: 2013-01-16 — End: 2013-01-16
  Filled 2013-01-16: qty 5

## 2013-01-16 MED ORDER — ONDANSETRON HCL 2 MG/ML IV SOLN *I*
4.0000 mg | Freq: Once | INTRAMUSCULAR | Status: DC | PRN
Start: 2013-01-16 — End: 2013-01-16

## 2013-01-16 MED ORDER — LACTATED RINGERS IV SOLN *I*
125.0000 mL/h | INTRAVENOUS | Status: DC
Start: 2013-01-16 — End: 2013-01-16
  Administered 2013-01-16 (×3): 125 mL/h via INTRAVENOUS

## 2013-01-16 MED ORDER — CALCIUM CHLORIDE 10 % IV SOLN *I*
INTRAVENOUS | Status: AC
Start: 2013-01-16 — End: 2013-01-16
  Filled 2013-01-16: qty 10

## 2013-01-16 MED ORDER — DEXAMETHASONE SODIUM PHOSPHATE 4 MG/ML IJ SOLN
INTRAMUSCULAR | Status: AC
Start: 2013-01-16 — End: 2013-01-16
  Filled 2013-01-16: qty 1

## 2013-01-16 MED ORDER — LACTATED RINGERS IV SOLN *I*
125.0000 mL/h | INTRAVENOUS | Status: DC
Start: 2013-01-16 — End: 2013-01-17
  Administered 2013-01-16 – 2013-01-17 (×12): 125 mL/h via INTRAVENOUS

## 2013-01-16 MED ORDER — ROCURONIUM BROMIDE 10 MG/ML IV SOLN *WRAPPED*
Status: AC
Start: 2013-01-16 — End: 2013-01-16
  Filled 2013-01-16: qty 5

## 2013-01-16 MED ORDER — ETOMIDATE 2 MG/ML IV SOLN *I*
INTRAVENOUS | Status: AC
Start: 2013-01-16 — End: 2013-01-16
  Filled 2013-01-16: qty 10

## 2013-01-16 MED ORDER — HYDROMORPHONE HCL PF 1 MG/ML IJ SOLN *WRAPPED*
0.5000 mg | INTRAMUSCULAR | Status: DC | PRN
Start: 2013-01-16 — End: 2013-01-20

## 2013-01-16 MED ORDER — FENTANYL CITRATE 50 MCG/ML IJ SOLN *WRAPPED*
INTRAMUSCULAR | Status: AC
Start: 2013-01-16 — End: 2013-01-16
  Filled 2013-01-16: qty 5

## 2013-01-16 MED ORDER — VASOPRESSIN 20 UNIT/ML IJ SOLN *WRAPPED*
Status: AC
Start: 2013-01-16 — End: 2013-01-16
  Filled 2013-01-16: qty 1

## 2013-01-16 MED ORDER — HYDROMORPHONE HCL PF 1 MG/ML IJ SOLN *WRAPPED*
0.5000 mg | INTRAMUSCULAR | Status: AC | PRN
Start: 2013-01-16 — End: 2013-01-17

## 2013-01-16 MED ORDER — ONDANSETRON HCL 2 MG/ML IV SOLN *I*
4.0000 mg | Freq: Four times a day (QID) | INTRAMUSCULAR | Status: DC | PRN
Start: 2013-01-16 — End: 2013-01-20

## 2013-01-16 MED ORDER — BUPIVACAINE-EPINEPHRINE 0.25 % IJ SOLN
INTRAMUSCULAR | Status: AC
Start: 2013-01-16 — End: 2013-01-16
  Filled 2013-01-16: qty 30

## 2013-01-16 MED ORDER — PANTOPRAZOLE SODIUM 40 MG IV SOLR *I*
40.0000 mg | INTRAVENOUS | Status: DC
Start: 2013-01-16 — End: 2013-01-20
  Administered 2013-01-16 – 2013-01-19 (×4): 40 mg via INTRAVENOUS
  Filled 2013-01-16 (×4): qty 10

## 2013-01-16 MED ORDER — NEOSTIGMINE METHYLSULFATE 1 MG/ML IJ SOLN WRAPPED *I*
Status: AC
Start: 2013-01-16 — End: 2013-01-16
  Filled 2013-01-16: qty 10

## 2013-01-16 MED ORDER — AMPICILLIN-SULBACTAM IN NS 3 GM *I*
INTRAMUSCULAR | Status: AC
Start: 2013-01-16 — End: 2013-01-16
  Administered 2013-01-16: 3000 mg via INTRAVENOUS
  Filled 2013-01-16: qty 100

## 2013-01-16 MED ORDER — METOPROLOL TARTRATE 1 MG/ML IV SOLN *I*
5.0000 mg | Freq: Four times a day (QID) | INTRAVENOUS | Status: DC
Start: 2013-01-16 — End: 2013-01-20
  Administered 2013-01-16 – 2013-01-20 (×13): 5 mg via INTRAVENOUS
  Filled 2013-01-16 (×15): qty 5

## 2013-01-16 MED ORDER — ONDANSETRON HCL 2 MG/ML IV SOLN *I*
INTRAMUSCULAR | Status: AC
Start: 2013-01-16 — End: 2013-01-16
  Filled 2013-01-16: qty 2

## 2013-01-16 MED ORDER — ALBUTEROL SULFATE (2.5 MG/3ML) 0.083% IN NEBU *I*
2.5000 mg | INHALATION_SOLUTION | Freq: Once | RESPIRATORY_TRACT | Status: DC | PRN
Start: 2013-01-16 — End: 2013-01-16

## 2013-01-16 NOTE — H&P (Signed)
Patient received in transfer from PACU to ICU Bed 3 for failed extubation after Diagnostic laparoscopy with extensive LOA.    Michelle [name] was admitted to [facility] through the ED on 01/06/2013 from [facility] for nausea, vomiting and hypotension. In ED diagnosed with SBO (prior history of same - treated conservatively). Was admitted and received  IVF. Surgery consulted. Treated conservatively with improvement. However, over the last 3 days symptoms worsened. After consent obtained patient taken to the OR today where a diagnostic laparoscopy was performed with extensive lysis of adhesions. Uncomplicated surgery.  EBL = 10 ml, Received 2300 ml crystalloid - LRS, no blood products. Extubated in PACU. However, patient had trouble maintaining her sats. Required intermittent bagging. Therefore anesthesia re-intubated and patient admitted to ICU under the surgical service with Critical Care co-managing. Arrives in ICU intubated and on ventilator.    PMH: no allergies            For current medications see orders. However on LRS,  hydromorphone for pain and zofran for nausea and                    vomiting.              Medical History includes recent completed treatment for cdiff with po flagyl at the [facility]. Completed on 11/25.            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            Patient also has CAD with CHF (not requiring diuretics), ischemic CM, episodes of VT requiring AICD,            HTN, HLD, COPD, Parkinson's Disease, PVD, remote ETOH abuse, prior hemicolectomy, anemia of chronic               Diease, anxiety and depression.  SH: ex ETOH abuse, unknown if history drug use or tobacco abuse  FH: non-contributory    ROS: Unable, intubated.    PE: T= 36.2 P=62 - paced, R= 13, BP= 115/46 (63)  A WD, chronicly ill appearing 67 yo AA female, intubate, on vent.  Skin: warm, dry, anicteric, acyanotic.  HEENT: normocephalic, pupils27mm ou- reactive, ETT/OGT, mucosa  dry, neg JVD, trachea midline  Chest: few scattered rhonchi, overbreathing vent.  CVS: AICD - left upper chest wall. S1S2 wnl, no m/g/r.  Abd: mildly distended, no BS, minimal tenderness, surgical sites intact  Ext: trace edema, recent Left AKA with dressing C/D/I  Neuro: oerns eyes to stimuli but does not interact. CN intact, moves head only    A/P   1. Post-Op Respiratory Failure       - vent overnight, PST in AM with expected extubation.       - will use prn versed for sedation  2. Post - Op diagnostic laparotomy with LOA      -w ill use LRS at 125/hr      - continue Unasyn.      - monitor I?O     - re-check Hct and lytes around midnight.     - continue hydromorphone for pain\  3. Ischemic CM,AICD/HTN     - continue meds from floor     - hold PM carvedilol till BP stabilizes.  4. Recent AKA - Left     - per Surgical Service  4. GI Prophylaxis      - protonix 40 mg IV  every day  5. VTE Prophylaxis     - Heparin 5000 units SQ q8h    Alben Spittle PA     -

## 2013-01-16 NOTE — Anesthesia Pre-procedure Eval (Signed)
Anesthesia Pre-operative Evaluation for Michelle [name]      Health History  Past Medical History   Diagnosis Date   . CVA (cerebral infarction) 10/23/2011   . ASHD (arteriosclerotic heart disease) 10/23/2011   . MI (mitral incompetence) 10/23/2011   . CHF (congestive heart failure) 10/23/2011     EF 30%   . COPD (chronic obstructive pulmonary disease) 10/23/2011   . VT (ventricular tachycardia) 10/23/2011     S/p AICD   . Anemia of chronic disease 10/23/2011   . HTN (hypertension) 10/23/2011   . Parkinsonism    . Coronary artery disease    . AICD (automatic cardioverter/defibrillator) present    . Myocardial infarction june 2013   . Alcohol abuse, in remission    . Peripheral vascular disease      Left AKA     Past Surgical History   Procedure Laterality Date   . Icd     . Hemicolectomy Left    . Cardiac defibrillator placement     . Pacemaker insertion     . Hh picc placement consult hh only  12/10/2012           Social History  History   Substance Use Topics   . Smoking status: Unknown If Ever Smoked   . Smokeless tobacco: Not on file   . Alcohol Use: No      Comment: formerly, heavy drinker      History   Drug Use Not on file     ______________________________________________________________________  Allergies: No Known Allergies (drug, envir, food or latex)  Prior to Admission Medications    Last Medication Reconciliation Action:  Requires Completion Si Raider, MD 01/06/2013  3:02 PM              Last Dose Start Date End Date Provider     Cholecalciferol (VITAMIN D3) 50000 UNITS CAPS   --  --  [provider]     HYDROcodone-acetaminophen (NORCO) 5-325 MG per tablet   --  --  [provider]     acetaminophen (TYLENOL) 500 mg tablet   07/25/12  --  Romesser, Teressa Lower, MD     Take 2 tablets (1,000 mg total) by mouth 3 times daily     amiodarone (PACERONE) 400 MG tablet   --  --  [provider]     aspirin 81 MG EC tablet   --  --  [provider]     atorvastatin (LIPITOR) 10 MG  tablet   --  --  [provider]     bisacodyl (DULCOLAX) 10 MG suppository   --  --  [provider]     calcium carbonate-vitamin D 600-400 MG-UNIT per tablet   --  --  [provider]     carbidopa-levodopa (SINEMET) 25-100 MG per tablet   --  --  [provider]     carvedilol (COREG) 6.25 MG tablet   07/25/12  --  Romesser, Teressa Lower, MD     Take 0.5 tablets (3.125 mg total) by mouth daily   Hold for sbp < 110     cyanocobalamin (VITAMIN B-12) 1000 MCG tablet   --  --  [provider]     famotidine (PEPCID) 20 MG tablet   --  --  [provider]     guaiFENesin (ROBITUSSIN) 100 MG/5ML syrup   --  --  [provider]     ipratropium-albuterol (DUONEB) 0.5-2.5 (3) MG/3ML nebulizer  solution   --  --  [provider]     potassium chloride SA (KLOR-CON M20) 20 mEq CR tablet   07/25/12  01/21/13  Romesser, Teressa Lower, MD     Take 1 tablet (20 mEq total) by mouth daily     sertraline (ZOLOFT) 50 MG tablet   --  --  [provider]     sorbitol 70 % solution   --  --  [provider]        Current Facility-Administered Medications   Medication   . ampicillin-sulbactam (UNASYN) 30 mg/ml IVPB   . Dextrose 5 %- Sodium Chloride 0.45%   . ampicillin-sulbactam (UNASYN) 30 mg/ml IVPB 3,000 mg   . miconazole (SECURA) 2 % cream   . promethazine (PHENERGAN) injection 12.5 mg   . carvedilol (COREG) tablet 3.125 mg   . famotidine (PEPCID) tablet 20 mg   . carbidopa-levodopa ER (SINEMET CR) 25-100 MG per tablet 1 tablet   . amiodarone (PACERONE) tablet 400 mg   . sertraline (ZOLOFT) tablet 50 mg   . sorbitol 70 % solution 30 mL     Admission Medications:  Scheduled Meds  . ampicillin-sulbactam     . ampicillin-sulbactam IV     . miconazole     . carvedilol 3.125 mg at 01/15/13 1816   . famotidine 20 mg at 01/16/13 1023   . carbidopa-levodopa ER 1 tablet at 01/16/13 1023   . amiodarone 400 mg at 01/16/13 1023   . sertraline 50 mg at 01/16/13 1023    IV Meds  . dextrose 5 % and 0.45% NaCl 100 mL/hr (01/16/13 1059)   PRN Meds  . promethazine 12.5 mg at 01/12/13 0641   . sorbitol       Anesthesia Evaluation Information Source: per patient, per records  GENERAL  Pertinent (-):  history of anesthetic complications, FamHx of anesthetic complications, obesity        PULMONARY    + COPD CARDIOVASCULAR  Poor<4 METS Exercise Tolerance    + Hypertension            well controlled    + Past MI    + CAD    + Dysrhythmias (h/o vtach)    + Cardiac device            AICD    + CHF  Pertinent (-):  angina    GI/HEPATIC/RENAL  Last PO Intake: >8hr before procedure      + Bowel issues (NG tube in place with significant output this hospital stay)            obstruction    + Renal issues          AKI     NEURO/PSYCH    + Cerebrovascular event          CVA, facial deficit    ENDO/OTHER  Pertinent (-):  diabetes mellitus, thyroid disease    HEMATOLOGIC  Denies hematologic issues     Nursing Reported PO Status: Date Last PO Fluids: 01/16/13 1025  Date Last PO Solids: 01/12/13    ______________________________________________________________________  Physical Exam    Airway            Mouth opening: limited            Mallampati: III            TM distance (cm): 3            Neck ROM: limited  Dental  Upper: dentures Lower: dentures   Cardiovascular           Rhythm: regular           Rate: normal  No murmur    General Survey     Comment: S/p Left AKA, patient listing to left side with neck contractures, bilateral hands with rolling tremor, facial droop left side   Pulmonary   pulmonary exam normal    + Rhonchi (coarse bilaterally, improvement with cough)   Bilateral, Improves with cough    Mental Status     oriented to person, place and time   Comment: Dysarthric speech         Most Recent Vitals: BP: 110/49 mmHg (01/16/13 1433)  BP MAP : 76 mmHg (01/08/13 1500)  Arterial Line BP 2: 137/51 mmHg (01/08/13 1300)  A- Line 2 MAP: 57 mmHg (01/08/13 1400)  Heart Rate: 70 (01/16/13  1433)  Temp: 36.2 C (97.2 F) (01/16/13 1433)  Resp: 16 (01/16/13 1433)  Height: 160 cm (5\' 3" ) (01/06/13 1405)  Weight: 57.289 kg (126 lb 4.8 oz) (01/16/13 0344)  BMI (Calculated): 22.4 (01/16/13 0344)  BSA (Calculated - sq m): 1.68 sq meters (01/06/13 1405)  SpO2: 95 % (01/16/13 1433)    Vital Sign Ranges (last 24hrs)  Temp:  [36.1 C (97 F)-36.7 C (98.1 F)] 36.2 C (97.2 F)  Heart Rate:  [63-70] 70  Resp:  [16-20] 16  BP: (110-126)/(49-72) 110/49 mmHg   O2 Device: None (Room air) (01/16/13 1433)    Most Recent Lab Results   Blood Type  Lab Results   Component Value Date    ABORH A RH POS 01/15/2013    ABS Negative 01/15/2013   CBC  Lab Results   Component Value Date    WBC 9.1 01/15/2013    HCT 26* 01/15/2013    PLT 194 01/15/2013   Chem-7  Lab Results   Component Value Date    NA 146* 01/15/2013    K 3.8 01/15/2013    CL 117* 01/15/2013    CO2 20 01/15/2013    UN 9 01/15/2013    CREAT 0.83 01/15/2013    GLU 84 01/15/2013    PGLU 93 01/16/2013   Estimated Creatinine Clearance: 59.5 ml/min (based on Cr of 0.83).  Electrolytes  Lab Results   Component Value Date    CA 8.1* 01/15/2013    MG 1.9 01/15/2013    PO4 2.0* 01/15/2013   Coags  Lab Results   Component Value Date    PTI 11.7 01/15/2013    INR 1.1 01/15/2013    PTT 28.5 01/06/2013   LFTs  Lab Results   Component Value Date    AST 18 01/12/2013    ALT 5 01/12/2013    ALK 97 01/07/2013     Bilirubin,Direct   Date Value Range Status   01/07/2013 <0.2  0.0 - 0.3 mg/dL Final        Bili,Indirect   Date Value Range Status   12/03/2012 0.4   Final        Bilirubin,Total   Date Value Range Status   01/07/2013 0.3  0.0 - 1.2 mg/dL Final     Pregnancy Status: Postmenopausal [2]  No LMP recorded. Patient is postmenopausal.    No results found for this basename: PUPT,  UPREG,  SPREG,  HCG1,  BHCG2,  BHCG,  HCGB     ECG Results  Lab Results   Component Value Date/Time    RATE 73 01/06/2013  3:13 PM    RATE 70 01/06/2013  2:58 PM    STATEMENT SINUS OR ECTOPIC ATRIAL RHYTHM 01/06/2013   3:13 PM    STATEMENT IVCD, CONSIDER ATYPICAL LBBB 01/06/2013  3:13 PM    STATEMENT CONSIDER INFERIOR INFARCT 01/06/2013  3:13 PM    STATEMENT CONSIDER ANTERIOR INFARCT 01/06/2013  3:13 PM    STATEMENT LEFT ATRIAL ENLARGEMENT 01/06/2013  3:13 PM    STATEMENT SINUS RHYTHM 01/06/2013  2:58 PM    STATEMENT LEFT ATRIAL ENLARGEMENT 01/06/2013  2:58 PM    STATEMENT CONSIDER INFERIOR INFARCT 01/06/2013  2:58 PM    STATEMENT NONSPECIFIC IVCD WITH LAD 01/06/2013  2:58 PM    STATEMENT LEFT ATRIAL ENLARGEMENT 01/06/2013  2:58 PM     ANES CPM    Radiology: No relevant studies     ________________________________________________________________________  Medical Problems  Patient Active Problem List    Diagnosis Date Noted   . SBO (small bowel obstruction) 01/06/2013   . Hypotension 01/06/2013   . Acute kidney injury 01/06/2013   . Dehydration 01/06/2013   . Peripheral vascular disease      Left AKA     . 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 Sepsis 12/03/2012   . 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 C. difficile colitis 07/25/2012   . Compression fracture of L1 lumbar vertebra 07/25/2012   . AKI (acute kidney injury) 07/15/2012   . Unspecified cerebral artery occlusion with cerebral infarction    . Coronary artery disease    . AICD (automatic cardioverter/defibrillator) present    . Hypertension    . Parkinsonism    . CVA (cerebral infarction) 10/23/2011   . ASHD (arteriosclerotic heart disease) 10/23/2011   . MI (mitral incompetence) 10/23/2011   . CHF (congestive heart failure) 10/23/2011   . COPD (chronic obstructive pulmonary disease) 10/23/2011   . ETOH abuse 10/23/2011   . VT (ventricular tachycardia) 10/23/2011     S/p AICD     . Anemia of chronic disease 10/23/2011       PreOp/PreProcedure Diagnosis (For more detail see procedural consent)            SBO  Planned Procedure (For more detail see procedural consent)            Exploratory laparoscopy  Plan   ASA Score 4  Anesthetic Plan  (general); Induction (RSI- NGT to suction prior to induction); Airway (cuffed ETT); Line ( use current access); Monitoring (standard ASA); Positioning (supine); Pain (per surgical team); PostOp (PACU or possible ICU)    Informed Consent     Risks:          Risks discussed were commensurate with the plan listed above with the following specific points:  N/V, aspiration, sore throat , damage to:(eyes, nerves, teeth), allergic Rx, unexpected serious injury, awareness, death    Anesthetic Consent:         Anesthetic plan and risks discussed with:  patient      Attending Attestation: The patient or proxy understand and accept the risks and benefits of the anesthesia plan. By accepting this note, I attest that I have personally performed the history and physical exam and prescribed the anesthetic plan within 48 hours prior to the anesthetic as documented by me above.    Author: Janelle Floor, MD

## 2013-01-16 NOTE — Progress Notes (Signed)
Physician Assistant Progress Note    Interval History:  Pt reports that she slept well overnight. Denies f/c, CP, SOB, abdominal pain or tenderness. Patients abdomen continues to be distended and tympanic. She reports that she has an appetite and hopes she can start to eat soon.     Intake/Output    Intake/Output Summary (Last 24 hours) at 01/16/13 0950  Last data filed at 01/15/13 2120   Gross per 24 hour   Intake      0 ml   Output      0 ml   Net      0 ml        Vital Signs:   Temp:  [36 C (96.8 F)-36.7 C (98.1 F)] 36.2 C (97.2 F)  Heart Rate:  [63-71] 63  Resp:  [20] 20  BP: (110-126)/(50-72) 110/50 mmHg       Physical Exam    General: Patient alert and oriented x 3, chronically ill appearing, mod distress, NG to suction with copious green fluid   Cardiovascular: RRR   Pulmonary: Lungs are CTA, normal respiratory effort   Gastrointestinal: Abd distended, soft, hypoactive BS, tympanic   Ext: No crepitus or tenderness in right thigh/groin      Data:      Recent Labs  Lab 01/15/13  2341 01/15/13  0800 01/14/13  0038   WBC 9.1 9.3 10.0   Hemoglobin 8.0* 8.1* 7.7*   Hematocrit 26* 26* 25*   Platelets 194 192 201     Other Labs:      Recent Labs  Lab 01/15/13  2342 01/15/13  1611 01/15/13  0800   Sodium 146* 146* 150*   Potassium 3.8 4.1 3.3   Chloride 117* 118* 121*   CO2 20 20 21    UN 9 10 11    Creatinine 0.83 0.85 0.89      X-rays the past 24 hours: * Abdomen Standard Ap Single View/kub    01/13/2013   IMPRESSION:   Multiple dilated loops of small bowel most consistent with small  bowel obstruction not significantly changed.   END OF REPORT    *chest Standard Frontal And Lateral Views    01/13/2013   IMPRESSION:   Bibasilar consolidation which could relate to atelectasis or  pneumonia.   END OF REPORT       Assessment/Plan  Active Hospital Problems    Diagnosis   . SBO (small bowel obstruction)   . Hypotension   . Acute kidney injury   . Dehydration      Resolved Hospital Problems    Diagnosis   No resolved  problems to display.     1.SBO   - Surgery consulted and recommend surgery after failing conservative management   - NG - putting out green copious amounts of fluid.   - Promethazine PRN   - Leukocytosis improved   - NPO   - D5 half normal at 100cc/hr (switched fluids yesterday due to Na and Cl)     2. Systolic heart failure   - Continue Amiodarone   - Continue Coreg     3. Parkinsons   - Continue Sinemet     4. FEN   - Replete lytes PRN   - NPO   - Fluids at 100cc/hr     Heparin for DVT prophylaxis   DC Planning        [name], PA   01/16/2013   9:50 AM

## 2013-01-16 NOTE — Plan of Care (Signed)
Problem: Restraints  Goal: Patient will remain safe while in restraints  Patient will remain safe from injury to self or others while in restraints  Outcome: Maintaining  Needs restraint for tube safety.

## 2013-01-16 NOTE — Progress Notes (Signed)
Pt to icu from or after 2000.  vss on transfer; see skin note below s/p exp lap.  Will run ivf/ ng to suction/ draw labs per orders.  Will monitor closely.

## 2013-01-16 NOTE — Progress Notes (Signed)
Surgery Post-Op Note    Procedure: Diagnostic laparoscopy, lysis of adhesions    Subjective:  Patient sedated. Comfortable. Minimal vent settings.    Objective:    Vital signs:  Filed Vitals:    01/16/13 2200   BP: 116/55   Pulse: 69   Temp:    Resp: 16   Height:    Weight:        Physical Exam:    Gen: NAD, sedated, ETT in place, NGT to LIWS with scant output  CV: RRR  Resp: clear bilaterally, minimal vent settings  Abd: soft, nd, incisions c/d/i  Ext: warm, well-perfused; no cyanosis; left AKA      Assessment/Plan:    67 y.o. female with SBO POD #0 status post diagnostic laparoscopy, lysis of adhesions. Failed extubation postoperatively. Doing well on minimal vent settings.    - Vent overnight  - IVF  - Heparin sq  - NGT to LIWS  - Appreciate ICU care      Sherryl Manges, MD     01/16/2013     10:47 PM

## 2013-01-16 NOTE — Progress Notes (Signed)
Geriatric Medicine Progress Note    Interval History:  No overnight events.  Denies worsening of her abdominal pain or distention.  NG tube still has a significant amount of output.      Intake/Output    Intake/Output Summary (Last 24 hours) at 01/16/13 1248  Last data filed at 01/16/13 0959   Gross per 24 hour   Intake     50 ml   Output      0 ml   Net     50 ml        Physical Exam:  Vital Signs:   Temp:  [36.1 C (97 F)-36.7 C (98.1 F)] 36.1 C (97 F)  Heart Rate:  [63-71] 67  Resp:  [20] 20  BP: (110-126)/(50-72) 122/58 mmHg     General: cooperative. NAD  HEENT:   Cardiovascular: heart RRR. s1s2 (+)   Pulmonary: lungs CTAB  Gastrointestinal: abdomen NT. Mildly distended. Decreased BS throughout.   Skin:     Data:      Recent Labs  Lab 01/15/13  2341 01/15/13  0800 01/14/13  0038   WBC 9.1 9.3 10.0   Hemoglobin 8.0* 8.1* 7.7*   Hematocrit 26* 26* 25*   Platelets 194 192 201     Other Labs:    @LABRCNTIP (NA:3,K:3,CL:3,co2:3,un:3,creat:3,gfrc:3,gfrb:3,glu:3,ca:3))@   X-rays the past 24 hours: * Abdomen Standard Ap Single View/kub    01/13/2013   IMPRESSION:   Multiple dilated loops of small bowel most consistent with small  bowel obstruction not significantly changed.   END OF REPORT    *chest Standard Frontal And Lateral Views    01/13/2013   IMPRESSION:   Bibasilar consolidation which could relate to atelectasis or  pneumonia.   END OF REPORT       Assessment/Plan  Active Hospital Problems    Diagnosis   . SBO (small bowel obstruction)   . Hypotension   . Acute kidney injury   . Dehydration      Resolved Hospital Problems    Diagnosis   No resolved problems to display.     1) SBO  - no significant improvement  - to OR today per surgery   - continue NPO and hold SQ heparin    2) hypernatremia   - improving  - continue current rate of IVF     3) anemia   - Hct currently stable  - continue to monitor   - will likely need PRBC on the way to OR     DC Planning  Pending clinical improvement     Sue Lush, MD    01/16/2013   12:48 PM     Inpatient checklist  Have Advanced Directives been addressed? Is MOLST in chart and order placed?  Are daily labs needed?  Verify activity orders/encourage mobilization  Remove unnecessary lines (IVs, O2, catheters)  Medication reconcilliation

## 2013-01-16 NOTE — Interval H&P Note (Signed)
UPDATES TO PATIENT'S CONDITION on the DAY OF SURGERY/PROCEDURE    I. Updates to Patient's Condition (to be completed by a provider privileged to complete a H&P, following reassessment of the patient by the provider):    (Inpatients only): I confirm that progress notes within the past 24 hours document updates to the patient's condition.            II. Procedure Readiness   I have reviewed the patient's H&P and updated condition. By completing and signing this form, I attest that this patient is ready for surgery/procedure.      III. Attestation   I have reviewed the updated information regarding the patient's condition and it is appropriate to proceed with the planned surgery/procedure.    Roe Rutherford, MD as of 1:47 PM 01/16/2013

## 2013-01-16 NOTE — Progress Notes (Addendum)
General Surgery    Subjective     Long discussion with patient yesterday. Agreeable to surgery. Overnight, 2 BMs. +flatus.     Objective     Temp (24hrs), Avg:36.3 C (97.4 F), Min:36 C (96.8 F), Max:36.7 C (98.1 F)           Physical Examination:    Blood pressure 110/50, pulse 63, temperature 36.2 C (97.2 F), temperature source Temporal, resp. rate 20, height 1.6 m (5\' 3" ), weight 57.289 kg (126 lb 4.8 oz), SpO2 92.00%.    General: NAD, comfortable.   HEENT: NGT to LIWS, scant thin output.   Pulm/Lungs:  Clear anteriorly.   CV: RRR  GI/Abdomen: soft, decreased distension. Few bowel sounds. Decreased tympany.     Labs:    Recent Labs  Lab 01/15/13  2342 01/15/13  1611 01/15/13  0800   Sodium 146* 146* 150*   Potassium 3.8 4.1 3.3   Chloride 117* 118* 121*   CO2 20 20 21    UN 9 10 11    Creatinine 0.83 0.85 0.89   Glucose 84 89 92   Calcium 8.1* 8.1* 7.8*   Magnesium 1.9  --  1.4   Phosphorus 2.0*  --  2.4*      Recent Labs  Lab 01/15/13  2341 01/15/13  2328 01/15/13  0800  01/12/13  0921   WBC 9.1  --  9.3  < >  --    Hemoglobin 8.0*  --  8.1*  < >  --    Hematocrit 26*  --  26*  < >  --    Platelets 194  --  192  < >  --    Protime  --  11.7  --   --  CANCELED   INR  --  1.1  --   --  CANCELED   < > = values in this interval not displayed.   Recent Labs  Lab 01/15/13  2342 01/15/13  0800 01/14/13  0038 01/13/13  0203 01/12/13  0058   AST  --   --   --   --  18   ALT  --   --   --   --  5   Albumin 2.5* 2.4* 2.5* 2.7* 3.0*   Prealbumin  --   --  12* 12*  --         Assessment and Plan     67 y.o. year old female with PMH significant for CAD, CHF, COPD, CVA, VT s/p AICD, anemia, PVD and abdominal PSH consisted of a left hemicolectomy admitted with sbo. SBO had previously resolved with conservative management and patient was tolerating a regular diet; however, now with recurrent SBO.     -continue NGT to LIWS.   -abdominal exam slightly improved, however still with high suspicion for ongoing point of  obstruction. Will discuss with Dr. Laural Benes, but will likely still proceed with diagnostic laparoscopy at a minimum given that she had this exact clinical presentation last week with immediate failure.   -continue IVF, electrolyte repletion. Improved today.  -consent obtained yestereday and placed in chart.     Everlene Other, MD  01/16/2013 7:36 AM          I saw and evaluated the patient. I agree with the resident's/fellow's findings and plan of care as documented above. Details of my evaluation are as follows: patient passing some gas and loose stool.  abd soft and tympanitic in right abdomen.  Fullness right abdomen.  Given very similar resolution and reoccurence I feel that operation is best option.  Patient is aware and agrees to surgery.  We have not been able to get in touch with family.  Messages have been left.  To OR today.      Roe Rutherford, MD

## 2013-01-16 NOTE — Plan of Care (Signed)
Mrs. Hiott is alert and oriented, she is able to make her needs known. She is NPO with NG to LIS with greenish brown output, less than 50 ml this shift. Medications administered with sips of H2O. She denies pain or discomfort, abdomen is soft and non tender with bowel sounds in all four quads. She is incontinent of bowel and bladder, she did not have a BM this shift. She needs to be repositioned as needed while in bed. She was transferred to the OR at 1430. Jettie Booze, RN  Bowel Elimination    . Elimination patterns are normal or improving Progressing towards goal        Mobility    . Functional status is maintained or improved - Geriatric Progressing towards goal        Nutrition    . Nutritional status is maintained or improved - Geriatric Progressing towards goal

## 2013-01-16 NOTE — Procedures (Signed)
ABG done Rt radial vent 450 R 14 40% +5  Bleeding controlled and sample sent to the lab.

## 2013-01-16 NOTE — Progress Notes (Signed)
01/16/2013 6:57 PM Pt admitted to PACU at 1801.  Pt with poor respiratory effort upon admission.  Dr. Ferne Coe provided respiratory support via an ambu bag with 10 Liters oxygen.  Nasal trumpet placed to assist airway.  Pt's respiratory effort supported periodically by Dr. Ferne Coe with AMBU bag.  O2 Sats only maintaining in the upper 80's.  Dr. Ferne Coe reintubated pt at 1843.  Pt currently tolerating vent settings.  Will transport to ICU when able. Langley Gauss, RN

## 2013-01-16 NOTE — Progress Notes (Signed)
General Surgery     Patient taken to OR today for diagnostic laparoscopy, extensive lysis of adhesions. Tolerated surgery well. Attempted to reach family to update regarding surgery. Will continue to attempt reaching family.     Patient to be transferred to surgical service under Dr. Laural Benes.     Everlene Other, MD   6:45 PM

## 2013-01-16 NOTE — Interdisciplinary Rounds (Signed)
Interdisciplinary Rounds Note    Date: 01/16/2013   Time: 12:04 PM   Attendance:  Care Coordinator, Physical Therapist and Social Worker    Admit Date/Time:  01/06/2013  6:37 PM    Principal Problem: SBO (small bowel obstruction)  Problem List:   Patient Active Problem List    Diagnosis Date Noted   . SBO (small bowel obstruction) 01/06/2013   . Hypotension 01/06/2013   . Acute kidney injury 01/06/2013   . Dehydration 01/06/2013   . Peripheral vascular disease      Left AKA     . 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 Sepsis 12/03/2012   . 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 C. difficile colitis 07/25/2012   . Compression fracture of L1 lumbar vertebra 07/25/2012   . AKI (acute kidney injury) 07/15/2012   . Unspecified cerebral artery occlusion with cerebral infarction    . Coronary artery disease    . AICD (automatic cardioverter/defibrillator) present    . Hypertension    . Parkinsonism    . CVA (cerebral infarction) 10/23/2011   . ASHD (arteriosclerotic heart disease) 10/23/2011   . MI (mitral incompetence) 10/23/2011   . CHF (congestive heart failure) 10/23/2011   . COPD (chronic obstructive pulmonary disease) 10/23/2011   . ETOH abuse 10/23/2011   . VT (ventricular tachycardia) 10/23/2011     S/p AICD     . Anemia of chronic disease 10/23/2011       The patient's problem list and interdisciplinary care plan was reviewed.    Discharge Planning  Lives in: SNF              *Does patient currently have home care services?: No     *Current External Services: None                      Plan    Anticipated Discharge Date:     Discharge Disposition: SNF Long Term Care

## 2013-01-16 NOTE — INTERIM OP NOTE (Signed)
Interim Op Note    Date of Surgery: 01/16/2013    Surgeon: Laural Benes    Co-Surgeon:   First Assistant: Everlene Other, MD     Second Assistant:     Pre-Op Diagnosis: small bowel obstruction    Anesthesia Type: General    Post-Op Diagnosis:    Primary: same  Secondary:   Tertiary:     Additional Findings (Including unexpected complications): none    Procedure(s) Performed (including CPT 4 Code if available)   diagnostic laparoscopy  Extensive lysis of adhesions    Estimated Blood Loss: 5 cc   Packing: No  Drains: No  Fluid Totals: Intakes: 2300cc Outputs: 100cc  Specimens to Pathology: no  Patient Condition: good    Everlene Other, MD   6:14 PM

## 2013-01-17 LAB — POCT GLUCOSE
Glucose POCT: 113 mg/dL — ABNORMAL HIGH (ref 60–99)
Glucose POCT: 78 mg/dL (ref 60–99)
Glucose POCT: 84 mg/dL (ref 60–99)
Glucose POCT: 86 mg/dL (ref 60–99)

## 2013-01-17 LAB — BASIC METABOLIC PANEL
Anion Gap: 9 (ref 7–16)
Anion Gap: 9 (ref 7–16)
CO2: 19 mmol/L — ABNORMAL LOW (ref 20–28)
CO2: 20 mmol/L (ref 20–28)
Calcium: 8.7 mg/dL (ref 8.6–10.2)
Calcium: 9 mg/dL (ref 8.6–10.2)
Chloride: 116 mmol/L — ABNORMAL HIGH (ref 96–108)
Chloride: 116 mmol/L — ABNORMAL HIGH (ref 96–108)
Creatinine: 0.88 mg/dL (ref 0.51–0.95)
Creatinine: 0.91 mg/dL (ref 0.51–0.95)
GFR,Black: 75 *
GFR,Black: 78 *
GFR,Caucasian: 65 *
GFR,Caucasian: 68 *
Glucose: 103 mg/dL — ABNORMAL HIGH (ref 60–99)
Glucose: 131 mg/dL — ABNORMAL HIGH (ref 60–99)
Lab: 8 mg/dL (ref 6–20)
Lab: 9 mg/dL (ref 6–20)
Potassium: 4.6 mmol/L (ref 3.3–5.1)
Potassium: 4.7 mmol/L (ref 3.3–5.1)
Sodium: 144 mmol/L (ref 133–145)
Sodium: 145 mmol/L (ref 133–145)

## 2013-01-17 LAB — PHOSPHORUS: Phosphorus: 3 mg/dL (ref 2.7–4.5)

## 2013-01-17 LAB — BLOOD CULTURE
Bacterial Blood Culture: 0
Bacterial Blood Culture: 0

## 2013-01-17 LAB — CBC AND DIFFERENTIAL
Baso # K/uL: 0 10*3/uL (ref 0.0–0.1)
Basophil %: 0.1 % (ref 0.1–1.2)
Eos # K/uL: 0 10*3/uL (ref 0.0–0.4)
Eosinophil %: 0.1 % — ABNORMAL LOW (ref 0.7–5.8)
Hematocrit: 23 % — ABNORMAL LOW (ref 34–45)
Hemoglobin: 7.1 g/dL — ABNORMAL LOW (ref 11.2–15.7)
Lymph # K/uL: 1.7 10*3/uL (ref 1.2–3.7)
Lymphocyte %: 13 % — ABNORMAL LOW (ref 19.3–51.7)
MCH: 30 pg (ref 26–32)
MCHC: 31 g/dL — ABNORMAL LOW (ref 32–36)
MCV: 97 fL — ABNORMAL HIGH (ref 79–95)
Mono # K/uL: 0.6 10*3/uL (ref 0.2–0.9)
Monocyte %: 4.6 % — ABNORMAL LOW (ref 4.7–12.5)
Neut # K/uL: 10.3 10*3/uL — ABNORMAL HIGH (ref 1.6–6.1)
Platelets: 199 10*3/uL (ref 160–370)
RBC: 2.4 MIL/uL — ABNORMAL LOW (ref 3.9–5.2)
RDW: 17.6 % — ABNORMAL HIGH (ref 11.7–14.4)
Seg Neut %: 80.9 % — ABNORMAL HIGH (ref 34.0–71.1)
WBC: 12.7 10*3/uL — ABNORMAL HIGH (ref 4.0–10.0)

## 2013-01-17 LAB — MAGNESIUM: Magnesium: 1.2 mEq/L — ABNORMAL LOW (ref 1.3–2.1)

## 2013-01-17 MED ORDER — LACTATED RINGERS IV BOLUS *I*
1000.0000 mL | Freq: Once | INTRAVENOUS | Status: AC
Start: 2013-01-17 — End: 2013-01-17
  Administered 2013-01-17: 1000 mL via INTRAVENOUS

## 2013-01-17 MED ORDER — FUROSEMIDE 10 MG/ML IJ SOLN *I*
20.0000 mg | Freq: Once | INTRAMUSCULAR | Status: AC
Start: 2013-01-17 — End: 2013-01-17
  Administered 2013-01-17: 20 mg via INTRAVENOUS
  Filled 2013-01-17: qty 2

## 2013-01-17 MED ORDER — MAGNESIUM SULFATE 2GM IN 50ML STERILE WATER *A*
2000.0000 mg | Freq: Once | INTRAMUSCULAR | Status: AC
Start: 2013-01-17 — End: 2013-01-17
  Administered 2013-01-17: 2000 mg via INTRAVENOUS
  Filled 2013-01-17: qty 50

## 2013-01-17 MED ORDER — IPRATROPIUM-ALBUTEROL 0.5-2.5 MG/3ML IN SOLN *I*
3.0000 mL | Freq: Four times a day (QID) | RESPIRATORY_TRACT | Status: DC
Start: 2013-01-17 — End: 2013-01-23
  Administered 2013-01-17 – 2013-01-23 (×24): 3 mL via RESPIRATORY_TRACT
  Filled 2013-01-17 (×24): qty 3

## 2013-01-17 MED ORDER — SODIUM CHLORIDE 0.9 % IV SOLN WRAPPED *I*
3.0000 mL/h | Status: DC
Start: 2013-01-17 — End: 2013-01-18
  Administered 2013-01-17 – 2013-01-18 (×17): 3 mL/h via INTRAVENOUS

## 2013-01-17 NOTE — Progress Notes (Signed)
CPAP/PS 5/5, Vt 300, RR 18, HR 71

## 2013-01-17 NOTE — Progress Notes (Signed)
ICU Daily Progress Note for Inpatients   LOS: 11 days       Interval History:   S/P Diagnostic laparoscopy with extensive LOA POD #1    Subjective:  Awake and alert  Pain controlled    Objective Section:  Temp:  [36 C (96.8 F)-37.6 C (99.7 F)] 37.6 C (99.7 F)  Heart Rate:  [43-75] 63  Resp:  [14-20] 14  BP: (90-132)/(42-68) 126/49 mmHg  FiO2:  [35 %-70 %] 35 %     Intake/Output Summary (Last 24 hours) at 01/17/13 0830  Last data filed at 01/17/13 9811   Gross per 24 hour   Intake   4955 ml   Output    650 ml   Net   4305 ml       Vent Settings:  Ventilator: Drager  MODE: SPN-CPAP/PS  FiO2: 35 %  AutoFlow: On  S RR: 14  Vt: 450 mL  PEEP: 5 cm H20  PS: 5 cm H2O  IT: 0.9 sec  Rise Time/Slope: 0.9  A RR: 16  PIP: 18 cm H2O  MAP: 6 cmH2O  MV: 6.1 l/min  A VT: 436 mL  Vt exp: 440 mL  Vt Spon Insp: 300 mL  A VT: 330 mL    Date Mechanical ventilation commenced:  01/15/13    CVP:  N./a     Last BM:  01/15/13    Current/admission weight:  54.4 Kg / 56.1 Kg    Physical Exam:  Gen: Awake, alert, animated  HEENT: Orally intubated. Large amount of clear oral secretions  Pulm: Lungs clear  Cor: NSR  Abd: soft, hypoactive BS; lap incisions covered with surgical glue and are intact  Ext: mild peripheral with some pitting edema, Left AKA incision healing  Neuro: MAE, strong  Integument: intact    Lab Results:    Recent Labs  Lab 01/17/13  0606 01/16/13  2331 01/16/13  2207 01/15/13  2341   WBC 12.7* 13.2*  --  9.1   Hemoglobin 7.1* 7.4* 8.3* 8.0*   Hematocrit 23* 24*  --  26*   Platelets 199 185  --  194       Recent Labs  Lab 01/17/13  0606 01/16/13  2331 01/15/13  2342  01/15/13  0800  01/14/13  0038   Sodium 144 145 146*  < > 150*  < > 149*   Potassium 4.6 4.7 3.8  < > 3.3  < > 3.5   Chloride 116* 116* 117*  < > 121*  < > 119*   CO2 19* 20 20  < > 21  < > 21   UN 8 9 9   < > 11  < > 14   Creatinine 0.91 0.88 0.83  < > 0.89  < > 0.92   Glucose 103* 131* 84  < > 92  < > 89   Magnesium 1.2*  --  1.9  --  1.4  --  1.9    Phosphorus  --   --  2.0*  --  2.4*  --  2.8   < > = values in this interval not displayed.    Recent Labs  Lab 01/15/13  2328 01/12/13  0921   Protime 11.7 CANCELED     No results found for this basename: TROP,  CKMBS,  in the last 168 hours  No results found for this basename: TP,  ALB,  TB,  DB,  ALK,  AST,  ALT,  in the last 168  hours    Recent Labs  Lab 01/12/13  0957   Lactate VEN,WB 1.0        Micro:  01/06/13 Urine: >100K Yeast   01/06/13 Blood Cx: x 2 negative  01/12/13 Blood Cx: x 2 NGTD    Imaging:     Lines- date/site:  PIVs    Active Hospital Medications:   . magnesium sulfate  2,000 mg Intravenous Once   . pantoprazole  40 mg Intravenous Q24H   . metoprolol  5 mg Intravenous Q6H   . heparin (porcine)  5,000 Units Subcutaneous Q8H   . miconazole   Topical Q12H SCH   . carbidopa-levodopa ER  1 tablet Oral Q12H SCH   . sertraline  50 mg Oral Daily      . sodium chloride        ondansetron, HYDROmorphone PF, HYDROmorphone PF     Patient Active Problem List   Diagnosis Code   . AKI (acute kidney injury) 584.9   . Unspecified cerebral artery occlusion with cerebral infarction 434.91   . Coronary artery disease 414.00   . AICD (automatic cardioverter/defibrillator) present V45.02   . Hypertension 401.9   . CVA (cerebral infarction) 434.91   . ASHD (arteriosclerotic heart disease) 414.00   . MI (mitral incompetence) 424.0   . CHF (congestive heart failure) 428.0   . COPD (chronic obstructive pulmonary disease) 496   . ETOH abuse 305.00   . VT (ventricular tachycardia) 427.1   . Anemia of chronic disease 285.29   . Parkinsonism 332.0   . C. difficile colitis 008.45   . Compression fracture of L1 lumbar vertebra 805.4   . Sepsis 038.9, 995.91   . 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 Peripheral vascular disease 443.9   . SBO (small bowel obstruction) 560.9   . Hypotension 458.9   . Acute kidney injury 584.9   . Dehydration 276.51   . S/P dx  laparoscopy, LOA 01/16/13 V45.89     Assessment:   Michelle [name] is a 67 year old female with a PMH of CHF, HTN, parkinsonian tremor, PVD, s/p hemicolectomy, admitted initially 11 days ago with an ileus, since resolved, but underwent a diagnostic laparoscopy with extensive LOA yesterday for recurrent SBO.  She "failed" her extubation trail post op and was transferred to the ICU on mechanical ventilation.    PLAN:    Pulm:   Hx COPD  Failue to extubate post op probably d/t insufficient time to wake up  -  Resume Duo Neb ATC  -  PS trial  -  Lasix now    CVS:   Hx CHF, VT, ASHD s/p dual chamber ICD, Hx severe PVD s/p amputation with revision  - Amiodarone and coreg are on hold until taking PO  - Continue IV metoprolol 5 mg q 6 Hour until tolerating po  - Amiodarone has a half life of 58 days so she should be OK off it for for several days  - Last LVEF was 25% per echo done 01/06/13.  CXR does show some pulmonary edema.  Will give IV lasix 20 mg once prior to extubation, and stop IVF    Renal/E/F:   She has a Hx of mild CKD  - renal indices are WNL  - UO good  - Magnesium is low and would goal for > 1.9 with her Hx of VT  - Add phosphorous and follow.  Suspect she will be at risk for refeeding  - Follow daily BMP  - Yes, keep foley   Volume goal for next 24hrs:    GI/Nutrition:   Hx left hemicolectomy, admitted with ileus, then had recurrent SBO, S/P diagnostic lap w LOA POD #1  - Care per surgery  - NPO  - On IV PPI (Pepcid home medication)      ID (Include start and stop dates for abx):   No indications for antibiotics  Follow daily CBC    Heme:   Anemia of chronic illness   - On B12 po as maintenance medication, hold while NPO  - Slight drop in HCT overnight probably reflective of fluid shifts and minimal periop blood loss  - She is not hypotensive, but with her Hx of CHF and low LVEF, consider transfusion once diuresed, if Hct continues to drop and/or she fails to extubate   Transfusion? Is Hct <21? no    Endo:   -  Hold Caltrate until taking PO   - Hold D3 until taking PO    Neuro:   Hx depression   - Holding sertraline   Parkinsonian tremor  - Holding Sinemet 25-100 BID until she takes PO    Pain/agitation/delirium:  She has not taken any hydromorphone and appears to have good pain control    Mobility:  OOB once extubated     Integument:   Local wound care      GOALS:  Pulmonary/Ventilator Bundle:  Meet spontaneous breathing trial criteria?  yes  Decrease FiO2? yes  Decrease PEEP? no  HOB at 30 degrees? yes  DVT prophylaxis? yes If no, document contraindication:  PUD prophylaxis? yes  Pain/Sedation holiday indicated today? yes  Did pain/sedation holiday occur yesterday? N/A  Can activity be advanced?  yes  Can central line be d/c'd? N/A  Can other catheters/tubes be d/c'ed? no  Can foley be removed?no If not, document indication:     Medication reconciliation:    Should any home meds be restarted? no  Should any meds be d/c'ed? no    Are daily labs needed? yes  Are restraints required? yes If so, document indication: from pulling on lines ETT with mild confusion    Author: Lezlie Lye, NP  as of: 01/17/2013 at: 8:30 AM

## 2013-01-17 NOTE — Anesthesia Post-procedure Eval (Signed)
Anesthesia Post-op Note    Patient: Michelle [name]    Procedure(s) Performed: exploratory laparoscopy, extensive LOA    Anesthesia type: General    Patient location: ICU    Mental Status: minimal sedation in ICU, resting but arouses easily    Patient able to participate in this evaluation: yes, nods head to answer, intubated  Last Vitals: BP: 122/50 mmHg (01/17/13 1000)  BP MAP : 68 mmHg (01/17/13 1000)  Arterial Line BP 2: 137/51 mmHg (01/08/13 1300)  A- Line 2 MAP: 57 mmHg (01/08/13 1400)  Heart Rate: 67 (01/17/13 1000)  Temp: 37.4 C (99.3 F) (01/17/13 0800)  Resp: 22 (01/17/13 1000)  Height: 160 cm (5\' 3" ) (01/06/13 1405)  Weight: 54.4 kg (119 lb 14.9 oz) (01/16/13 2000)  BMI (Calculated): 21.3 (01/16/13 2000)  BSA (Calculated - sq m): 1.68 sq meters (01/06/13 1405)  SpO2: 100 % (01/17/13 1019)      Post-op vital signs noted above are appropriate for condition  Post-op vitals signs: stable  Respiratory function: ventilator pressure support trial, moving towards extubating    Airway patent: Yes: intubated    Cardiovascular and hydration status stable: Yes    Post-Op pain: Adequate analgesia    Post-Op nausea and vomiting: controlled    Post-Op assessment: no apparent anesthetic complications    Complications: respiratory failure in PACU requiring reintubation.  TV prior to extubation in OR were 400-450, unable to maintain respiratory effort once extubated.  Currently patient doing well on PS, moving towards extubation.    Attending Attestation: All indicated post anesthesia care provided    Author: Janelle Floor, MD  as of: 01/17/2013  at: 11:04 AM

## 2013-01-17 NOTE — Progress Notes (Signed)
Pulmonary/CCM Faculty    Had been in ICU for SBO; on floor for a few days; now back post-op after LOA (no bowel resection);     Today's exam is significant for: strong mechanics on 5/5; wide awake; very much wants to be extubated but was actually extubated last night and reintubated; reg tones; soft belly with wound C/D/I; no edema; skin intact    Highlights from today's plan of care:     1) SBO: maintain NPO; pain control s/p LOA    2) Systolic CHF: resume coreg and ADD ALDACTONE as soon as she is eating; IV metop for now; manage her stingy    3) Ac Resp Failure: attempt extubation but we should t-piece first given systolic CHF and fluid resuc; lasix IV to try to make sure she is crispy.    I rounded with the house staff, Lezlie Lye, and spoke directly with the bedside nurse. I personally examined the patient, reviewed the medication administration list, looked at the vital flow sheets and the labs, and discussed each element of today's plan. The note separately documented by the housestaff reflects my detailed input.  Please see it for the remaining details.     Feel free to text/web page me (preferably, or [phone]) or call my cell [phone] for questions related to the patient's care.    Littie Deeds  For the purpose of insurance:    This patient is critically ill with at least 1 organ system failure associated with a high probability of imminent or life threatening deterioration.  The care I have delivered involved high complexity decision making to assess, manipulate, and support vital system function(s), to treat the vital organ system failure and/or prevent further life threatening deterioration of the patient's condition.  All nursing documentation, laboratory data, test results, and radiographs were reviewed and interpreted by me in light of recent events and the present physical exam.  I have established the management plan for this patient's critical illness and have been immediately available to assist  with patient care.  My documented total critical care time reflects my own patient care time and does not include teaching or procedure time.    Critical care time: I spent 32 minutes personally attending to this patient's critical care needs.  This critical care time is exclusive of any time for separately billable procedures.

## 2013-01-17 NOTE — Progress Notes (Addendum)
General Surgery    Subjective     S/p dx laparoscopy, LOA for SBO yesterday. To ICU post op as she required reintubation due to respiratory failure in the PACU. Overnight, no acute issues. Remains intubated, but awake and offers no complaints this morning.     Objective     Temp (24hrs), Avg:36.4 C (97.6 F), Min:36 C (96.8 F), Max:37.6 C (99.7 F)      I/O last 3 completed shifts:  12/05 0700 - 12/06 0659  In: 4925 (90.5 mL/kg) [P.O.:50; I.V.:3875 (3 mL/kg/hr); Other:1000]  Out: 650 (11.9 mL/kg) [Urine:640 (0.5 mL/kg/hr); Blood:10]  Net: 4275  Weight used: 54.4 kg    Physical Examination:    Blood pressure 126/49, pulse 63, temperature 37.6 C (99.7 F), temperature source Axillary, resp. rate 14, height 1.6 m (5\' 3" ), weight 54.4 kg (119 lb 14.9 oz), SpO2 100.00%.    General: NAD, comfortable. Awake and alert, nods yes/no to questions.   HEENT: NGT to LIWS. Intubated.   Pulm/Lungs:  Clear bilaterally.   CV: RRR  GI/Abdomen: soft, non tender, minimally distended. Incisions are clean, dry and intact.   Extremities: warm, perfused. S/p L AKA.    Labs:    Recent Labs  Lab 01/17/13  0606 01/16/13  2331 01/15/13  2342  01/15/13  0800   Sodium 144 145 146*  < > 150*   Potassium 4.6 4.7 3.8  < > 3.3   Chloride 116* 116* 117*  < > 121*   CO2 19* 20 20  < > 21   UN 8 9 9   < > 11   Creatinine 0.91 0.88 0.83  < > 0.89   Glucose 103* 131* 84  < > 92   Calcium 8.7 9.0 8.1*  < > 7.8*   Magnesium 1.2*  --  1.9  --  1.4   Phosphorus  --   --  2.0*  --  2.4*   < > = values in this interval not displayed.   Recent Labs  Lab 01/17/13  0606 01/16/13  2331  01/15/13  2328  01/12/13  0921   WBC 12.7* 13.2*  < >  --   < >  --    Hemoglobin 7.1* 7.4*  < >  --   < >  --    Hematocrit 23* 24*  < >  --   < >  --    Platelets 199 185  < >  --   < >  --    Protime  --   --   --  11.7  --  CANCELED   INR  --   --   --  1.1  --  CANCELED   < > = values in this interval not displayed.   Recent Labs  Lab 01/15/13  2342 01/15/13  0800  01/14/13  0038 01/13/13  0203 01/12/13  0058   AST  --   --   --   --  18   ALT  --   --   --   --  5   Albumin 2.5* 2.4* 2.5* 2.7* 3.0*   Prealbumin  --   --  12* 12*  --         Assessment and Plan     67 y.o. year old female with PMH significant for CAD, CHF, COPD, CVA, VT s/p AICD, anemia, PVD and partial colectomy admitted with SBO, now s/p dx laparoscopy, LOA on 01/16/13.  CV: no acute issues. Monitor HR, rhythm. On coreg and amiodarone at home. Currently on IV metoprolol due to post op status. Will resume amiodarone when po is started.   Pulmonary: PST, extubate per ICU. Anticipate extubation this am.   GI/Hepatic: NPO. NGT to LIWS. Will need to discuss TPN if she has a prolonged ileus post op. For now, continue to monitor.   Renal: follow UOP. Keep foley please.   FEN: replete electrolytes prn.   ID: no antibiotics indicated. Follow WBC, slightly increased today as expected in post op patient.   Heme: HCT 23, down from 26 (likely dilutional) will monitor.   Endo: no acute issues. Monitor BGs.   Neuro/Sedation/Pain: prn dilaudid for pain control. Will start po meds once tolerating po. Will start home zoloft and sinemet once tolerating po.   Prophylaxis: heparin, protonix.     Everlene Other, MD  01/17/2013 8:01 AM    I saw and evaluated the patient. I agree with the resident's/fellow's findings and plan of care as documented above. Details of my evaluation are as follows: looks good.  Awake.  Shakes head no to abdominal pain.  abd is soft and nontender.      As above.  Hope to extubate soon.  Have not been able to contact family.  Per nurse no calls.  PICC line probably.      Roe Rutherford, MD

## 2013-01-17 NOTE — Progress Notes (Signed)
Patient place on t-piece, duoneb given

## 2013-01-17 NOTE — Progress Notes (Signed)
Extubated PT and placed on 3LNC. NARD.   HR 69 RR17 SaO2 100%

## 2013-01-18 LAB — CBC AND DIFFERENTIAL
Baso # K/uL: 0 10*3/uL (ref 0.0–0.1)
Basophil %: 0 % (ref 0.1–1.2)
Eos # K/uL: 0.2 10*3/uL (ref 0.0–0.4)
Eosinophil %: 2 % (ref 0.7–5.8)
Hematocrit: 24 % — ABNORMAL LOW (ref 34–45)
Hemoglobin: 7.4 g/dL — ABNORMAL LOW (ref 11.2–15.7)
Lymph # K/uL: 2.7 10*3/uL (ref 1.2–3.7)
Lymphocyte %: 23 % (ref 19.3–51.7)
MCH: 30 pg (ref 26–32)
MCHC: 31 g/dL — ABNORMAL LOW (ref 32–36)
MCV: 97 fL — ABNORMAL HIGH (ref 79–95)
Mono # K/uL: 0.6 10*3/uL (ref 0.2–0.9)
Monocyte %: 5 % (ref 4.7–12.5)
Neut # K/uL: 7.2 10*3/uL — ABNORMAL HIGH (ref 1.6–6.1)
Platelets: 177 10*3/uL (ref 160–370)
RBC: 2.5 MIL/uL — ABNORMAL LOW (ref 3.9–5.2)
RDW: 17.1 % — ABNORMAL HIGH (ref 11.7–14.4)
Seg Neut %: 66 % (ref 34.0–71.1)
WBC: 10.9 10*3/uL — ABNORMAL HIGH (ref 4.0–10.0)

## 2013-01-18 LAB — DIFF BASED ON: Diff Based On: 100 CELLS

## 2013-01-18 LAB — REACTIVE LYMPHS: React Lymph %: 2 % (ref 0.0–6.0)

## 2013-01-18 LAB — MAGNESIUM: Magnesium: 1.4 mEq/L (ref 1.3–2.1)

## 2013-01-18 LAB — METAMYELOCYTE: Metamyelocyte %: 1 % (ref 0–1)

## 2013-01-18 LAB — SLIDE NUMBER: Slide # (Heme): 2333

## 2013-01-18 LAB — BASIC METABOLIC PANEL
Anion Gap: 10 (ref 7–16)
CO2: 22 mmol/L (ref 20–28)
Calcium: 8.5 mg/dL — ABNORMAL LOW (ref 8.6–10.2)
Chloride: 113 mmol/L — ABNORMAL HIGH (ref 96–108)
Creatinine: 0.92 mg/dL (ref 0.51–0.95)
GFR,Black: 74 *
GFR,Caucasian: 64 *
Glucose: 73 mg/dL (ref 60–99)
Lab: 9 mg/dL (ref 6–20)
Potassium: 4 mmol/L (ref 3.3–5.1)
Sodium: 145 mmol/L (ref 133–145)

## 2013-01-18 LAB — MYELOCYTE: Myelocyte %: 1 % — ABNORMAL HIGH (ref 0–0)

## 2013-01-18 LAB — MANUAL DIFFERENTIAL

## 2013-01-18 LAB — POCT GLUCOSE: Glucose POCT: 73 mg/dL (ref 60–99)

## 2013-01-18 LAB — ANISOCYTOSIS

## 2013-01-18 LAB — PHOSPHORUS: Phosphorus: 3 mg/dL (ref 2.7–4.5)

## 2013-01-18 MED ORDER — MAGNESIUM SULFATE 2GM IN 50ML STERILE WATER *A*
2000.0000 mg | Freq: Once | INTRAMUSCULAR | Status: AC
Start: 2013-01-18 — End: 2013-01-18
  Administered 2013-01-18: 2000 mg via INTRAVENOUS
  Filled 2013-01-18: qty 50

## 2013-01-18 MED ORDER — CARBIDOPA-LEVODOPA 25-100 MG PO TABS *I*
1.0000 | ORAL_TABLET | Freq: Two times a day (BID) | ORAL | Status: DC
Start: 2013-01-18 — End: 2013-01-20
  Administered 2013-01-18 – 2013-01-19 (×4): 1 via NASOGASTRIC
  Filled 2013-01-18 (×4): qty 1

## 2013-01-18 MED ORDER — AMIODARONE HCL 200 MG PO TABS *I*
400.0000 mg | ORAL_TABLET | Freq: Every day | ORAL | Status: DC
Start: 2013-01-18 — End: 2013-01-20
  Administered 2013-01-18 – 2013-01-19 (×2): 400 mg via NASOGASTRIC
  Filled 2013-01-18 (×2): qty 2

## 2013-01-18 MED ORDER — CARBIDOPA-LEVODOPA CR 25-100 MG PO TBCR *I*
1.0000 | ORAL_TABLET | Freq: Two times a day (BID) | ORAL | Status: DC
Start: 2013-01-18 — End: 2013-01-18

## 2013-01-18 MED ORDER — SERTRALINE HCL 50 MG PO TABS *I*
50.0000 mg | ORAL_TABLET | Freq: Every day | ORAL | Status: DC
Start: 2013-01-19 — End: 2013-01-23
  Administered 2013-01-19 – 2013-01-23 (×5): 50 mg via NASOGASTRIC
  Filled 2013-01-18 (×5): qty 1

## 2013-01-18 MED ORDER — SODIUM CHLORIDE 0.9 % IV SOLN WRAPPED *I*
100.0000 mL/h | Status: DC
Start: 2013-01-18 — End: 2013-01-19
  Administered 2013-01-18 – 2013-01-19 (×3): 100 mL/h via INTRAVENOUS

## 2013-01-18 NOTE — Progress Notes (Addendum)
General Surgery    Subjective     Extubated yesterday. No acute events.     Objective     Temp (24hrs), Avg:36.7 C (98 F), Min:36.2 C (97.2 F), Max:37.4 C (99.3 F)      I/O last 3 completed shifts:  12/06 0700 - 12/07 0659  In: 392.5 (7.3 mL/kg) [I.V.:342.5 (0.3 mL/kg/hr); IV Piggyback:50]  Out: 2070 (38.3 mL/kg) [Urine:2050 (1.6 mL/kg/hr); Emesis/NG output:20]  Net: -1677.5  Weight used: 54 kg    Physical Examination:    Blood pressure 135/61, pulse 63, temperature 36.2 C (97.2 F), temperature source Temporal, resp. rate 16, height 1.6 m (5\' 3" ), weight 54 kg (119 lb 0.8 oz), SpO2 100.00%.    General: NAD, comfortable. Awake and alert.   HEENT: NGT to LIWS with no output in canister.   Pulm/Lungs:  Clear bilaterally.   CV: RRR  GI/Abdomen: soft, non tender, non distended. Incisions are clean, dry and intact.   Extremities: warm, perfused. S/p L AKA.    Labs:    Recent Labs  Lab 01/18/13  0458 01/17/13  0606   Sodium 145 144   Potassium 4.0 4.6   Chloride 113* 116*   CO2 22 19*   UN 9 8   Creatinine 0.92 0.91   Glucose 73 103*   Calcium 8.5* 8.7   Magnesium 1.4 1.2*   Phosphorus 3.0 3.0      Recent Labs  Lab 01/18/13  0458 01/17/13  0606  01/15/13  2328  01/12/13  0921   WBC 10.9* 12.7*  < >  --   < >  --    Hemoglobin 7.4* 7.1*  < >  --   < >  --    Hematocrit 24* 23*  < >  --   < >  --    Platelets 177 199  < >  --   < >  --    Protime  --   --   --  11.7  --  CANCELED   INR  --   --   --  1.1  --  CANCELED   < > = values in this interval not displayed.   Recent Labs  Lab 01/15/13  2342 01/15/13  0800 01/14/13  0038 01/13/13  0203 01/12/13  0058   AST  --   --   --   --  18   ALT  --   --   --   --  5   Albumin 2.5* 2.4* 2.5* 2.7* 3.0*   Prealbumin  --   --  12* 12*  --         Assessment and Plan     67 y.o. year old female with PMH significant for CAD, CHF, COPD, CVA, VT s/p AICD, anemia, PVD and partial colectomy admitted with SBO, now s/p dx laparoscopy, LOA on 01/16/13.     CV: continue IV metoprolol.  Will resume home regimen (coreg, amiodarone) once diet is started.  Pulmonary: wean NC as tolerated. Aggressive pulmonary toilet.   GI/Hepatic: NPO. NGT to gravity. Await return of bowel function. ?TPN tomorrow if not able to start diet tomorrow. If minimal NG output, will likely trial clears in AM.   Renal: continue foley x 1 more day for strict I/Os while NPO.   FEN: replete electrolytes prn.   ID: WBC decreasing. Not on abx.   Heme: HCT stable.   Endo: no acute issues. Monitor BGs.   Neuro/Sedation/Pain: prn dilaudid for  pain control. Holding home zoloft and sinemet until tolerating po.   Prophylaxis: heparin, protonix.     Okay for transfer to floor under Dr. Henriette Combs service.     Everlene Other, MD  01/18/2013 7:35 AM      I saw and evaluated the patient. I agree with the resident's/fellow's findings and plan of care as documented above.    Darrell Jewel, MD

## 2013-01-18 NOTE — Progress Notes (Signed)
Pt pulled out her NGT at 1800. She said she does not need it. Notified provider and awaiting for response. Livia Snellen, RN

## 2013-01-18 NOTE — Progress Notes (Addendum)
ICU Daily Progress Note for Inpatients   LOS: 12 days       Interval History:   S/P Diagnostic laparoscopy with extensive LOA POD #2  Extubated successfully yesterday    Subjective:  Awake and alert  Pain controlled    Objective Section:  Temp:  [36.2 C (97.2 F)-37.2 C (98.9 F)] 36.2 C (97.2 F)  Heart Rate:  [61-69] 63  Resp:  [15-22] 16  BP: (110-139)/(41-94) 139/52 mmHg  FiO2:  [30 %-35 %] 35 %     Intake/Output Summary (Last 24 hours) at 01/18/13 0842  Last data filed at 01/18/13 0759   Gross per 24 hour   Intake 240.52 ml   Output   2215 ml   Net -1974.48 ml       Vent Settings:  Ventilator: Drager  MODE: SPN-CPAP/PS  FiO2: 35 %  PEEP: 5 cm H20  PS: 5 cm H2O  A RR: 22  MAP: 7 cmH2O  MV: 7.6 l/min  Vt Spon Insp: 326 mL  A VT: 355 mL    Date Mechanical ventilation commenced:  01/15/13    CVP:  N/a     Last BM:  01/15/13    Current/admission weight:  54 Kg / 56.1 Kg    Physical Exam:  Gen: Awake, alert, animated  HEENT: Neck supple, fewer oral secretions.   Pulm: Lungs clear, decreased at bases  Cor: NSR  Abd: soft, hypoactive BS; lap incisions covered with surgical glue and are intact  Ext: mild peripheral with some pitting edema, Left AKA incision healing  Neuro: MAE, strong  Integument: intact    Lab Results:    Recent Labs  Lab 01/18/13  0458 01/17/13  0606 01/16/13  2331   WBC 10.9* 12.7* 13.2*   Hemoglobin 7.4* 7.1* 7.4*   Hematocrit 24* 23* 24*   Platelets 177 199 185       Recent Labs  Lab 01/18/13  0458 01/17/13  0606 01/16/13  2331 01/15/13  2342   Sodium 145 144 145 146*   Potassium 4.0 4.6 4.7 3.8   Chloride 113* 116* 116* 117*   CO2 22 19* 20 20   UN 9 8 9 9    Creatinine 0.92 0.91 0.88 0.83   Glucose 73 103* 131* 84   Magnesium 1.4 1.2*  --  1.9   Phosphorus 3.0 3.0  --  2.0*       Recent Labs  Lab 01/15/13  2328 01/12/13  0921   Protime 11.7 CANCELED     No results found for this basename: TROP,  CKMBS,  in the last 168 hours  No results found for this basename: TP,  ALB,  TB,  DB,  ALK,  AST,   ALT,  in the last 168 hours    Recent Labs  Lab 01/12/13  0957   Lactate VEN,WB 1.0        Micro:  01/06/13 Urine: >100K Yeast   01/06/13 Blood Cx: x 2 negative  01/12/13 Blood Cx: x 2 NGTD    Imaging:     Lines- date/site:  PIVs    Active Hospital Medications:   . ipratropium-albuterol  3 mL Nebulization 4x Daily   . pantoprazole  40 mg Intravenous Q24H   . metoprolol  5 mg Intravenous Q6H   . heparin (porcine)  5,000 Units Subcutaneous Q8H   . miconazole   Topical Q12H SCH   . carbidopa-levodopa ER  1 tablet Oral Q12H SCH   . sertraline  50 mg Oral Daily      . sodium chloride 3 mL/hr (01/18/13 0759)      ondansetron, HYDROmorphone PF     Patient Active Problem List   Diagnosis Code   . AKI (acute kidney injury) 584.9   . Unspecified cerebral artery occlusion with cerebral infarction 434.91   . Coronary artery disease 414.00   . AICD (automatic cardioverter/defibrillator) present V45.02   . Hypertension 401.9   . CVA (cerebral infarction) 434.91   . ASHD (arteriosclerotic heart disease) 414.00   . MI (mitral incompetence) 424.0   . CHF (congestive heart failure) 428.0   . COPD (chronic obstructive pulmonary disease) 496   . ETOH abuse 305.00   . VT (ventricular tachycardia) 427.1   . Anemia of chronic disease 285.29   . Parkinsonism 332.0   . C. difficile colitis 008.45   . Compression fracture of L1 lumbar vertebra 805.4   . Sepsis 038.9, 995.91   . 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 Peripheral vascular disease 443.9   . SBO (small bowel obstruction) 560.9   . Hypotension 458.9   . Acute kidney injury 584.9   . Dehydration 276.51   . S/P dx laparoscopy, LOA 01/16/13 V45.89     Assessment:   Michelle [name] is a 67 year old female with a PMH of CHF, HTN, parkinsonian tremor, PVD, s/p hemicolectomy, admitted initially 11 days ago with an ileus, since resolved, but underwent a diagnostic laparoscopy with extensive LOA 01/16/13 for recurrent SBO.  She  "failed" her extubation trail post op and was transferred to the ICU on mechanical ventilation.    PLAN:    Pulm:   Hx COPD  Failue to extubate post op probably d/t insufficient time to wake up, now successfully extubated and doing well  -  Continue Duo Neb ATC  -  O2 2 liters by n/c  -  Hold diuresis in the setting of strong UO at present    CVS:   Hx CHF, VT, ASHD s/p dual chamber ICD, Hx severe PVD s/p amputation with revision  - Amiodarone can be resumed per surgery.   Will continue to hold Coreg and substitute IV metoprolol 5 mg q 6 Hour until gut absorption is assured  - Last LVEF was 25% per echo done 01/06/13.  CXR does show some pulmonary edema.  She diuresed >1.6 L yesterday and UO still strong.  If UO does drop off, would not hesitate to give her additional furosemide.    Renal/E/F:   She has a Hx of mild CKD  - renal indices are WNL  - UO good  - Magnesium is low and would goal for > 1.9 with her Hx of VT  - Follow phosphorous.  Suspect she will be at risk for refeeding  - Follow daily BMP  - Yes, keep foley for today at least   Volume goal for next 24hrs:    GI/Nutrition:   Hx left hemicolectomy, admitted with ileus, then had recurrent SBO, S/P diagnostic lap w LOA POD #1  - Care per surgery  - NPO x essential meds (amiodarone, sinemet, sertraline)  - On IV PPI (Pepcid home medication)      ID (Include start and stop dates for abx):   No indications for antibiotics  Follow daily CBC    Heme:   Anemia of chronic illness   - On B12 po as maintenance  medication, hold while NPO  - Slight drop in HCT overnight probably reflective of fluid shifts and minimal periop blood loss, stable     Transfusion? Is Hct <21? no    Endo:   - Hold Caltrate until taking PO   - Hold D3 until taking PO    Neuro:   Hx depression   - Resume sertraline. Her affect is more depressed than usual   Parkinsonian tremor  - Resume Sinemet 25-100 BID down NG.     Pain/agitation/delirium:  She has not taken any hydromorphone and appears  to have good pain control    Mobility:  OOB to chair  - PT consult for restorative and placement     Integument:   Local wound care      GOALS:  Pulmonary/Ventilator Bundle:  Meet spontaneous breathing trial criteria?  yes  Decrease FiO2? yes  Decrease PEEP? no  HOB at 30 degrees? yes  DVT prophylaxis? yes If no, document contraindication:  PUD prophylaxis? yes  Pain/Sedation holiday indicated today? yes  Did pain/sedation holiday occur yesterday? N/A  Can activity be advanced?  yes  Can central line be d/c'd? N/A  Can other catheters/tubes be d/c'ed? no  Can foley be removed?no If not, document indication:     Medication reconciliation:    Should any home meds be restarted? no  Should any meds be d/c'ed? no    Are daily labs needed? yes  Are restraints required? yes If so, document indication: from pulling on lines ETT with mild confusion    Author: Lezlie Lye, NP  as of: 01/18/2013 at: 8:42 AM

## 2013-01-18 NOTE — Op Note (Signed)
Michelle [name], Michelle [name]  MR #:  [id number]   ACCOUNT #:  [id number] DOB:  [birth year]    AGE:  67     SURGEON:  Roe Rutherford, MD  CO-SURGEON:    ASSISTANT:    SURGERY DATE:  01/16/2013    PREOPERATIVE DIAGNOSIS:  Small bowel obstruction secondary to adhesions.    POSTOPERATIVE DIAGNOSIS:  Small bowel obstruction secondary to adhesions.    OPERATIVE PROCEDURE:  Laparoscopic lysis of extensive adhesions.    ANESTHESIA:  General endotracheal.    INDICATIONS:  The patient is a 67 year old woman with multiple medical issues.  She is status post amputation of her lower extremity within the last couple of months.  She presented back to the hospital, and was ruled out for a myocardial infarction as well as some subcutaneous air that was a result of clysis at her nursing home.  She had nausea, vomiting, and a CAT scan and free-air series revealed a small bowel obstruction.  We were consulted.  She got over the obstruction, but then this recurred a few days later.  She continues to have NG output, although she just began to move her bowels.  She continues to be distympanitic, and she is brought to the operating room for laparoscopy after much discussion.  Her family was spoken to on one occasion, but otherwise has been unable to be contacted despite multiple calls and messages over the last couple of days.    FINDINGS:  Chronic adhesions from her previous colon surgery and laparotomy in the past.  She had terminal ileum stuck in the right lower quadrant and pelvis that revealed chronic obstructive symptoms.  There was dilated proximal bowel that tapered to nondilated terminal ileum.  Appendix was normal.  The cecum was normal.    DESCRIPTION OF PROCEDURE:  After satisfactory induction of general endotracheal anesthesia, the patient was prepped and draped in the usual fashion with ChloraPrep.  A Foley catheter had been placed.  A 5 mm left upper quadrant incision was made and a bladeless trocar inserted into the abdomen  under direct vision, insufflated to 15 mmHg pressure.  Three additional 5 mm trocars were inserted.  Extensive lysis of adhesions was performed until the small bowel could be run from the terminal ileum to the ligament of Treitz.  In the proximal jejunum, there was a collection of interloop adhesions that was not taken down.  The bowel entering and coming out of this gut ball was both dilated.  The adhesive apart that was causing the obstruction was in the pelvis and I think was twisting around some adhesions to the anterior abdominal wall, that also were taken down.  There was no bleeding noted.  The abdomen was desufflated, and all ports were removed under direct vision.  Marcaine 0.25% with epinephrine was instilled.  A 4-0 Monocryl was used to close the skin in subcuticular fashion.  The patient was extubated, then had to be reintubated, and will be brought to the intensive care unit for monitoring.             ______________________________  Roe Rutherford, MD    JAJ/MODL  DD:  01/17/2013 12:51:05  DT:  01/17/2013 14:42:07  Job #:  [id number]    cc:

## 2013-01-18 NOTE — Progress Notes (Signed)
Paged on call surgical moonlighter regarding pt's NG tube. Awaiting response. wil continye to monitor. Selmer Dominion, RN

## 2013-01-19 LAB — BASIC METABOLIC PANEL
Anion Gap: 14 (ref 7–16)
CO2: 22 mmol/L (ref 20–28)
Calcium: 8.4 mg/dL — ABNORMAL LOW (ref 8.6–10.2)
Chloride: 109 mmol/L — ABNORMAL HIGH (ref 96–108)
Creatinine: 0.86 mg/dL (ref 0.51–0.95)
GFR,Black: 80 *
GFR,Caucasian: 70 *
Glucose: 68 mg/dL (ref 60–99)
Lab: 8 mg/dL (ref 6–20)
Potassium: 4.1 mmol/L (ref 3.3–5.1)
Sodium: 145 mmol/L (ref 133–145)

## 2013-01-19 LAB — CBC AND DIFFERENTIAL
Baso # K/uL: 0 10*3/uL (ref 0.0–0.1)
Basophil %: 0.1 % (ref 0.1–1.2)
Eos # K/uL: 0.2 10*3/uL (ref 0.0–0.4)
Eosinophil %: 1.8 % (ref 0.7–5.8)
Hematocrit: 26 % — ABNORMAL LOW (ref 34–45)
Hemoglobin: 7.9 g/dL — ABNORMAL LOW (ref 11.2–15.7)
Lymph # K/uL: 1.8 10*3/uL (ref 1.2–3.7)
Lymphocyte %: 18.8 % — ABNORMAL LOW (ref 19.3–51.7)
MCH: 29 pg (ref 26–32)
MCHC: 31 g/dL — ABNORMAL LOW (ref 32–36)
MCV: 96 fL — ABNORMAL HIGH (ref 79–95)
Mono # K/uL: 0.8 10*3/uL (ref 0.2–0.9)
Monocyte %: 8.2 % (ref 4.7–12.5)
Neut # K/uL: 6.8 10*3/uL — ABNORMAL HIGH (ref 1.6–6.1)
Platelets: 198 10*3/uL (ref 160–370)
RBC: 2.7 MIL/uL — ABNORMAL LOW (ref 3.9–5.2)
RDW: 16.8 % — ABNORMAL HIGH (ref 11.7–14.4)
Seg Neut %: 70.2 % (ref 34.0–71.1)
WBC: 9.7 10*3/uL (ref 4.0–10.0)

## 2013-01-19 LAB — POCT GLUCOSE
Glucose POCT: 65 mg/dL (ref 60–99)
Glucose POCT: 93 mg/dL (ref 60–99)

## 2013-01-19 LAB — PHOSPHORUS: Phosphorus: 2.8 mg/dL (ref 2.7–4.5)

## 2013-01-19 LAB — MAGNESIUM: Magnesium: 1.4 mEq/L (ref 1.3–2.1)

## 2013-01-19 MED ORDER — MAGNESIUM SULFATE 2GM IN 50ML STERILE WATER *A*
2000.0000 mg | Freq: Once | INTRAMUSCULAR | Status: AC
Start: 2013-01-19 — End: 2013-01-19
  Administered 2013-01-19: 2000 mg via INTRAVENOUS
  Filled 2013-01-19: qty 50

## 2013-01-19 NOTE — Progress Notes (Signed)
BG 65, spoke with team II provider, one cup of Apple juice given. Will continue to monitor.   Donnella Sham, RN

## 2013-01-19 NOTE — Progress Notes (Addendum)
General Surgery    Subjective   NG pulled by patient. Only 50 cc of output for that day however. Patient is passing gas, but no BM, denies abdominal pain. Denies N/V. UOP 1315. WBC down to 9.7. Patient afebrile with VSS. saturating at 95% this morning on RA with no desaturations over night.    Objective     Temp (24hrs), Avg:36.4 C (97.6 F), Min:36.1 C (97 F), Max:36.8 C (98.2 F)      I/O last 3 completed shifts:  12/06 2300 - 12/07 2259  In: 1342 (24.3 mL/kg) [I.V.:1057 (0.8 mL/kg/hr); NG/GT:235; IV Piggyback:50]  Out: 1465 (26.5 mL/kg) [Urine:1345 (1 mL/kg/hr); Emesis/NG output:120]  Net: -123  Weight used: 55.3 kg    Physical Examination:    Blood pressure 130/70, pulse 70, temperature 36.1 C (97 F), temperature source Temporal, resp. rate 18, height 1.6 m (5\' 3" ), weight 55.339 kg (122 lb), SpO2 95.00%.    General: NAD, comfortable. Awake and alert.   HEENT: NGT out.   Pulm/Lungs:  Clear bilaterally.   CV: RRR  GI/Abdomen: soft, non tender, non distended. Incisions are clean, dry and intact.   Extremities: warm, perfused. S/p L AKA.    Labs:    Recent Labs  Lab 01/19/13  0346 01/18/13  0458   Sodium 145 145   Potassium 4.1 4.0   Chloride 109* 113*   CO2 22 22   UN 8 9   Creatinine 0.86 0.92   Glucose 68 73   Calcium 8.4* 8.5*   Magnesium 1.4 1.4   Phosphorus 2.8 3.0      Recent Labs  Lab 01/19/13  0346 01/18/13  0458  01/15/13  2328  01/12/13  0921   WBC 9.7 10.9*  < >  --   < >  --    Hemoglobin 7.9* 7.4*  < >  --   < >  --    Hematocrit 26* 24*  < >  --   < >  --    Platelets 198 177  < >  --   < >  --    Protime  --   --   --  11.7  --  CANCELED   INR  --   --   --  1.1  --  CANCELED   < > = values in this interval not displayed.   Recent Labs  Lab 01/15/13  2342 01/15/13  0800 01/14/13  0038 01/13/13  0203   Albumin 2.5* 2.4* 2.5* 2.7*   Prealbumin  --   --  12* 12*        Assessment and Plan     67 y.o. year old female with PMH significant for CAD, CHF, COPD, CVA, VT s/p AICD, anemia, PVD and  partial colectomy admitted with SBO, now s/p dx laparoscopy, LOA on 01/16/13.     CV: Amiodarone. IV metoprolol. Will resume home coreg.  Pulmonary: Aggressive pulmonary toilet.   GI/Hepatic: . NGT pulled by patient but Only 50 CC for the past 24 hours. Patient claims to be passing gas but has not had BM. Will start clears today.   Renal: D/C foley today  FEN: replete electrolytes prn.   ID: WBC decreasing. Not on abx.   Heme: HCT stable.   Endo: no acute issues. Monitor BGs.   Neuro/Sedation/Pain: prn dilaudid for pain control. Home sinemet and zoloft  Prophylaxis: heparin, protonix.       Anson Fret, MD  01/19/2013 6:01 AM  I saw and evaluated the patient. I agree with the resident's/fellow's findings and plan of care as documented above. Details of my evaluation are as follows: looks great.  In bed.  Having stool and gas.  Smiling and wants TV channel changed.  abd is soft, distended, and some tympany present.  No tenderness.  Leg with no tenderness or significant edema.     Advance to aspiration diet.  Would not keep on clears.  Discharge planning with social work.      Roe Rutherford, MD

## 2013-01-19 NOTE — Progress Notes (Signed)
Physical Therapy    Patient currently refusing participation in PT session, despite encouragement.  Educated patient on importance of mobility and therapeutic exercise--"I'll try tomorrow."  PT will continue to follow and re-attempt on 01/20/13.

## 2013-01-19 NOTE — Progress Notes (Signed)
O2 sat in the 80's on room air, 1L O2 placed via NC, sat increased to mid 90's, spoke with team II provider, will continue to monitor.   Donnella Sham, RN

## 2013-01-19 NOTE — Plan of Care (Signed)
Pain/Comfort    . Patient's pain or discomfort is manageable Adequate for discharge          Cognitive function    . Cognitive function will be maintained or return to baseline Completed or Resolved          Psychosocial    . Demonstrates ability to cope with illness Completed or Resolved          Bowel Elimination    . Elimination patterns are normal or improving Progressing towards goal          Mobility    . Functional status is maintained or improved - Geriatric Progressing towards goal          Nutrition    . Nutritional status is maintained or improved - Geriatric Progressing towards goal          Safety    . Patient will remain free of falls Progressing towards goal

## 2013-01-20 LAB — CBC AND DIFFERENTIAL
Baso # K/uL: 0 10*3/uL (ref 0.0–0.1)
Basophil %: 0.2 % (ref 0.1–1.2)
Eos # K/uL: 0.1 10*3/uL (ref 0.0–0.4)
Eosinophil %: 1.2 % (ref 0.7–5.8)
Hematocrit: 26 % — ABNORMAL LOW (ref 34–45)
Hemoglobin: 8.2 g/dL — ABNORMAL LOW (ref 11.2–15.7)
Lymph # K/uL: 1.8 10*3/uL (ref 1.2–3.7)
Lymphocyte %: 15.8 % — ABNORMAL LOW (ref 19.3–51.7)
MCH: 30 pg (ref 26–32)
MCHC: 31 g/dL — ABNORMAL LOW (ref 32–36)
MCV: 95 fL (ref 79–95)
Mono # K/uL: 1.5 10*3/uL — ABNORMAL HIGH (ref 0.2–0.9)
Monocyte %: 13.5 % — ABNORMAL HIGH (ref 4.7–12.5)
Neut # K/uL: 7.7 10*3/uL — ABNORMAL HIGH (ref 1.6–6.1)
Platelets: 164 10*3/uL (ref 160–370)
RBC: 2.8 MIL/uL — ABNORMAL LOW (ref 3.9–5.2)
RDW: 16.3 % — ABNORMAL HIGH (ref 11.7–14.4)
Seg Neut %: 68.4 % (ref 34.0–71.1)
WBC: 11.3 10*3/uL — ABNORMAL HIGH (ref 4.0–10.0)

## 2013-01-20 LAB — BASIC METABOLIC PANEL
Anion Gap: 11 (ref 7–16)
CO2: 25 mmol/L (ref 20–28)
Calcium: 8.3 mg/dL — ABNORMAL LOW (ref 8.6–10.2)
Chloride: 108 mmol/L (ref 96–108)
Creatinine: 0.46 mg/dL — ABNORMAL LOW (ref 0.51–0.95)
GFR,Black: 118 *
GFR,Caucasian: 103 *
Glucose: 96 mg/dL (ref 60–99)
Lab: 7 mg/dL (ref 6–20)
Potassium: 3.6 mmol/L (ref 3.3–5.1)
Sodium: 144 mmol/L (ref 133–145)

## 2013-01-20 LAB — PHOSPHORUS: Phosphorus: 2.6 mg/dL — ABNORMAL LOW (ref 2.7–4.5)

## 2013-01-20 LAB — MAGNESIUM: Magnesium: 1.5 mEq/L (ref 1.3–2.1)

## 2013-01-20 MED ORDER — CARBIDOPA-LEVODOPA 25-100 MG PO TABS *I*
1.0000 | ORAL_TABLET | Freq: Two times a day (BID) | ORAL | Status: DC
Start: 2013-01-20 — End: 2013-01-23
  Administered 2013-01-20 – 2013-01-23 (×6): 1 via ORAL
  Filled 2013-01-20 (×6): qty 1

## 2013-01-20 MED ORDER — CARBIDOPA-LEVODOPA 25-100 MG PO TABS *I*
1.0000 | ORAL_TABLET | Freq: Two times a day (BID) | ORAL | Status: DC
Start: 2013-01-20 — End: 2013-01-20

## 2013-01-20 MED ORDER — MAGNESIUM SULFATE 2GM IN 50ML STERILE WATER *A*
2000.0000 mg | Freq: Once | INTRAMUSCULAR | Status: AC
Start: 2013-01-20 — End: 2013-01-20
  Administered 2013-01-20: 2000 mg via INTRAVENOUS
  Filled 2013-01-20: qty 50

## 2013-01-20 MED ORDER — CEFTRIAXONE SODIUM 1GM IN D5W DUPLEX IV *I*
1.0000 g | INTRAVENOUS | Status: DC
Start: 2013-01-20 — End: 2013-01-20
  Filled 2013-01-20: qty 50

## 2013-01-20 MED ORDER — CARVEDILOL 3.125 MG PO TABS *I*
3.1250 mg | ORAL_TABLET | Freq: Every day | ORAL | Status: DC
Start: 2013-01-20 — End: 2013-01-23
  Administered 2013-01-20 – 2013-01-23 (×4): 3.125 mg via ORAL
  Filled 2013-01-20 (×4): qty 1

## 2013-01-20 MED ORDER — SODIUM CHLORIDE 0.9 % IV SOLN WRAPPED *I*
15.0000 mmol | Freq: Once | INTRAVENOUS | Status: AC
Start: 2013-01-20 — End: 2013-01-21
  Administered 2013-01-20: 15 mmol via INTRAVENOUS
  Filled 2013-01-20 (×2): qty 5

## 2013-01-20 MED ORDER — AMIODARONE HCL 200 MG PO TABS *I*
400.0000 mg | ORAL_TABLET | Freq: Every day | ORAL | Status: DC
Start: 2013-01-20 — End: 2013-01-20

## 2013-01-20 MED ORDER — PIPERACILLIN-TAZOBACTAM IN D5W 4.5 GM/100ML IV SOLN *I*
4.5000 g | Freq: Four times a day (QID) | INTRAVENOUS | Status: DC
Start: 2013-01-20 — End: 2013-01-21
  Administered 2013-01-20 – 2013-01-21 (×4): 4.5 g via INTRAVENOUS
  Filled 2013-01-20 (×9): qty 100

## 2013-01-20 MED ORDER — AMIODARONE HCL 200 MG PO TABS *I*
400.0000 mg | ORAL_TABLET | Freq: Every day | ORAL | Status: DC
Start: 2013-01-20 — End: 2013-01-23
  Administered 2013-01-20 – 2013-01-23 (×4): 400 mg via ORAL
  Filled 2013-01-20 (×4): qty 2

## 2013-01-20 MED ORDER — AMIODARONE HCL 200 MG PO TABS *I*
400.0000 mg | ORAL_TABLET | Freq: Every day | ORAL | Status: DC
Start: 2013-01-21 — End: 2013-01-20

## 2013-01-20 MED ORDER — PANTOPRAZOLE SODIUM 40 MG PO TBEC *I*
40.0000 mg | DELAYED_RELEASE_TABLET | Freq: Every morning | ORAL | Status: DC
Start: 2013-01-20 — End: 2013-01-23
  Administered 2013-01-20 – 2013-01-23 (×4): 40 mg via ORAL
  Filled 2013-01-20 (×4): qty 1

## 2013-01-20 NOTE — Plan of Care (Signed)
Problem: Inadequate oral intake  Goal: Nutritional status maintained or improved  Intervention: Camera operator (oral supplement)  suggest ensure 1.5 BID- pt is agreeable   Intervention: Vitamin/mineral supplement  Suggest daily MVI as pt has been with inadequate oral intake since admission and with slowly improving appetite  Intervention: Diet modifications  Pt primarily edentulous- continue with dysphagia 3 diet- pt ate 25% cheese omelet , muffin and canned peaches for breakfast this am.      Comments:                                                   Follow Up  Nutrition Note Template      Last weight   01/18/13 55.339 kg (122 lb)       Estimated Nutrient Needs:  Total Caloric Estimated Needs: 30 cal/kg- 1700-1850 cal./day  Needs based on (weight): Actual  Total Protein Estimated Needs: 63" ( s/p aka) 56.1kg  1.3-1.5 gm pro/kg ( for healing from recent surgery) 70- 85 gm pro/day  Needs based on (weight): Actual       67 y.o. year old female with PMH significant for CAD, CHF, COPD, CVA, VT, anemia, PVD and partial colectomy admitted with SBO, now s/p dx laparoscopy, LOA on 01/16/13. Met with pt this am who is on a dysphagia 3 diet, if GI c/o with eating suggest add a low fiber diet also. Pt agreeable  to restarting ensure 1.5 ( prefers strawberry) . Pt has a menu in room and will order meals daily.    Rudean Hitt , RD, CDN,  CNSC  Pager [phone]

## 2013-01-20 NOTE — Progress Notes (Signed)
Sw Called [place] in Admissions at [facility] to inquire about bed hold status as there was only 1 day left in previous notes. Melissa States that they had to give away her private room but as long as she is not on any precautions that cause a need for a private then she can return. Sw was notified by the medical team that pt could possibly go tomorrow, pending some possible pneumonia and need for antibiotics. SW will continue to follow and will update the [facility].   [name] LMSW  10:29 AM  01/20/2013

## 2013-01-20 NOTE — Progress Notes (Signed)
Physical Therapy    Initial evaluation completed.       01/20/13 1118   Prior Living    Prior Living Situation Reported by patient   Lives With SNF   Receives Help From Personal care attendant   Type of Home Facility (SNF)   Medical Equipment in [facility] Bed;Mechanical Lift   Prior Function Level   Prior Function Level Reported by patient   Transfers Total   Wheelchair mobility Required assistance   Additional Comments hoyer lift per pt   PT Tracking   PT TRACKING PT Coverage   Visit Number   Visit Number 1   Precautions/Observations   Precautions used x   LDA Observation IV lines;O2 (comment);Monitors   Isolation Precautions Contact   Fall Precautions General falls precautions   Pain Assessment   *Is the patient currently in pain? Denies   Vision    Current Vision Not tested   Cognition   Cognition X   Overall Cognitive Status Impaired   Orientation Oriented to person;Oriented to place;Disoriented to time;Disoriented to situation   Following Commands Follows simple commands with increased time;Follows simple commands with repetition   LE Assessment   LE Assessment Impaired AROM RLE;Impaired AROM LLE  (hip flexion to 90 degrees bilaterally)   Bed Mobility   Bed mobility x   Rolling Maximum assist to left;Maximum assist to right   Supine to Sit Dependent;1 person assist   Sit to Supine Dependent;1 person assist   Additional comments Sat at EOB, SpO2 99%. Not able to hold self in sitting   Balance   Balance x   Sitting - Static Maximum assist;Supported   Sitting - Dynamic Max assist;Supported   Assessment   Brief Assessment Appropriate for skilled therapy   Problem List Impaired LE strength;Impaired LE ROM;Impaired balance;Impaired endurance;Impaired transfers;Impaired bed mobility   Patient / Family Goal get stronger   Plan/Recommendation   Treatment Interventions Restorative PT;Transfers training;Balance training;Strengthening   PT Frequency 3-5x/wk;30 minute sessions   Hospital Stay Recommendations Out of bed  with nursing assist  [facility] lift)   Discharge Recommendations Skilled Nursing Facility Rehab   PT Discharge Equipment Recommended None   Assessment/Recommendations Reviewed With: Patient;Social Worker   Time Calculation   PT Untimed Codes 17   PT Total Treatment 17   PT Charges   PT [facility] Charges Eval 15   Fritz Pickerel, [place]

## 2013-01-20 NOTE — Progress Notes (Addendum)
General Surgery    Subjective     Desaturated to 88% overnight, improved with increase in O2 to 4 LNC. Offers no other complaints.     Objective     Temp (24hrs), Avg:36.5 C (97.7 F), Min:36 C (96.8 F), Max:36.9 C (98.4 F)      I/O last 3 completed shifts:  12/08 0700 - 12/09 0659  In: 1190 (21.5 mL/kg) [P.O.:1140; I.V.:50 (0 mL/kg/hr)]  Out: 250 (4.5 mL/kg) [Urine:250 (0.2 mL/kg/hr)]  Net: 940  Weight used: 55.3 kg    Physical Examination:    Blood pressure 128/86, pulse 69, temperature 36.3 C (97.3 F), temperature source Axillary, resp. rate 18, height 1.6 m (5\' 3" ), weight 55.339 kg (122 lb), SpO2 95.00%.    General: NAD, comfortable.   Pulm/Lungs:  Clear anteriorly.   CV: RRR  GI/Abdomen: soft, non tender, non distended. Incisions are clean, dry and intact.   Extremities: warm, perfused. S/p L AKA--stump appears healthy.     Labs:    Recent Labs  Lab 01/20/13  0301 01/19/13  0346   Sodium 144 145   Potassium 3.6 4.1   Chloride 108 109*   CO2 25 22   UN 7 8   Creatinine 0.46* 0.86   Glucose 96 68   Calcium 8.3* 8.4*   Magnesium 1.5 1.4   Phosphorus 2.6* 2.8      Recent Labs  Lab 01/20/13  0301 01/19/13  0346  01/15/13  2328   WBC 11.3* 9.7  < >  --    Hemoglobin 8.2* 7.9*  < >  --    Hematocrit 26* 26*  < >  --    Platelets 164 198  < >  --    Protime  --   --   --  11.7   INR  --   --   --  1.1   < > = values in this interval not displayed.   Recent Labs  Lab 01/15/13  2342 01/15/13  0800 01/14/13  0038   Albumin 2.5* 2.4* 2.5*   Prealbumin  --   --  12*        Assessment and Plan     67 y.o. year old female with PMH significant for CAD, CHF, COPD, CVA, VT s/p AICD, anemia, PVD and partial colectomy admitted with SBO, now s/p dx laparoscopy, LOA on 01/16/13.     CV: Amiodarone. Home coreg. Stop IV metoprolol.  Pulmonary: Aggressive pulmonary toilet. Will check CXR this am.   GI/Hepatic: dysphagia III diet. Tolerating well. Having BMs.   Renal: good uop.   ID: WBC 11 from 9. Afebrile. Repeat CBC in AM.     Endo: normal BGs.   Neuro/Sedation/Pain: doing well. Not requiring any pain medications.   Prophylaxis: heparin, protonix.     Everlene Other, MD   9:24 AM      I saw and evaluated the patient. I agree with the resident's/fellow's findings and plan of care as documented above. Details of my evaluation are as follows: looks good.  Speaking.  Family present.  Has pneumonia on CXR.   This was present on previous xray, but not as severe.  Would treat with oral agent and hopefully can be discharged to SNF soon.  Discussed with patient and family.     Roe Rutherford, MD

## 2013-01-20 NOTE — Plan of Care (Signed)
Problem: Impaired Bed Mobility  Goal: STG - IMPROVE BED MOBILITY  Patient will perform bed mobility with rails and the head of bed up with Moderate assist of 1    Time frame: 5-7 days    Problem: Decreased Balance  Goal: STG - IMPROVE BALANCE    Patient will be able to sit with upper extremity support at edge of bed with Minimal assist for 1 minutes      Time Frame: 3-5 days

## 2013-01-21 LAB — CBC AND DIFFERENTIAL
Baso # K/uL: 0 10*3/uL (ref 0.0–0.1)
Basophil %: 0.1 % (ref 0.1–1.2)
Eos # K/uL: 0.1 10*3/uL (ref 0.0–0.4)
Eosinophil %: 0.8 % (ref 0.7–5.8)
Hematocrit: 27 % — ABNORMAL LOW (ref 34–45)
Hemoglobin: 8.6 g/dL — ABNORMAL LOW (ref 11.2–15.7)
Lymph # K/uL: 2.3 10*3/uL (ref 1.2–3.7)
Lymphocyte %: 18.8 % — ABNORMAL LOW (ref 19.3–51.7)
MCH: 30 pg (ref 26–32)
MCHC: 32 g/dL (ref 32–36)
MCV: 95 fL (ref 79–95)
Mono # K/uL: 1.3 10*3/uL — ABNORMAL HIGH (ref 0.2–0.9)
Monocyte %: 10.5 % (ref 4.7–12.5)
Neut # K/uL: 8.5 10*3/uL — ABNORMAL HIGH (ref 1.6–6.1)
Platelets: 228 10*3/uL (ref 160–370)
RBC: 2.8 MIL/uL — ABNORMAL LOW (ref 3.9–5.2)
RDW: 16.3 % — ABNORMAL HIGH (ref 11.7–14.4)
Seg Neut %: 68.9 % (ref 34.0–71.1)
WBC: 12.4 10*3/uL — ABNORMAL HIGH (ref 4.0–10.0)

## 2013-01-21 LAB — BASIC METABOLIC PANEL
Anion Gap: 15 (ref 7–16)
CO2: 23 mmol/L (ref 20–28)
Calcium: 8.5 mg/dL — ABNORMAL LOW (ref 8.6–10.2)
Chloride: 107 mmol/L (ref 96–108)
Creatinine: 0.81 mg/dL (ref 0.51–0.95)
GFR,Black: 86 *
GFR,Caucasian: 75 *
Glucose: 96 mg/dL (ref 60–99)
Lab: 8 mg/dL (ref 6–20)
Potassium: 4.2 mmol/L (ref 3.3–5.1)
Sodium: 145 mmol/L (ref 133–145)

## 2013-01-21 LAB — POCT GLUCOSE
Glucose POCT: 104 mg/dL — ABNORMAL HIGH (ref 60–99)
Glucose POCT: 113 mg/dL — ABNORMAL HIGH (ref 60–99)

## 2013-01-21 LAB — MAGNESIUM: Magnesium: 1.7 mEq/L (ref 1.3–2.1)

## 2013-01-21 LAB — PHOSPHORUS: Phosphorus: 3.6 mg/dL (ref 2.7–4.5)

## 2013-01-21 MED ORDER — LACTINEX PO CHEW *I*
1.0000 | CHEWABLE_TABLET | Freq: Three times a day (TID) | ORAL | Status: DC
Start: 2013-01-21 — End: 2013-01-23
  Administered 2013-01-21 – 2013-01-23 (×5): 1 via ORAL
  Filled 2013-01-21 (×7): qty 1

## 2013-01-21 MED ORDER — BISACODYL 10 MG RE SUPP *I*
10.0000 mg | Freq: Every day | RECTAL | Status: DC
Start: 2013-01-21 — End: 2013-01-23

## 2013-01-21 MED ORDER — AMOXICILLIN-POT CLAVULANATE 500-125 MG PO TABS *I*
1.0000 | ORAL_TABLET | Freq: Three times a day (TID) | ORAL | Status: DC
Start: 2013-01-21 — End: 2013-01-23
  Administered 2013-01-21 – 2013-01-23 (×5): 1 via ORAL
  Filled 2013-01-21 (×5): qty 1

## 2013-01-21 NOTE — Progress Notes (Addendum)
General Surgery    Subjective     Remains on 4LNC, unable to wean oxygen currently. Started on zosyn for pneumonia. No BM yesterday. Tolerating diet.    Objective     Temp (24hrs), Avg:36.9 C (98.4 F), Min:36.3 C (97.3 F), Max:37.6 C (99.6 F)      I/O last 3 completed shifts:  12/09 0700 - 12/10 0659  In: 925 (15.6 mL/kg) [P.O.:300; IV Piggyback:625]  Out: 0 (0 mL/kg)   Net: 925  Weight used: 59.2 kg    Physical Examination:    Blood pressure 126/80, pulse 79, temperature 36.3 C (97.3 F), temperature source Temporal, resp. rate 18, height 1.6 m (5\' 3" ), weight 59.24 kg (130 lb 9.6 oz), SpO2 95.00%.    General: NAD, comfortable.   Pulm/Lungs:  Clear anteriorly, mildly diminished at right base.   CV: RRR  GI/Abdomen: soft, non tender, mildly distended. Incisions are clean, dry and intact.   Extremities: warm, perfused. S/p L AKA    Labs:    Recent Labs  Lab 01/20/13  0301 01/19/13  0346   Sodium 144 145   Potassium 3.6 4.1   Chloride 108 109*   CO2 25 22   UN 7 8   Creatinine 0.46* 0.86   Glucose 96 68   Calcium 8.3* 8.4*   Magnesium 1.5 1.4   Phosphorus 2.6* 2.8      Recent Labs  Lab 01/20/13  0301 01/19/13  0346  01/15/13  2328   WBC 11.3* 9.7  < >  --    Hemoglobin 8.2* 7.9*  < >  --    Hematocrit 26* 26*  < >  --    Platelets 164 198  < >  --    Protime  --   --   --  11.7   INR  --   --   --  1.1   < > = values in this interval not displayed.   Recent Labs  Lab 01/15/13  2342 01/15/13  0800   Albumin 2.5* 2.4*        Assessment and Plan     67 y.o. year old female with PMH significant for CAD, CHF, COPD, CVA, VT s/p AICD, anemia, PVD and partial colectomy admitted with SBO, now s/p dx laparoscopy, LOA on 01/16/13.     CV: Amiodarone. Home coreg. No issues, doing well on current regimen.   Pulmonary: pneumonia on CXR yesterday. Remains on O2 via NC, will wean as tolerated. Started on antibiotics.   GI/Hepatic: dysphagia III diet. No BM yesterday, will monitor today as patient seems a bit more distended  today. May need bowel regimen.   Renal: good uop, monitor.   ID: awaiting CBC this am. Started on zosyn yesterday for pneumonia--possible transition to oral regimen later today or tomorrow. Will check HH guidelines for recommendations for oral abx. Will contact ID for possible transition off precautions for c diff.    Endo: normal BGs.   Neuro/Sedation/Pain: no issues. Pain controlled without medications. On home sinemet, zoloft.    Prophylaxis: heparin, protonix.     Everlene Other, MD   7:50 AM      I saw and evaluated the patient. I agree with the resident's/fellow's findings and plan of care as documented above.    Roe Rutherford, MD

## 2013-01-21 NOTE — Progress Notes (Signed)
IV placed this moring by VAT team infiltrated.   VAT team and SWAT team unable to obtain further access.  Patients nurse, Pecola Leisure. Was notified of no IV access. Both arms very edematous and sore to touch.   Patients nurse stated she will inform MD's

## 2013-01-21 NOTE — Progress Notes (Signed)
Nursing supervisor note:     CTSP for IV access and am lab draw. Writer tried with the venascope 2xs  unsuccessfully to obtain IV access or labs. RN aware that patient still w/o IV or labs

## 2013-01-21 NOTE — Progress Notes (Signed)
Physical Therapy    Treatment session completed.       01/21/13 1500   PT Tracking   PT TRACKING PT Assigned   Visit Number   Visit Number 2   Precautions/Observations   Precautions used x   LDA Observation O2 (comment);Monitors   Isolation Precautions Contact   Fall Precautions General falls precautions   Pain Assessment   *Is the patient currently in pain? Denies   Cognition   Following Commands Follows simple commands with increased time;Follows simple commands with repetition   Bed Mobility   Bed mobility x   Rolling Moderate assist to right;Moderate assist to left;Verbal cues;Side rails up (#)   Supine to Sit Dependent   Sit to Supine Dependent   Additional comments sat EOB x 3 min with max assist   Therapeutic Exercises   Exercises Performed LE   Ankle Pumps-Reps 15   Heel Slides, Supine Right   Heel Slides, Supine-Assist Active assisted   Heel Slides, Supine-Reps 15   Hip Abduction, Supine Right   Hip Abduction, Supine-Assist Active assisted   Hip Abduction, Supine-Reps 15   Knee Extension, Supine (SAQ) Right   Knee Ext, Supine (SAQ)-Assist Active   Knee Ext, Supine (SAQ)-Reps 15   SLR Right   SLR-Assist Active assisted   SLR-Reps 15   Balance   Balance x   Sitting - Static Maximum assist;Supported   Sitting - Dynamic Not tested   Assessment   Brief Assessment Remains appropriate for skilled therapy   Plan/Recommendation   Treatment Interventions Restorative PT   PT Frequency 3-5x/wk;30 minute sessions   Hospital Stay Recommendations Out of bed with nursing assist  (hoyer lift)   Discharge Recommendations Skilled Nursing Facility Rehab   PT Discharge Equipment Recommended None   Assessment/Recommendations Reviewed With: Patient;Nursing   Time Calculation   PT Timed Codes 18   PT Untimed Codes 0   PT Unbilled Time 0   PT Total Treatment 18   PT Charges   PT [facility] Charges Therapeutic Activity (15 min)     Kassi Bolduc,PT

## 2013-01-22 LAB — POCT GLUCOSE: Glucose POCT: 114 mg/dL — ABNORMAL HIGH (ref 60–99)

## 2013-01-22 NOTE — Progress Notes (Signed)
Pt. De-sating to low 80's on 4L O2 via NC. Surgical tem II aware, flow rate increased to 5L via NC.  Will continue to monitor  Donnella Sham, RN

## 2013-01-22 NOTE — Progress Notes (Addendum)
General Surgery    Subjective     Weaning off oxygen slowly, down to [facility] this morning. Unable to replace IV yesterday.     Objective     Temp (24hrs), Avg:36.6 C (97.9 F), Min:36.2 C (97.2 F), Max:37.2 C (99 F)      I/O last 3 completed shifts:  12/09 2300 - 12/10 2259  In: 280 (4.7 mL/kg) [P.O.:180; IV Piggyback:100]  Out: - (0 mL/kg)   Net: 280  Weight used: 59.2 kg    Physical Examination:    Blood pressure 126/70, pulse 79, temperature 36.8 C (98.2 F), temperature source Oral, resp. rate 18, height 1.6 m (5\' 3" ), weight 59.24 kg (130 lb 9.6 oz), SpO2 95.00%.    General: NAD, comfortable.   Pulm/Lungs:  Clear bilaterally.    CV: RRR  GI/Abdomen: soft, non tender, mildly distended but unchanged from yesterday's exam. Incisions are clean, dry and intact, healing well.   Extremities: warm, perfused. S/p L AKA    Labs:    Recent Labs  Lab 01/21/13  0930 01/20/13  0301   Sodium 145 144   Potassium 4.2 3.6   Chloride 107 108   CO2 23 25   UN 8 7   Creatinine 0.81 0.46*   Glucose 96 96   Calcium 8.5* 8.3*   Magnesium 1.7 1.5   Phosphorus 3.6 2.6*      Recent Labs  Lab 01/21/13  0930 01/20/13  0301  01/15/13  2328   WBC 12.4* 11.3*  < >  --    Hemoglobin 8.6* 8.2*  < >  --    Hematocrit 27* 26*  < >  --    Platelets 228 164  < >  --    Protime  --   --   --  11.7   INR  --   --   --  1.1   < > = values in this interval not displayed.   Recent Labs  Lab 01/15/13  2342 01/15/13  0800   Albumin 2.5* 2.4*        Assessment and Plan     66 y.o. year old female with PMH significant for CAD, CHF, COPD, CVA, VT s/p AICD, anemia, PVD and partial colectomy admitted with SBO, now s/p dx laparoscopy, LOA on 01/16/13.     CV: Amiodarone. Home coreg.   Pulmonary: on augmentin for pneumonia. Wean O2 as able.    GI/Hepatic: dysphagia III diet. Suppository given yesterday. BM x 1 yesterday.   Renal: good uop, monitor.   ID: Augmentin for pneumonia.   Endo: normal BGs.   Neuro/Sedation/Pain: home sinemet, zoloft. Pain controlled.    Prophylaxis: heparin, protonix.     Dispo: home in 1-2 days on po abx.     Everlene Other, MD   6:28 AM        I saw and evaluated the patient. I agree with the resident's/fellow's findings and plan of care as documented above.    Roe Rutherford, MD

## 2013-01-22 NOTE — Progress Notes (Signed)
Physical Therapy    Treatment session completed.       01/22/13 1400   PT Tracking   PT TRACKING PT Assigned   Visit Number   Visit Number 3   Precautions/Observations   Precautions used x   LDA Observation O2 (comment)   Isolation Precautions Contact   Fall Precautions General falls precautions   Pain Assessment   *Is the patient currently in pain? Denies   Bed Mobility   Bed mobility x   Rolling Moderate assist to left;Side rails up (#);Head of bed elevated   Supine to Sit Dependent;2 person assist   Sit to Supine Dependent;2 person assist   Additional comments transfered to EOB x 2 reps with rest in between  (desat to 85% after sitting x 1 min)   Therapeutic Exercises   Ankle Pumps-Reps 10   Heel Slides, Supine Right   Heel Slides, Supine-Assist Active assisted   Heel Slides, Supine-Reps 10   Hip Abduction, Supine Right   Hip Abduction, Supine-Assist Active assisted   Hip Abduction, Supine-Reps 10   Knee Extension, Supine (SAQ) Right   Knee Ext, Supine (SAQ)-Assist Active assisted   Knee Ext, Supine (SAQ)-Reps 20   SLR Right   SLR-Assist Active assisted   SLR-Reps 10   Balance   Balance x   Sitting - Static Supported;Maximum assist   Sitting - Dynamic Not tested   Additional Comments sat x 1 min x 2 reps with max assist   Assessment   Brief Assessment Remains appropriate for skilled therapy   Plan/Recommendation   Treatment Interventions Restorative PT   PT Frequency 3-5x/wk;30 minute sessions   Hospital Stay Recommendations Out of bed with nursing assist  (hoyer lift)   Discharge Recommendations Skilled Nursing Facility Rehab   PT Discharge Equipment Recommended None   Assessment/Recommendations Reviewed With: Nursing;Patient;Social Worker   Time Calculation   PT Timed Codes 17   PT Untimed Codes 0   PT Unbilled Time 0   PT Total Treatment 17   PT Charges   PT HH Charges Therapeutic Activity (15 min)     Lianne Cure, PT

## 2013-01-23 LAB — CBC AND DIFFERENTIAL

## 2013-01-23 LAB — BASIC METABOLIC PANEL
Anion Gap: 11 (ref 7–16)
CO2: 25 mmol/L (ref 20–28)
Calcium: 8.1 mg/dL — ABNORMAL LOW (ref 8.6–10.2)
Chloride: 107 mmol/L (ref 96–108)
Creatinine: 0.78 mg/dL (ref 0.51–0.95)
GFR,Black: 90 *
GFR,Caucasian: 78 *
Glucose: 95 mg/dL (ref 60–99)
Lab: 8 mg/dL (ref 6–20)
Potassium: 3.3 mmol/L (ref 3.3–5.1)
Sodium: 143 mmol/L (ref 133–145)

## 2013-01-23 LAB — MAGNESIUM: Magnesium: 1.3 mEq/L (ref 1.3–2.1)

## 2013-01-23 LAB — PHOSPHORUS: Phosphorus: 2.5 mg/dL — ABNORMAL LOW (ref 2.7–4.5)

## 2013-01-23 MED ORDER — AMOXICILLIN-POT CLAVULANATE 500-125 MG PO TABS *I*
1.0000 | ORAL_TABLET | Freq: Three times a day (TID) | ORAL | Status: AC
Start: 2013-01-23 — End: 2013-01-30

## 2013-01-23 MED ORDER — LACTINEX PO CHEW *I*
1.0000 | CHEWABLE_TABLET | Freq: Three times a day (TID) | ORAL | Status: AC
Start: 2013-01-23 — End: ?

## 2013-01-23 NOTE — Progress Notes (Signed)
Called report at 12:40 pm, spoke with [place]phie on [place] [facility] at [facility]. Pt. To be picked up at aprox. 13:30 by [employer].  Donnella Sham, RN

## 2013-01-23 NOTE — Discharge Instructions (Signed)
Brief Summary of your Hospital Stay (including key procedures and diagnostic test results):  You were admitted 01/06/13 due to anorexia, and abdominal painwith lack of bowel movements. You were found to have low blood pressure and were sent to the ICU to get fluids and antibiotics (you had a history of C. Diff a month earlier so you empirically treated for this). CT scan was indicative of a small bowel obstruction and you were treated with bowel rest, fluids, and an NG tube. Your nausea and vomiting improved and you had return of bowel function. Your nausea and vomiting returned, however, and it was determined that you would need a surgical intervention to treat the small bowel obstruction. You were taken to the OR on 01/16/13 for exploratory laparotomy with lysis of adhesions. You tolerated the procedure well without complications. Your diet was advanced to a dysphagia 3 diet. You began to require more oxygen and an Xray was obtained. Compared to your xray on admission, you had a worsening Xray and you were started on augmentin to treat possible pneumonia.     On the day of discharge, you were ambulating, tolerating diet, voiding adequately on your own, and had good pain control.    Medications: Take all of your medications as written. You were sent home on antibiotics. Make sure to finish your entire course.    Pending Labs: none    Call Dr. Laural Benes, Julien Nordmann, MD or go to the nearest Emergency Department for evaluation of:  Fever of 101F. or greater  Nausea and / or vomiting  Not able to have a BM  Not able to pass gas     Diet: Dysphasia 3    Activity: up with assistance    Wound Care: Take a shower, pat incision dry    Pain Management: Per medication list below. It is important to keep your pain controlled so that you are able to take deep breaths and cough. However, the use of a narcotic pain medication can be constipating. Please continue to walk, drink plenty of fluids, and use an over the counter stool  softener such as Colace (Docusate Sodium) and/or Senna as needed for constipation. Do not operate heavy machinery, drive, or make important decisions while on narcotic pain medications because narcotics may impair your reflex speed and judgement. You may wean your narcotic pain medicines as tolerated to regular over-the-counter pain medicines such as acetaminophen (Tylenol - avoid if you have liver disease) or ibuprofen (Motrin, Advil - avoid if you have kidney disease). Be careful not to exceed 4000mg  of acetaminophen in a day from all sources (Tylenol with Codeine, Norco and Percocet contain acetaminophen).    Smoking: Smoking can reduce the quality of your wound healing and increase your chances of wound infections, as well as increase your chances of developing chronic health problems or worsen conditions you already have. If you smoke, you should quit. Smoking cessation information is available for your review to help you quit. Medications to help you quit are available. Ask your doctor (Reidy, Illene Labrador, MD) if you would like to receive these medications.    Diabetes: If you are diabetic, check your blood sugar at least daily. Poor blood sugar control can lead to delayed wound healing. Follow up with Reidy, Illene Labrador, MD to discuss your diabetic care regimen.    Other Instructions: Follow up as instructed.    Author: Anson Fret, MD as of: 01/23/2013  at: 10:47 AM

## 2013-01-23 NOTE — Progress Notes (Signed)
SW confirmed with medical team that pt is medically stable and safe for discharge back to long term care facility today. SW contacted pt  to inform of discharge time 1:30pm going to [facility] via [facility] ambulance due to oxygen needs. SW called Medicaid and obtained approval for the transport. SW requested updated PRI at this time.  SW informed the placement office of the time and method of transport. SW informed RN, Media planner, Press photographer, and pt at this time. SW will continue to be involved for any further discharge needs.      Nurse report number is [phone].  [name] LMSW  11:27 AM  01/23/2013

## 2013-01-23 NOTE — Continuity of Care (Signed)
NEW [facility] DEPARTMENT OF HEALTH  [facility]-Division of Quality and Surveillance for Nursing Homes and ICFs/MR  Patient Name  Michelle [name] MR Number  [id number] Account Number  [id number] Birthdate  [id number]    Hospital and Community                Patient Review Instrument  (HC-PRI)               RUG II:  CB6     I. ADMINISTRATIVE DATA     1. Operating Certificate Number  [id number] 2. Social Security Number  [id number]   3. Official Name of Hospital Completing this Review  [facility] SERVICE AREA    4A. Patient Name  Michelle [name] 10. Sex  female   4B. [place] Residence  [place] 11A. Date of Hospital Admission or Initial Agency Visit  01/06/2013   5. Date of [facility] Completion  January 23, 2013 11B. Date of Alternate level of Care Status in Hospital (if applicable)    6. Account Number  [id number] 12. Medicaid Number  Michelle [name],    MCRC,    [id number]  MEDICARE IME ONLY,    MCR,    [id number]  MEDICAID,    MCD,    [id number]       7. Hospital Room Number  [phone] 13. Medicare Number     8. Name of Unit/Division/Building  W624/W624-01 14. Primary Payor     9. Date of Birth  [phone] 71. Reason for [facility] Completion  1 - RHCF application from hospital     II. MEDICAL EVENTS     16. Decubitus Level:        Location:  No reddened skin or breakdown     17. MEDICAL CONDITIONS During the past week:  1=Yes  2=No 18. MEDICAL TREATMENTS 1=Yes  2=No   A. Comatose No A. Tracheostomy Care/Suct No         B. Dehydration Yes B. Suctioning/General No         C. Internal Bleeding No C. Oxygen (Daily) No              D. Stasis Ulcer No D Respiratory Care (Daily) No         E. Terminally Ill No E. Nasal Gastric Feeding No         F. Contractures No F. Parenteral Feeding No         G. Diabetes Mellitus No G. Wound Care No         H. Urinary Tract Infection No H. Chemotherapy No         I. HIV Infection Symptomatic No I. Transfusion No         J. Accident No J. Dialysis No         K. Ventilator Dependent No K. Bowel and Bladder Rehab No           L.  Catheter (Indwelling or External) No              M. Physical Restraints  (Daytime) No             Adapted from [id number]    Version: 002F  Review Type: Update review  01/23/2013  2 of 6    NEW [facility] DEPARTMENT OF HEALTH  [facility]-Division of Quality and Surveillance for Nursing Homes and ICFs/MR  Patient Name  Michelle [name] MR Number  [id number] Account Number  [id number] Birthdate  [id number]    Hospital and [employer]  Patient Review Instrument  (HC-PRI)               III. Activities of Daily Living     19. Eating 1-Feeds self without supervision or physical assistance.  May use adaptive equipment.    20. Mobility 5-Is wheeled, chairfast or bedfast. Relies on someone else to move about, if at all.    21. Transfer 4-Requires two people to provide constant supervision and/or physically lift.  May need lifting equipment.    22. Toileting 4-Incontinent of bowel and/or bladder and is not taken to a bathroom.      IV. BEHAVIORS     23. Verbal Disruption No known history   24. Physical Agression No known history   25. Disruptive, Infantile or Socially Inappropriate Behavior No known history   26. Hallucinations No     V. SPECIALIZED SERVICES     27A. Physical Therapy  27B. Occupational Therapy    Level: Receives therapy, but does not fulfill the qualifiers stated in the instructions. Level: Does not receive.   Actual days/week:3 No - less than 5 times/week Actual days/week::      Actual hours/week:1.0 No - less than 2.5 hrs/wk Actual hours/week:        28. Number of Physician Visits - 0     VI. DIAGNOSIS     29. Primary Problem: S/P laparoscopy  SBO, R thigh subcutaneous emphysema, acute kidney injury     VII. PLAN OF CARE SUMMARY     30. Primary Diagnosis: Encounter Diagnosis   Name Primary?   . SBO (small bowel obstruction) Yes   R thigh subcutaneous emphysema  Acute kidney injury  S/p laparoscopic lysis of extensive adhesions 01/16/13         PMH: See below         PSH:  has past surgical history that includes ICD;  hemicolectomy (Left); Cardiac defibrillator placement; Pacemaker insertion; and HH consult to PICC placement (12/10/2012).   31A. Rehabilitation Potential: Treatment Interventions: Restorative PT  PT Frequency: 3-5x/wk;30 minute sessions  Hospital Stay Recommendations: Out of bed with nursing assist (hoyer lift)  Discharge Recommendations: Skilled Nursing Facility Rehab  PT Discharge Equipment Recommended: None  Assessment/Recommendations Reviewed With:: Nursing;Patient;Child psychotherapist.     .                 Comment:    31B. Current therapy care plan: Treatment Interventions: Restorative PT  PT Frequency: 3-5x/wk;30 minute sessions  Hospital Stay Recommendations: Out of bed with nursing assist (hoyer lift)  Discharge Recommendations: Skilled Nursing Facility Rehab  PT Discharge Equipment Recommended: None  Assessment/Recommendations Reviewed With:: Nursing;Patient;Social Worker            Comment:    32. Medications: See below   33A. Allergies: Review of patient's allergies indicates no known allergies (drug, envir, food or latex).   33B: Treatments: See d/c instructions   33C: Abnormal Labs: See below   33D: Precautions:  falls, aspiration, contact           Comment:    33E: Pacemaker Permanent   27F: Diet:  dysphagia 3          34. Race/Ethnic Group White or Caucasian [1]     35. Certification    I have personally observed/interviewed this patient and completed this H/C PRI. No - Staff/Chart           I certify that the information contained herein is a true abstract of this patient's condition and medical  record. Yes   Certified Assessor: Lance Muss, RN   Identification Number: [id number]             Adapted from [id number]    Version: 002F  Review Type: Update review  01/23/2013  1 of 1    PMH:    Past Medical History   Diagnosis Date   . CVA (cerebral infarction) 10/23/2011   . ASHD (arteriosclerotic heart disease) 10/23/2011   . MI (mitral incompetence) 10/23/2011   . CHF (congestive heart failure) 10/23/2011     EF  30%   . COPD (chronic obstructive pulmonary disease) 10/23/2011   . VT (ventricular tachycardia) 10/23/2011     S/p AICD   . Anemia of chronic disease 10/23/2011   . HTN (hypertension) 10/23/2011   . Parkinsonism    . Coronary artery disease    . AICD (automatic cardioverter/defibrillator) present    . Myocardial infarction june 2013   . Alcohol abuse, in remission    . Peripheral vascular disease      Left AKA       Medications:    Current Facility-Administered Medications   Medication Dose Route Frequency   . lactobacillus acidophilus & bulgar (LACTINEX) chewable tablet 1 tablet  1 tablet Oral TID WC   . amoxicillin-clavulanate (AUGMENTIN) 500-125 MG per tablet 1 tablet  1 tablet Oral Q8H SCH   . carvedilol (COREG) tablet 3.125 mg  3.125 mg Oral Daily   . pantoprazole (PROTONIX) EC tablet 40 mg  40 mg Oral QAM   . amiodarone (PACERONE) tablet 400 mg  400 mg Oral Daily   . carbidopa-levodopa (SINEMET) 25-100 MG per tablet 1 tablet  1 tablet Oral BID   . sertraline (ZOLOFT) tablet 50 mg  50 mg Per NG tube Daily   . ipratropium-albuterol (DUONEB) 0.5-2.5 (3) MG/3ML nebulization solution 3 mL  3 mL Nebulization 4x Daily   . heparin (porcine) SQ injection 5,000 Units  5,000 Units Subcutaneous Q8H   . miconazole (SECURA) 2 % cream   Topical Q12H SCH       Abnormal Labs:    Recent Results (from the past 72 hour(s))   POCT GLUCOSE    Collection Time     01/21/13  7:53 AM       Result Value Range    Glucose POCT 104 (*) 60 - 99 mg/dL   CBC AND DIFFERENTIAL    Collection Time     01/21/13  9:30 AM       Result Value Range    WBC 12.4 (*) 4.0 - 10.0 THOU/uL    RBC 2.8 (*) 3.9 - 5.2 MIL/uL    Hemoglobin 8.6 (*) 11.2 - 15.7 g/dL    Hematocrit 27 (*) 34 - 45 %    MCV 95  79 - 95 fL    MCH 30  26 - 32 pg    MCHC 32  32 - 36 g/dL    RDW 09.3 (*) 23.5 - 14.4 %    Platelets 228  160 - 370 THOU/uL    Seg Neut % 68.9  34.0 - 71.1 %    Lymphocyte % 18.8 (*) 19.3 - 51.7 %    Monocyte % 10.5  4.7 - 12.5 %    Eosinophil % 0.8  0.7 - 5.8 %     Basophil % 0.1  0.1 - 1.2 %    Neut # K/uL 8.5 (*) 1.6 - 6.1 THOU/uL    Lymph # K/uL 2.3  1.2 - 3.7 THOU/uL    Mono # K/uL 1.3 (*) 0.2 - 0.9 THOU/uL    Eos # K/uL 0.1  0.0 - 0.4 THOU/uL    Baso # K/uL 0.0  0.0 - 0.1 THOU/uL   MAGNESIUM    Collection Time     01/21/13  9:30 AM       Result Value Range    Magnesium 1.7  1.3 - 2.1 mEq/L   PHOSPHORUS    Collection Time     01/21/13  9:30 AM       Result Value Range    Phosphorus 3.6  2.7 - 4.5 mg/dL   BASIC METABOLIC PANEL    Collection Time     01/21/13  9:30 AM       Result Value Range    Glucose 96  60 - 99 mg/dL    Sodium 045  409 - 811 mmol/L    Potassium 4.2  3.3 - 5.1 mmol/L    Chloride 107  96 - 108 mmol/L    CO2 23  20 - 28 mmol/L    Anion Gap 15  7 - 16    UN 8  6 - 20 mg/dL    Creatinine 9.14  7.82 - 0.95 mg/dL    GFR,Caucasian 75      GFR,Black 86      Calcium 8.5 (*) 8.6 - 10.2 mg/dL   POCT GLUCOSE    Collection Time     01/21/13 11:36 AM       Result Value Range    Glucose POCT 113 (*) 60 - 99 mg/dL   POCT GLUCOSE    Collection Time     01/22/13  9:46 PM       Result Value Range    Glucose POCT 114 (*) 60 - 99 mg/dL   CBC AND DIFFERENTIAL    Collection Time     01/23/13  9:00 AM       Result Value Range    WBC CANCELED  4.0 - 10.0 THOU/uL   MAGNESIUM    Collection Time     01/23/13  9:00 AM       Result Value Range    Magnesium 1.3  1.3 - 2.1 mEq/L   PHOSPHORUS    Collection Time     01/23/13  9:00 AM       Result Value Range    Phosphorus 2.5 (*) 2.7 - 4.5 mg/dL   BASIC METABOLIC PANEL    Collection Time     01/23/13  9:00 AM       Result Value Range    Glucose 95  60 - 99 mg/dL    Sodium 956  213 - 086 mmol/L    Potassium 3.3  3.3 - 5.1 mmol/L    Chloride 107  96 - 108 mmol/L    CO2 25  20 - 28 mmol/L    Anion Gap 11  7 - 16    UN 8  6 - 20 mg/dL    Creatinine 5.78  4.69 - 0.95 mg/dL    GFR,Caucasian 78      GFR,Black 90      Calcium 8.1 (*) 8.6 - 10.2 mg/dL

## 2013-01-23 NOTE — Progress Notes (Signed)
Pt. Discharged via stretcher, VSS, nurse to nurse report done at 12:45. Pt. Looking forward to going back.  Donnella Sham, RN

## 2013-01-23 NOTE — Discharge Summary (Signed)
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 She was found to be hypotensive with EKG changes.  A Cardiology consult was call and echo done showing LVEF of 25 % with stable ASHD. CT abdomen pelvis showed SBO and an NG tube was placed. She was transferred to the ICU for further management of her hypotension.    A right femoral arterial line and CVC was placed. She received 5 liters of isotonic fluid when her BP stabilized and UO began to improve.  Fluid were stopped several hours later.  She was initially started on Vanco/Zosyn, which was changed to Cefepime/IV flagy for recent Hx of C-diff. The Cefepime was stopped with anticipation of the flagyl to be stopped in 24-48 hours.     There was little NG output and no nausea or abdominal pain after 36 hours and the NG was removed 11/27 after being connected to gravity for 6 hours. Essential PO medications such as coreg, Amiodarone, sinemet were restarted. A clear liquid tray was ordered. Patient was transferred to the floor.    While on the floor patient complained of N/V. Abdomen was distended and tympanic, a KUB showed distended bowel loops indicating SBO. Patient had NG tube inserted per surgery recommendations and failed conservative management after 5 days so she was subsequently taken to the OR on 01/16/13. She tolerated the procedure well and was advanced to a dysphagia 3 diet. She was determined fit for discharge on 01/23/13.               Signed: Anson Fret, MD  On: 01/23/2013  at: 11:19 AM

## 2013-01-23 NOTE — Progress Notes (Signed)
General Surgery    Subjective     Required increase in O2 yesterday afternoon. No complaints. Tolerating diet. Having BMs. Remains on abx for pneumonia.     Objective     Temp (24hrs), Avg:36.7 C (98.1 F), Min:36.3 C (97.3 F), Max:37.1 C (98.8 F)      I/O last 3 completed shifts:  12/11 0700 - 12/12 0659  In: 150 (2.5 mL/kg) [P.O.:150]  Out: 0 (0 mL/kg)   Net: 150  Weight used: 59.2 kg    Physical Examination:    Blood pressure 130/60, pulse 74, temperature 36.3 C (97.3 F), temperature source Temporal, resp. rate 18, height 1.6 m (5\' 3" ), weight 59.24 kg (130 lb 9.6 oz), SpO2 93.00%.    General: NAD, comfortable.   Pulm/Lungs:  Clear anteriorly.  Remains on O2 (5 LNC).   CV: RRR  GI/Abdomen: soft, non tender, minimally distended. Incisions are clean, dry and intact, healing well.   Extremities: warm, perfused. S/p L AKA--stump intact.     Labs:    Recent Labs  Lab 01/21/13  0930 01/20/13  0301   Sodium 145 144   Potassium 4.2 3.6   Chloride 107 108   CO2 23 25   UN 8 7   Creatinine 0.81 0.46*   Glucose 96 96   Calcium 8.5* 8.3*   Magnesium 1.7 1.5   Phosphorus 3.6 2.6*      Recent Labs  Lab 01/21/13  0930 01/20/13  0301   WBC 12.4* 11.3*   Hemoglobin 8.6* 8.2*   Hematocrit 27* 26*   Platelets 228 164    No results found for this basename: TB, DB, AST, ALT, ALK, TP, ALB, PALB, AMY, LIP,  in the last 168 hours     Assessment and Plan     67 y.o. year old female with PMH significant for CAD, CHF, COPD, CVA, VT s/p AICD, anemia, PVD and partial colectomy admitted with SBO, now s/p dx laparoscopy, LOA on 01/16/13.     CV: Amiodarone. Home coreg. No changes.   Pulmonary: on augmentin for pneumonia. Day 3/7.   GI/Hepatic: dysphagia III diet. Doing well, passing stools.   Renal: no issues.   ID: Augmentin for pneumonia day 3/7.  Endo: normal BGs.   Neuro/Sedation/Pain: home sinemet, zoloft. Pain controlled.   Prophylaxis: heparin, protonix.     Dispo: pending clearance of precautions, hopefully back to SNF in 1-2  days.     Everlene Other, MD   8:48 AM

## 2013-01-23 NOTE — Progress Notes (Signed)
[facility]  Transportation Request Form / Physician Certification Statement   Please fax request with face sheet to [facility] Transportation: Fax [phone], Phone [phone] or [employer]: [phone], Phone [phone]    Patient Name:  Michelle [name]     Date of Birth:  1945-06-18    Date of Service: 01/23/13  Time of pick up: 1:30pm Patient Location:  W624/W624-01       MR#  [id number] Patient Destination: [facility]   Number of steps into house?: 0  Requestor's Name: [name] Call Back Number:[phone]  Payor : Medicaid  Transport for (chechat type of treatment or service, at least one):    [x]  Discharge              []  Diagnostic Test      []  Evaluation Test         []  Procedure      Specify what type of treatment or service: long term care   Is this treatment or service available at sending facility?:      []  Yes    [x]  No  Requested Mode of Transport  []  One man Chairmobile** []  Two man Chairmobile**  (** See instruction  form)  Leg extension required:     []  Yes  []  No Patient has own chair:  []  Yes    []  N   Wheelchair size:        []  Standard []  X-wide []  XX-wide  []  Round Trip []  One Way  []  Stretcher [name] (no medical attention needed)    [x]  [facility]  []  [facility]  VENDOR: ____[place] [facility]_____   1. Medical condition that necessitates this mode of transport (i.e. oxygen, bed ridden, etc.):  Pt has oxygen needs due to COPD and Pneumonia  2. What medical services are to be provided by crew?   [x]  Oxygen Monitoring:  Amount:  2    LPM  Can patient self-administer oxygen?  []  Yes [x]  No - What is limitation?   No tank     Does patient have own portable tank?    []  Yes     [x]  No   []  Airway Monitoring []  Monitor IV infusion []  Suctioning   []  Cardiac Monitoring   []  Vital Signs []  PRN Meds  []  Other_______________________________  []  None  3. Infection control needs (i.e. ORSA/VRE/Cdiff)       []  Yes    [x]  No  4. What specific handling is required?    [x]  Positioning []  Orthopedic  Device    []  Restraints []  Other:        Reason for above:  []  Flight Risk     []  Severe Pain []  Morbid Obesity       [x]  Fall Precaution   []  Ortho/Spine Precaution         []  Decubitis > Stage 2 []  Risk of injury to self/others  5.  Patient mental status? [x]  Normal cognition []  Disoriented      Specify:        []  Psychological Disorder Specify:     6. At time of transport is bed confinement ordered?  []  Yes     [x]  No              Is patient bed confined?    []  Yes     [x]  No    Medical condition for bed confinement:     Name:  [name]  Date:  January 23, 2013        Signature:___________________________________________________

## 2013-01-26 ENCOUNTER — Ambulatory Visit
Admit: 2013-01-26 | Discharge: 2013-01-26 | Disposition: A | Discharge status: Home / Self Care | Payer: Self-pay | Source: Ambulatory Visit | Attending: Geriatric Medicine | Admitting: Geriatric Medicine

## 2013-01-26 LAB — BASIC METABOLIC PANEL
Anion Gap: 10 (ref 7–16)
CO2: 27 mmol/L (ref 20–28)
Calcium: 7.7 mg/dL — ABNORMAL LOW (ref 8.6–10.2)
Chloride: 110 mmol/L — ABNORMAL HIGH (ref 96–108)
Creatinine: 0.91 mg/dL (ref 0.51–0.95)
GFR,Black: 75 *
GFR,Caucasian: 65 *
Lab: 11 mg/dL (ref 6–20)
Potassium: 4.3 mmol/L (ref 3.3–5.1)
Sodium: 147 mmol/L — ABNORMAL HIGH (ref 133–145)

## 2013-01-26 LAB — CBC
Hematocrit: 24 % — ABNORMAL LOW (ref 34–45)
Hemoglobin: 7.8 g/dL — ABNORMAL LOW (ref 11.2–15.7)
MCH: 31 pg/cell (ref 26–32)
MCHC: 33 g/dL (ref 32–36)
MCV: 94 fL (ref 79–95)
Platelets: 228 10*3/uL (ref 160–370)
RBC: 2.5 MIL/uL — ABNORMAL LOW (ref 3.9–5.2)
RDW: 16.1 % — ABNORMAL HIGH (ref 11.7–14.4)
WBC: 9.6 10*3/uL (ref 4.0–10.0)

## 2013-01-26 LAB — GLUCOSE: Glucose: 77 mg/dL (ref 74–106)

## 2013-01-27 ENCOUNTER — Ambulatory Visit
Admit: 2013-01-27 | Discharge: 2013-01-27 | Disposition: A | Discharge status: Home / Self Care | Payer: Self-pay | Source: Ambulatory Visit | Admitting: Geriatric Medicine

## 2013-01-28 ENCOUNTER — Ambulatory Visit
Admit: 2013-01-28 | Discharge: 2013-01-28 | Disposition: A | Discharge status: Home / Self Care | Payer: Self-pay | Source: Ambulatory Visit | Attending: Geriatric Medicine | Admitting: Geriatric Medicine

## 2013-01-28 LAB — BASIC METABOLIC PANEL
Anion Gap: 7 (ref 7–16)
CO2: 29 mmol/L — ABNORMAL HIGH (ref 20–28)
Calcium: 7.6 mg/dL — ABNORMAL LOW (ref 8.6–10.2)
Chloride: 110 mmol/L — ABNORMAL HIGH (ref 96–108)
Creatinine: 0.86 mg/dL (ref 0.51–0.95)
GFR,Black: 80 *
GFR,Caucasian: 70 *
Lab: 10 mg/dL (ref 6–20)
Potassium: 4 mmol/L (ref 3.3–5.1)
Sodium: 146 mmol/L — ABNORMAL HIGH (ref 133–145)

## 2013-01-28 LAB — CBC AND DIFFERENTIAL
Baso # K/uL: 0 10*3/uL (ref 0.0–0.1)
Basophil %: 0.1 % (ref 0.1–1.2)
Eos # K/uL: 0.2 10*3/uL (ref 0.0–0.4)
Eosinophil %: 2.7 % (ref 0.7–5.8)
Hematocrit: 24 % — ABNORMAL LOW (ref 34–45)
Hemoglobin: 7.7 g/dL — ABNORMAL LOW (ref 11.2–15.7)
Lymph # K/uL: 2 10*3/uL (ref 1.2–3.7)
Lymphocyte %: 24.3 % (ref 19.3–51.7)
MCH: 31 pg/cell (ref 26–32)
MCHC: 32 g/dL (ref 32–36)
MCV: 96 fL — ABNORMAL HIGH (ref 79–95)
Mono # K/uL: 0.7 10*3/uL (ref 0.2–0.9)
Monocyte %: 8.1 % (ref 4.7–12.5)
Neut # K/uL: 5.3 10*3/uL (ref 1.6–6.1)
Platelets: 211 10*3/uL (ref 160–370)
RBC: 2.5 MIL/uL — ABNORMAL LOW (ref 3.9–5.2)
RDW: 15.9 % — ABNORMAL HIGH (ref 11.7–14.4)
Seg Neut %: 64.8 % (ref 34.0–71.1)
WBC: 8.1 10*3/uL (ref 4.0–10.0)

## 2013-01-28 LAB — OCCULT BLOOD X 1, STOOL: Card 1 Time: 2030

## 2013-01-28 LAB — GLUCOSE: Glucose: 80 mg/dL (ref 74–106)

## 2013-01-30 ENCOUNTER — Ambulatory Visit
Admit: 2013-01-30 | Discharge: 2013-01-30 | Disposition: A | Discharge status: Home / Self Care | Payer: Self-pay | Source: Ambulatory Visit | Attending: Geriatric Medicine | Admitting: Geriatric Medicine

## 2013-01-30 LAB — BASIC METABOLIC PANEL
Anion Gap: 13 (ref 7–16)
CO2: 23 mmol/L (ref 20–28)
Calcium: 7.5 mg/dL — ABNORMAL LOW (ref 8.6–10.2)
Chloride: 108 mmol/L (ref 96–108)
Creatinine: 0.77 mg/dL (ref 0.51–0.95)
GFR,Black: 92 *
GFR,Caucasian: 80 *
Glucose: 78 mg/dL (ref 60–99)
Lab: 8 mg/dL (ref 6–20)
Potassium: 4.5 mmol/L (ref 3.3–5.1)
Sodium: 144 mmol/L (ref 133–145)

## 2013-01-30 LAB — GLUC COMMENT

## 2013-02-01 ENCOUNTER — Ambulatory Visit
Admit: 2013-02-01 | Discharge: 2013-02-01 | Disposition: A | Discharge status: Home / Self Care | Payer: Self-pay | Source: Ambulatory Visit | Attending: Geriatric Medicine | Admitting: Geriatric Medicine

## 2013-02-01 LAB — COMPREHENSIVE METABOLIC PANEL
ALT: <5 U/L (ref 0–35)
AST: 20 U/L (ref 0–35)
Albumin: 2.3 g/dL — ABNORMAL LOW (ref 3.5–5.2)
Alk Phos: 152 U/L — ABNORMAL HIGH (ref 35–105)
Anion Gap: 14 (ref 7–16)
Bilirubin,Total: 0.3 mg/dL (ref 0.0–1.2)
CO2: 23 mmol/L (ref 20–28)
Calcium: 7.3 mg/dL — ABNORMAL LOW (ref 8.6–10.2)
Chloride: 107 mmol/L (ref 96–108)
Creatinine: 0.68 mg/dL (ref 0.51–0.95)
GFR,Black: 104 *
GFR,Caucasian: 90 *
Glucose: 111 mg/dL — ABNORMAL HIGH (ref 60–99)
Lab: 7 mg/dL (ref 6–20)
Potassium: 3.4 mmol/L (ref 3.3–5.1)
Sodium: 144 mmol/L (ref 133–145)
Total Protein: 5.3 g/dL — ABNORMAL LOW (ref 6.3–7.7)

## 2013-02-01 LAB — VITAMIN B12: Vitamin B12: >2000 pg/mL — ABNORMAL HIGH (ref 211–946)

## 2013-02-01 LAB — CBC
Hematocrit: 26 % — ABNORMAL LOW (ref 34–45)
Hemoglobin: 8.4 g/dL — ABNORMAL LOW (ref 11.2–15.7)
MCH: 31 pg/cell (ref 26–32)
MCHC: 32 g/dL (ref 32–36)
MCV: 95 fL (ref 79–95)
Platelets: 225 10*3/uL (ref 160–370)
RBC: 2.7 MIL/uL — ABNORMAL LOW (ref 3.9–5.2)
RDW: 15.8 % — ABNORMAL HIGH (ref 11.7–14.4)
WBC: 7 10*3/uL (ref 4.0–10.0)

## 2013-02-01 LAB — FERRITIN: Ferritin: 1147 ng/mL — ABNORMAL HIGH (ref 10–120)

## 2013-02-01 LAB — TIBC
Iron: 20 ug/dL — ABNORMAL LOW (ref 34–165)
TIBC: 111 ug/dL — ABNORMAL LOW (ref 250–450)
Transferrin Saturation: 18 % (ref 15–50)

## 2013-02-01 LAB — FOLATE: Folate: 9.4 ng/mL (ref >=4.6)

## 2013-02-10 ENCOUNTER — Ambulatory Visit
Admit: 2013-02-10 | Discharge: 2013-02-10 | Disposition: A | Discharge status: Home / Self Care | Payer: Self-pay | Source: Ambulatory Visit | Attending: Geriatric Medicine | Admitting: Geriatric Medicine

## 2013-02-24 ENCOUNTER — Encounter: Payer: Self-pay | Admitting: Geriatric Medicine

## 2013-02-24 ENCOUNTER — Non-Acute Institutional Stay: Payer: Self-pay | Admitting: Geriatric Medicine

## 2013-02-24 DIAGNOSIS — D249 Benign neoplasm of unspecified breast: Secondary | ICD-10-CM | POA: Insufficient documentation

## 2013-02-24 NOTE — Progress Notes (Signed)
Geriatrics  [facility] Chronic Note    Patient Name: Michelle [name]   Patient DOB: 1945-03-22   Patient MR#: [id number]   Facility: [facility]   Unit:        Reason for Visit  Michelle [name] was seen today for follow-up of chronic conditions.    Interval History:  The patient says she is feeling fairly well at this time.  She has had some phantom  pain in the left leg but she says she is pretty comfortable today.  She is participating in physical l therapy for transfers and for wheelchair mobility.  She denies any chest pain or palpitations.  She denies lightheadednes.     Past Medical History:    Medical History:  Past Medical History   Diagnosis Date    CVA (cerebral infarction) 10/23/2011    ASHD (arteriosclerotic heart disease) 10/23/2011    MI (mitral incompetence) 10/23/2011    CHF (congestive heart failure) 10/23/2011     EF 30%    COPD (chronic obstructive pulmonary disease) 10/23/2011    VT (ventricular tachycardia) 10/23/2011     S/p AICD    Anemia of chronic disease 10/23/2011    HTN (hypertension) 10/23/2011    Parkinsonism     Coronary artery disease     AICD (automatic cardioverter/defibrillator) present     Myocardial infarction june 2013    Alcohol abuse, in remission     Peripheral vascular disease      Left AKA    Fibroadenoma      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  She also denies nausea vomiting or any abdominal pain. Skin she denies any itching or rash or pain or open area.  The wound on her left leg appears to be healing well.  She still has staples in place.  Musculoskeletal: She says her pain is under pretty  good control at this time she has had chronic back pain but it is not bothering her much at this time.        Physical Examination:  Filed Vitals:    02/24/13 1549   BP: 128/68   Pulse: 78   Resp: 14       Physical Exam On physical exam, the patient is sitting up in no acute distress.  Patient is alert and oriented x2.  Patient moves all extremities normally except for a left foot drop.  There is 3+ strength in the right arm and 2+ strength of the right leg she also has significant ataxia on the right arm, no tremor and no rigidity.  The head is atraumatic normocephalic.  There is no tenderness over the face sinuses or scalp.  The eyes show no scleral icterus, no conjunctival injection, no drainage from either eye.  The mouth reveals moist mucosa, the pharynx is noninjected and there are no exudates.  Exam of the neck reveals no JVD, no neck masses, no thyromegaly and the trachea is midline.  Exam of the chest reveals no chest  wall tenderness there is an AICD palpable in the left anterior chest wall, lungs are clear. Cardiac rhythm is regular at 78 per minute.  There is no murmur gallop or rub.  S1 and S2 are normal. Abdominal exam reveals no tenderness, no guarding, no mass, no organomegaly.  Bowel sounds are normal, there is no CVA tenderness.  Extremities revealed no edema, she has a left AK a and the wound is healed,, no calf tenderness.  There is no joint tenderness.  The skin reveals no open areas or rash.      Allergies  No Known Allergies (drug, envir, food or latex)     Medications:      Psychotropic Medications Zoloft 50 mg a day for anxiety and depression.  She is responding well to this and this will be continued.      Pain Management Tylenol 650 mg 4 times a day as needed.    Latest Laboratory Results  Lab Results   Component Value Date    NA 144 02/01/2013    K 3.4 02/01/2013    CL 107 02/01/2013    CO2 23 02/01/2013    UN 7 02/01/2013    CREAT 0.68 02/01/2013    VID25 30 01/07/2012    WBC 7.0  02/01/2013    HGB 8.4* 02/01/2013    HCT 26* 02/01/2013    PLT 225 02/01/2013    TSH 0.62 09/22/2012    CHOL 114 12/29/2012    TRIG 167* 12/29/2012    HDL 37 12/29/2012    LDLC 44 12/29/2012    CHHDC 3.1 12/29/2012       Goals of Care: The goals of care are: Longevity    Advanced Directives:     No limitations on medical interventions      Assessment/Plan: 1.  Coronary artery disease with congestive heart failure.  She is  Not re.quiring a diuretic at this time  We will continue with amiodarone because of the arrhythmias in the past we will continue with aspirin 81 mg a day.  She remains on a low dose of carvedilol 3.125  mg twice a day and that will be continued.  She does have a history of hyperlipidemia and is on Lipitor because of her coronary artery disease and she has had borderline elevation of her liver enzymes which we are monitoring.  They have remained in the same range but they're still slightly elevated this may be related to her past history of alcohol use and the levels of elevation are not high enough that we need to discontinue the Lipitor at this point but we will need to continue monitoring this if her liver enzymes continue to rise we may need to consider taking her off the Lipitor.  I will be rechecking liver enzymes in the near future 2. peripheral vascular disease area she is status post left AKA and she is receiving physical and occupational therapy for transfers and wheelchair mobility and she is participating very well.  .  COPD.  This is well compensated at present she has when necessary guaifenesin ordered but does not require any inhalers at this time.  3.  Parkinsonism she is on Sinemet seems to be benefiting from that and tolerating it quite well we will continue with the Sinemet as ordered.      Follow-up: 2 months        Provider Signature:   Cordelia Poche, MD     Date: 02/24/2013 Time:   3:54 PM

## 2013-02-25 ENCOUNTER — Ambulatory Visit
Admit: 2013-02-25 | Discharge: 2013-02-25 | Disposition: A | Discharge status: Home / Self Care | Payer: Self-pay | Source: Ambulatory Visit | Attending: Geriatric Medicine | Admitting: Geriatric Medicine

## 2013-02-25 LAB — AST: AST: 39 U/L — ABNORMAL HIGH (ref 0–35)

## 2013-02-25 LAB — ALT: ALT: 8 U/L (ref 0–35)

## 2013-02-27 ENCOUNTER — Ambulatory Visit
Admit: 2013-02-27 | Discharge: 2013-02-27 | Disposition: A | Discharge status: Home / Self Care | Payer: Self-pay | Source: Ambulatory Visit | Attending: Emergency Medicine | Admitting: Emergency Medicine

## 2013-02-27 LAB — CBC AND DIFFERENTIAL
Baso # K/uL: 0 10*3/uL (ref 0.0–0.1)
Basophil %: 0 % (ref 0.1–1.2)
Eos # K/uL: 0.1 10*3/uL (ref 0.0–0.4)
Eosinophil %: 1.4 % (ref 0.7–5.8)
Hematocrit: 25 % — ABNORMAL LOW (ref 34–45)
Hemoglobin: 8 g/dL — ABNORMAL LOW (ref 11.2–15.7)
Lymph # K/uL: 2.3 10*3/uL (ref 1.2–3.7)
Lymphocyte %: 25.7 % (ref 19.3–51.7)
MCH: 31 pg/cell (ref 26–32)
MCHC: 32 g/dL (ref 32–36)
MCV: 96 fL — ABNORMAL HIGH (ref 79–95)
Mono # K/uL: 1 10*3/uL — ABNORMAL HIGH (ref 0.2–0.9)
Monocyte %: 11 % (ref 4.7–12.5)
Neut # K/uL: 5.5 10*3/uL (ref 1.6–6.1)
Platelets: 155 10*3/uL — ABNORMAL LOW (ref 160–370)
RBC: 2.6 MIL/uL — ABNORMAL LOW (ref 3.9–5.2)
RDW: 19 % — ABNORMAL HIGH (ref 11.7–14.4)
Seg Neut %: 61.9 % (ref 34.0–71.1)
WBC: 8.8 10*3/uL (ref 4.0–10.0)

## 2013-02-27 LAB — COMPREHENSIVE METABOLIC PANEL
ALT: 10 U/L (ref 0–35)
AST: 46 U/L — ABNORMAL HIGH (ref 0–35)
Albumin: 2.2 g/dL — ABNORMAL LOW (ref 3.5–5.2)
Alk Phos: 168 U/L — ABNORMAL HIGH (ref 35–105)
Anion Gap: 13 (ref 7–16)
Bilirubin,Total: 0.7 mg/dL (ref 0.0–1.2)
CO2: 30 mmol/L — ABNORMAL HIGH (ref 20–28)
Calcium: 7.5 mg/dL — ABNORMAL LOW (ref 8.6–10.2)
Chloride: 106 mmol/L (ref 96–108)
Creatinine: 0.77 mg/dL (ref 0.51–0.95)
GFR,Black: 92 *
GFR,Caucasian: 80 *
Lab: 11 mg/dL (ref 6–20)
Potassium: 3.3 mmol/L (ref 3.3–5.1)
Sodium: 149 mmol/L — ABNORMAL HIGH (ref 133–145)
Total Protein: 5.3 g/dL — ABNORMAL LOW (ref 6.3–7.7)

## 2013-02-27 LAB — TSH: TSH: 0.26 u[IU]/mL — ABNORMAL LOW (ref 0.27–4.20)

## 2013-02-27 LAB — PREALBUMIN: Prealbumin: 15 mg/dL — ABNORMAL LOW (ref 20–40)

## 2013-02-27 LAB — GLUCOSE: Glucose: 76 mg/dL (ref 74–106)

## 2013-02-27 LAB — HEMOGLOBIN A1C: Hemoglobin A1C: 4.1 % (ref 4.0–6.0)

## 2013-03-02 ENCOUNTER — Ambulatory Visit
Admit: 2013-03-02 | Discharge: 2013-03-02 | Disposition: A | Discharge status: Home / Self Care | Payer: Self-pay | Source: Ambulatory Visit | Attending: Geriatric Medicine | Admitting: Geriatric Medicine

## 2013-03-02 LAB — T4, FREE: Free T4: 1.6 ng/dL (ref 0.9–1.7)

## 2013-03-02 LAB — T3: T3: 40 ng/dL — ABNORMAL LOW (ref 80–200)

## 2013-03-02 LAB — TSH: TSH: 0.18 u[IU]/mL — ABNORMAL LOW (ref 0.27–4.20)

## 2013-03-04 ENCOUNTER — Ambulatory Visit
Admit: 2013-03-04 | Discharge: 2013-03-04 | Disposition: A | Discharge status: Home / Self Care | Payer: Self-pay | Source: Ambulatory Visit | Attending: Geriatric Medicine | Admitting: Geriatric Medicine

## 2013-03-04 LAB — CBC AND DIFFERENTIAL
Baso # K/uL: 0 10*3/uL (ref 0.0–0.1)
Basophil %: 0.1 % (ref 0.1–1.2)
Eos # K/uL: 0.1 10*3/uL (ref 0.0–0.4)
Eosinophil %: 1.1 % (ref 0.7–5.8)
Hematocrit: 24 % — ABNORMAL LOW (ref 34–45)
Hemoglobin: 7.6 g/dL — ABNORMAL LOW (ref 11.2–15.7)
Lymph # K/uL: 2.5 10*3/uL (ref 1.2–3.7)
Lymphocyte %: 27.1 % (ref 19.3–51.7)
MCH: 30 pg/cell (ref 26–32)
MCHC: 31 g/dL — ABNORMAL LOW (ref 32–36)
MCV: 95 fL (ref 79–95)
Mono # K/uL: 1.1 10*3/uL — ABNORMAL HIGH (ref 0.2–0.9)
Monocyte %: 11.4 % (ref 4.7–12.5)
Neut # K/uL: 5.6 10*3/uL (ref 1.6–6.1)
Platelets: 169 10*3/uL (ref 160–370)
RBC: 2.6 MIL/uL — ABNORMAL LOW (ref 3.9–5.2)
RDW: 18.7 % — ABNORMAL HIGH (ref 11.7–14.4)
Seg Neut %: 60.3 % (ref 34.0–71.1)
WBC: 9.2 10*3/uL (ref 4.0–10.0)

## 2013-03-04 LAB — BASIC METABOLIC PANEL
Anion Gap: 10 (ref 7–16)
CO2: 32 mmol/L — ABNORMAL HIGH (ref 20–28)
Calcium: 7.4 mg/dL — ABNORMAL LOW (ref 8.6–10.2)
Chloride: 105 mmol/L (ref 96–108)
Creatinine: 0.81 mg/dL (ref 0.51–0.95)
GFR,Black: 86 *
GFR,Caucasian: 75 *
Lab: 10 mg/dL (ref 6–20)
Potassium: 4.6 mmol/L (ref 3.3–5.1)
Sodium: 147 mmol/L — ABNORMAL HIGH (ref 133–145)

## 2013-03-04 LAB — GLUCOSE: Glucose: 71 mg/dL — ABNORMAL LOW (ref 74–106)

## 2013-03-04 LAB — T4, FREE: Free T4: 1.6 ng/dL (ref 0.9–1.7)

## 2013-03-04 LAB — T3: T3: 44 ng/dL — ABNORMAL LOW (ref 80–200)

## 2013-03-05 ENCOUNTER — Ambulatory Visit
Admit: 2013-03-05 | Discharge: 2013-03-05 | Disposition: A | Discharge status: Home / Self Care | Payer: Self-pay | Source: Ambulatory Visit | Attending: Emergency Medicine | Admitting: Emergency Medicine

## 2013-03-18 ENCOUNTER — Ambulatory Visit
Admit: 2013-03-18 | Discharge: 2013-03-18 | Disposition: A | Discharge status: Home / Self Care | Payer: Self-pay | Source: Ambulatory Visit | Attending: Emergency Medicine | Admitting: Emergency Medicine

## 2013-03-18 LAB — BASIC METABOLIC PANEL
Anion Gap: 12 (ref 7–16)
CO2: 29 mmol/L — ABNORMAL HIGH (ref 20–28)
Calcium: 7.5 mg/dL — ABNORMAL LOW (ref 8.6–10.2)
Chloride: 108 mmol/L (ref 96–108)
Creatinine: 1.24 mg/dL — ABNORMAL HIGH (ref 0.51–0.95)
GFR,Black: 52 * — AB
GFR,Caucasian: 45 * — AB
Lab: 19 mg/dL (ref 6–20)
Potassium: 3.7 mmol/L (ref 3.3–5.1)
Sodium: 149 mmol/L — ABNORMAL HIGH (ref 133–145)

## 2013-03-18 LAB — CBC
Hematocrit: 23 % — ABNORMAL LOW (ref 34–45)
Hemoglobin: 7.2 g/dL — ABNORMAL LOW (ref 11.2–15.7)
MCH: 30 pg/cell (ref 26–32)
MCHC: 32 g/dL (ref 32–36)
MCV: 95 fL (ref 79–95)
Platelets: 147 10*3/uL — ABNORMAL LOW (ref 160–370)
RBC: 2.4 MIL/uL — ABNORMAL LOW (ref 3.9–5.2)
RDW: 18.2 % — ABNORMAL HIGH (ref 11.7–14.4)
WBC: 9.8 10*3/uL (ref 4.0–10.0)

## 2013-03-18 LAB — HEPATIC FUNCTION PANEL
ALT: 21 U/L (ref 0–35)
AST: 125 U/L — ABNORMAL HIGH (ref 0–35)
Albumin: 2.2 g/dL — ABNORMAL LOW (ref 3.5–5.2)
Alk Phos: 186 U/L — ABNORMAL HIGH (ref 35–105)
Bilirubin,Direct: 0.7 mg/dL — ABNORMAL HIGH (ref 0.0–0.3)
Bilirubin,Total: 0.9 mg/dL (ref 0.0–1.2)
Total Protein: 5.3 g/dL — ABNORMAL LOW (ref 6.3–7.7)

## 2013-03-18 LAB — LIPASE: Lipase: 11 U/L — ABNORMAL LOW (ref 13–60)

## 2013-03-18 LAB — GLUCOSE: Glucose: 82 mg/dL (ref 74–106)

## 2013-03-19 LAB — OCCULT BLOOD X 1, STOOL
Card 1 Time: 2000
Occult Blood 1: NEGATIVE

## 2013-03-20 ENCOUNTER — Ambulatory Visit
Admit: 2013-03-20 | Discharge: 2013-03-20 | Disposition: A | Discharge status: Home / Self Care | Payer: Self-pay | Source: Ambulatory Visit | Attending: Emergency Medicine | Admitting: Emergency Medicine

## 2013-03-20 LAB — BASIC METABOLIC PANEL
Anion Gap: 15 (ref 7–16)
CO2: 25 mmol/L (ref 20–28)
Calcium: 7.1 mg/dL — ABNORMAL LOW (ref 8.6–10.2)
Chloride: 113 mmol/L — ABNORMAL HIGH (ref 96–108)
Creatinine: 1.2 mg/dL — ABNORMAL HIGH (ref 0.51–0.95)
GFR,Black: 54 * — AB
GFR,Caucasian: 47 * — AB
Lab: 22 mg/dL — ABNORMAL HIGH (ref 6–20)
Potassium: 3.8 mmol/L (ref 3.3–5.1)
Sodium: 153 mmol/L — ABNORMAL HIGH (ref 133–145)

## 2013-03-20 LAB — CBC AND DIFFERENTIAL
Baso # K/uL: 0 10*3/uL (ref 0.0–0.1)
Basophil %: 0 % (ref 0.1–1.2)
Eos # K/uL: 0.1 10*3/uL (ref 0.0–0.4)
Eosinophil %: 0.5 % — ABNORMAL LOW (ref 0.7–5.8)
Hematocrit: 21 % — ABNORMAL LOW (ref 34–45)
Hemoglobin: 6.5 g/dL — ABNORMAL LOW (ref 11.2–15.7)
Lymph # K/uL: 2.2 10*3/uL (ref 1.2–3.7)
Lymphocyte %: 22.4 % (ref 19.3–51.7)
MCH: 29 pg/cell (ref 26–32)
MCHC: 31 g/dL — ABNORMAL LOW (ref 32–36)
MCV: 94 fL (ref 79–95)
Mono # K/uL: 1 10*3/uL — ABNORMAL HIGH (ref 0.2–0.9)
Monocyte %: 9.9 % (ref 4.7–12.5)
Neut # K/uL: 6.4 10*3/uL — ABNORMAL HIGH (ref 1.6–6.1)
Platelets: 147 10*3/uL — ABNORMAL LOW (ref 160–370)
RBC: 2.2 MIL/uL — ABNORMAL LOW (ref 3.9–5.2)
RDW: 18.2 % — ABNORMAL HIGH (ref 11.7–14.4)
Seg Neut %: 67.2 % (ref 34.0–71.1)
WBC: 9.6 10*3/uL (ref 4.0–10.0)

## 2013-03-20 LAB — GLUCOSE: Glucose: 66 mg/dL — ABNORMAL LOW (ref 74–106)

## 2013-03-23 ENCOUNTER — Telehealth: Payer: Self-pay | Admitting: Internal Medicine

## 2013-03-23 ENCOUNTER — Ambulatory Visit
Admit: 2013-03-23 | Discharge: 2013-03-23 | Disposition: A | Discharge status: Home / Self Care | Payer: Self-pay | Source: Ambulatory Visit | Attending: Emergency Medicine | Admitting: Emergency Medicine

## 2013-03-23 LAB — BASIC METABOLIC PANEL
Anion Gap: 16 (ref 7–16)
CO2: 20 mmol/L (ref 20–28)
Calcium: 7 mg/dL — ABNORMAL LOW (ref 8.6–10.2)
Chloride: 107 mmol/L (ref 96–108)
Creatinine: 0.96 mg/dL — ABNORMAL HIGH (ref 0.51–0.95)
GFR,Black: 70 *
GFR,Caucasian: 61 *
Lab: 17 mg/dL (ref 6–20)
Potassium: 2.8 mmol/L — CL (ref 3.3–5.1)
Sodium: 143 mmol/L (ref 133–145)

## 2013-03-23 LAB — CBC
Hematocrit: 20 % — ABNORMAL LOW (ref 34–45)
Hemoglobin: 6.5 g/dL — ABNORMAL LOW (ref 11.2–15.7)
MCH: 30 pg/cell (ref 26–32)
MCHC: 33 g/dL (ref 32–36)
MCV: 91 fL (ref 79–95)
Platelets: 128 10*3/uL — ABNORMAL LOW (ref 160–370)
RBC: 2.2 MIL/uL — ABNORMAL LOW (ref 3.9–5.2)
RDW: 17.6 % — ABNORMAL HIGH (ref 11.7–14.4)
WBC: 7.8 10*3/uL (ref 4.0–10.0)

## 2013-03-23 LAB — BLOOD BANK HOLD LAVENDER

## 2013-03-23 LAB — BLOOD BANK HOLD RED

## 2013-03-23 LAB — TYPE AND SCREEN
ABO RH Blood Type: A POS
Antibody Screen: NEGATIVE

## 2013-03-23 LAB — GLUCOSE: Glucose: 68 mg/dL — ABNORMAL LOW (ref 74–106)

## 2013-03-23 NOTE — Telephone Encounter (Signed)
Pre treatment call made to Michelle [name] for PRBC (packed red blood cells) scheduled on 03-24-2013. Informed patient to arrive at department at [facility] with patient, the following information was confirmed:      1. Patient's arrival time and date   2. Parking information/location given to patient  3. Directions to department given  4. Patient to bring the following: Insurance card and Photo ID  5. Any further questions, instructed to reach [place] at [phone], Monday thru Friday, 7:30am to 5:00pm.

## 2013-03-24 ENCOUNTER — Encounter: Payer: Self-pay | Admitting: Internal Medicine

## 2013-03-24 ENCOUNTER — Ambulatory Visit
Admit: 2013-03-24 | Disposition: A | Discharge status: Skilled Nursing Facility | Payer: Self-pay | Source: Ambulatory Visit | Attending: Internal Medicine | Admitting: Internal Medicine

## 2013-03-24 LAB — TYPE AND SCREEN
ABO RH Blood Type: A POS
Antibody Screen: NEGATIVE

## 2013-03-24 MED ORDER — SODIUM CHLORIDE 0.9 % IV SOLN WRAPPED *I*
3.0000 mL/h | Status: DC
Start: 2013-03-24 — End: 2013-03-24

## 2013-03-24 NOTE — Progress Notes (Signed)
SOAP Note prior to Transfusion  SUBJECTIVE Section  Patient without complaints.  History of chronic anemia requiring transfusion.  Type and Screen done yesterday, but unfortunately went to [facility] lab.      OBJECTIVE Section:    Vitals: Blood pressure 128/42, pulse 75, temperature 36.6 C (97.9 F), temperature source Temporal, resp. rate 16, SpO2 98 %.   Vital-Signs Ranges: Temp:  [36.6 C (97.9 F)] 36.6 C (97.9 F)  Heart Rate:  [75] 75  Resp:  [16] 16  BP: (128)/(42) 128/42 mmHg       Lab Results: HCT from 03/23/13 was 20      Physical Examination:  Physical Exam   Constitutional: No distress.   HENT:   Head: Normocephalic and atraumatic.   Eyes: Conjunctivae are normal. Right eye exhibits no discharge. Left eye exhibits no discharge. No scleral icterus.   Neck: Neck supple.   Cardiovascular: Normal rate, regular rhythm and normal heart sounds.    Pulmonary/Chest: Effort normal and breath sounds normal.   Abdominal: Soft. Bowel sounds are normal. There is no tenderness.   Skin: Skin is warm and dry.       ASSESSMENT   Symptomatic anemia.    PLAN SECTION  Transfuse 2 unite PRBCs as ordered.  MAR from facility reviewed.  UTD with medication administration.  Diet per MAR.    Author: Sherral Hammers, PA  as of: 03/24/2013  at: 9:10 AM

## 2013-03-24 NOTE — Discharge Instructions (Signed)
BLOOD TRANSFUSION DISCHARGE INSTRUCTIONS  Return to usual activity.   You may remove the band-aid in 4 to 6 hours.      You Should Call Your Doctor For Any Of The Following:     Pain or redness at the IV site.   Chills or fever.   Hives or rash.   Trouble breathing.   Any unusual symptoms.    If unable to reach your doctor, go to the [facility] Emergency Department.      Follow up care:                I have read and understand the above instructions.      Patient signature ______________________        Date 03/24/2013      Nurse's signature______________________

## 2013-03-25 ENCOUNTER — Other Ambulatory Visit: Payer: Self-pay | Admitting: Gastroenterology

## 2013-03-25 ENCOUNTER — Ambulatory Visit
Admit: 2013-03-25 | Discharge: 2013-03-25 | Disposition: A | Discharge status: Home / Self Care | Payer: Self-pay | Source: Ambulatory Visit | Attending: Emergency Medicine | Admitting: Emergency Medicine

## 2013-03-25 ENCOUNTER — Observation Stay
Admit: 2013-03-25 | Disposition: A | Discharge status: Skilled Nursing Facility | Payer: Self-pay | Source: Skilled Nursing Facility | Attending: Internal Medicine | Admitting: Internal Medicine

## 2013-03-25 ENCOUNTER — Encounter: Payer: Self-pay | Admitting: Emergency Medicine

## 2013-03-25 LAB — CBC
Hematocrit: 34 % (ref 34–45)
Hemoglobin: 11.1 g/dL — ABNORMAL LOW (ref 11.2–15.7)
MCH: 29 pg/cell (ref 26–32)
MCHC: 33 g/dL (ref 32–36)
MCV: 89 fL (ref 79–95)
Platelets: 111 10*3/uL — ABNORMAL LOW (ref 160–370)
RBC: 3.8 MIL/uL — ABNORMAL LOW (ref 3.9–5.2)
RDW: 17.4 % — ABNORMAL HIGH (ref 11.7–14.4)
WBC: 9.4 10*3/uL (ref 4.0–10.0)

## 2013-03-25 LAB — COMPREHENSIVE METABOLIC PANEL
ALT: 24 U/L (ref 0–35)
AST: 220 U/L — ABNORMAL HIGH (ref 0–35)
Albumin: 2.1 g/dL — ABNORMAL LOW (ref 3.5–5.2)
Alk Phos: 195 U/L — ABNORMAL HIGH (ref 35–105)
Anion Gap: 16 (ref 7–16)
Bilirubin,Total: 2 mg/dL — ABNORMAL HIGH (ref 0.0–1.2)
CO2: 19 mmol/L — ABNORMAL LOW (ref 20–28)
Calcium: 7.6 mg/dL — ABNORMAL LOW (ref 8.6–10.2)
Chloride: 113 mmol/L — ABNORMAL HIGH (ref 96–108)
Creatinine: 1.02 mg/dL — ABNORMAL HIGH (ref 0.51–0.95)
GFR,Black: 65 *
GFR,Caucasian: 57 * — AB
Lab: 17 mg/dL (ref 6–20)
Potassium: 3.9 mmol/L (ref 3.3–5.1)
Sodium: 148 mmol/L — ABNORMAL HIGH (ref 133–145)
Total Protein: 4.9 g/dL — ABNORMAL LOW (ref 6.3–7.7)

## 2013-03-25 LAB — CBC AND DIFFERENTIAL
Baso # K/uL: 0 10*3/uL (ref 0.0–0.1)
Basophil %: 0 % (ref 0.1–1.2)
Eos # K/uL: 0 10*3/uL (ref 0.0–0.4)
Eosinophil %: 0.1 % — ABNORMAL LOW (ref 0.7–5.8)
Hematocrit: 30 % — ABNORMAL LOW (ref 34–45)
Hemoglobin: 10.3 g/dL — ABNORMAL LOW (ref 11.2–15.7)
Lymph # K/uL: 1.6 10*3/uL (ref 1.2–3.7)
Lymphocyte %: 18.1 % — ABNORMAL LOW (ref 19.3–51.7)
MCH: 29 pg (ref 26–32)
MCHC: 34 g/dL (ref 32–36)
MCV: 87 fL (ref 79–95)
Mono # K/uL: 0.7 10*3/uL (ref 0.2–0.9)
Monocyte %: 7.3 % (ref 4.7–12.5)
Neut # K/uL: 6.6 10*3/uL — ABNORMAL HIGH (ref 1.6–6.1)
Platelets: 92 10*3/uL — ABNORMAL LOW (ref 160–370)
RBC: 3.5 MIL/uL — ABNORMAL LOW (ref 3.9–5.2)
RDW: 17.3 % — ABNORMAL HIGH (ref 11.7–14.4)
Seg Neut %: 73.4 % — ABNORMAL HIGH (ref 34.0–71.1)
WBC: 9.1 10*3/uL (ref 4.0–10.0)

## 2013-03-25 LAB — BLOOD GAS, ARTERIAL
Base Excess, Arterial: -2
CO2,ART (Calc): 23 mmol/L (ref 21–28)
CO: 0.4 % (ref 0.0–1.4)
FO2 Hb, Arterial: 92 % (ref 90–95)
HCO3, Arterial: 22 mmol/L (ref 22–30)
Hemoglobin: 10.8 g/dL — ABNORMAL LOW (ref 11.2–15.7)
Methemoglobin: 0.3 % (ref 0.0–1.0)
pCO2, Arterial: 34 mm Hg — ABNORMAL LOW (ref 35–45)
pH: 7.43 (ref 7.36–7.44)
pO2,Arterial: 64 mm Hg — ABNORMAL LOW (ref 80–100)

## 2013-03-25 LAB — PROTIME-INR

## 2013-03-25 LAB — RED BLOOD CELLS
Red Blood Cells: TRANSFUSED
Red Blood Cells: TRANSFUSED

## 2013-03-25 LAB — APTT

## 2013-03-25 LAB — GLUCOSE: Glucose: 93 mg/dL (ref 74–106)

## 2013-03-25 LAB — TYPE AND SCREEN

## 2013-03-25 MED ORDER — SODIUM CHLORIDE 0.9 % IV BOLUS *I*
1000.0000 mL | Freq: Once | Status: AC
Start: 2013-03-25 — End: 2013-03-26
  Administered 2013-03-26: 1000 mL via INTRAVENOUS

## 2013-03-25 NOTE — ED Notes (Signed)
Pt sent from [facility] for evaluation of lethargy, weakness and altered mental status. Per staff at [facility] pt has exacerbated residual effects of previous CVA. Pt is alert to her name.

## 2013-03-25 NOTE — ED Notes (Signed)
Bed: PA-02  Expected date: 03/25/13  Expected time:   Means of arrival:   Comments:  Michelle [name] [birth year] Full Code coming from [facility] for evaluation of elevated LFT's, lethargy and poor intake. Pt has been getting on/off IVF, received blood transfusion yesterday for HCT 20. HCT now 34. Please call [facility] at [phone] with questions

## 2013-03-26 DIAGNOSIS — R4182 Altered mental status, unspecified: Secondary | ICD-10-CM | POA: Diagnosis present

## 2013-03-26 LAB — DRUGS OF ABUSE SCRN,URINE
Amphetamine,UR: NEGATIVE
Barbiturate,UR: NEGATIVE
Benzodiazepinen,UR: NEGATIVE
Cocaine/Metab,UR: NEGATIVE
Opiates,UR: NEGATIVE
PCP,UR: NEGATIVE
THC Metabolite,UR: NEGATIVE
Tricyclics,UR: NEGATIVE

## 2013-03-26 LAB — HAPTOGLOBIN: Haptoglobin: 138 mg/dL (ref 30–200)

## 2013-03-26 LAB — URINALYSIS REFLEX TO CULTURE
Blood,UA: NEGATIVE
Nitrite,UA: NEGATIVE
Protein,UA: 25 mg/dL — AB
Specific Gravity,UA: 1.016 (ref 1.001–1.030)
pH,UA: 5 (ref 5.0–8.0)

## 2013-03-26 LAB — LACTATE DEHYDROGENASE: LD: 419 U/L — ABNORMAL HIGH (ref 118–225)

## 2013-03-26 LAB — NT-PRO BNP: NT-pro BNP: 38123 pg/mL — ABNORMAL HIGH (ref 0–900)

## 2013-03-26 LAB — URINE MICROSCOPIC (IQ200)

## 2013-03-26 LAB — BASIC METABOLIC PANEL
Anion Gap: 13 (ref 7–16)
CO2: 20 mmol/L (ref 20–28)
Calcium: 7.8 mg/dL — ABNORMAL LOW (ref 8.6–10.2)
Chloride: 113 mmol/L — ABNORMAL HIGH (ref 96–108)
Creatinine: 0.96 mg/dL — ABNORMAL HIGH (ref 0.51–0.95)
GFR,Black: 70 *
GFR,Caucasian: 61 *
Glucose: 81 mg/dL (ref 60–99)
Lab: 16 mg/dL (ref 6–20)
Potassium: 3.6 mmol/L (ref 3.3–5.1)
Sodium: 146 mmol/L — ABNORMAL HIGH (ref 133–145)

## 2013-03-26 LAB — RUQ PANEL (ED ONLY)
ALT: 35 U/L (ref 0–35)
AST: 201 U/L — ABNORMAL HIGH (ref 0–35)
Albumin: 1.9 g/dL — ABNORMAL LOW (ref 3.5–5.2)
Alk Phos: 183 U/L — ABNORMAL HIGH (ref 35–105)
Amylase: 31 U/L (ref 28–100)
Bili,Indirect: 0.7 mg/dL
Bilirubin,Direct: 1.4 mg/dL — ABNORMAL HIGH (ref 0.0–0.3)
Bilirubin,Total: 2.1 mg/dL — ABNORMAL HIGH (ref 0.0–1.2)
Globulin: 2.8 g/dL (ref 2.7–4.3)
Lipase: 17 U/L (ref 13–60)
Total Protein: 4.7 g/dL — ABNORMAL LOW (ref 6.3–7.7)

## 2013-03-26 LAB — HOLD GREEN WITH GEL

## 2013-03-26 LAB — HOLD LAVENDER

## 2013-03-26 LAB — CK ISOENZYMES
CK: 60 U/L (ref 34–145)
Mass CKMB: 3.5 ng/mL — ABNORMAL HIGH (ref 0.0–2.9)

## 2013-03-26 LAB — HOLD SST

## 2013-03-26 LAB — POCT GLUCOSE
Glucose POCT: 68 mg/dL (ref 60–99)
Glucose POCT: 72 mg/dL (ref 60–99)

## 2013-03-26 LAB — HOLD BLUE

## 2013-03-26 LAB — TSH: TSH: 0.32 u[IU]/mL (ref 0.27–4.20)

## 2013-03-26 LAB — TROPONIN T: Troponin T: 0.04 ng/mL — ABNORMAL HIGH (ref 0.00–0.02)

## 2013-03-26 MED ORDER — ATORVASTATIN CALCIUM 10 MG PO TABS *I*
10.0000 mg | ORAL_TABLET | Freq: Every day | ORAL | Status: DC
Start: 2013-03-26 — End: 2013-03-26
  Filled 2013-03-26: qty 1

## 2013-03-26 MED ORDER — LACTATED RINGERS IV SOLN *I*
50.0000 mL/h | INTRAVENOUS | Status: DC
Start: 2013-03-26 — End: 2013-03-26
  Administered 2013-03-26: 50 mL/h via INTRAVENOUS

## 2013-03-26 MED ORDER — FAMOTIDINE 20 MG PO TABS *I*
20.0000 mg | ORAL_TABLET | Freq: Two times a day (BID) | ORAL | Status: DC
Start: 2013-03-26 — End: 2013-03-26
  Administered 2013-03-26: 20 mg via ORAL
  Filled 2013-03-26: qty 1

## 2013-03-26 MED ORDER — ASPIRIN 81 MG PO TBEC *I*
81.0000 mg | DELAYED_RELEASE_TABLET | Freq: Every day | ORAL | Status: DC
Start: 2013-03-26 — End: 2013-03-26
  Administered 2013-03-26: 81 mg via ORAL
  Filled 2013-03-26: qty 1

## 2013-03-26 MED ORDER — IPRATROPIUM-ALBUTEROL 0.5-2.5 MG/3ML IN SOLN *I*
3.0000 mL | Freq: Four times a day (QID) | RESPIRATORY_TRACT | Status: DC | PRN
Start: 2013-03-26 — End: 2013-03-26

## 2013-03-26 MED ORDER — SODIUM CHLORIDE 0.9 % INJ (FLUSH) WRAPPED *I*
3.0000 mL | Freq: Three times a day (TID) | Status: DC
Start: 2013-03-26 — End: 2013-03-26
  Administered 2013-03-26: 3 mL via INTRAVENOUS

## 2013-03-26 MED ORDER — ENOXAPARIN SODIUM 40 MG/0.4ML IJ SOSY *I*
40.0000 mg | PREFILLED_SYRINGE | Freq: Every day | INTRAMUSCULAR | Status: DC
Start: 2013-03-26 — End: 2013-03-26

## 2013-03-26 MED ORDER — AMIODARONE HCL 200 MG PO TABS *I*
400.0000 mg | ORAL_TABLET | Freq: Every day | ORAL | Status: DC
Start: 2013-03-26 — End: 2013-03-26
  Administered 2013-03-26: 400 mg via ORAL
  Filled 2013-03-26: qty 2

## 2013-03-26 MED ORDER — CARBIDOPA-LEVODOPA 25-100 MG PO TABS *I*
1.0000 | ORAL_TABLET | Freq: Two times a day (BID) | ORAL | Status: DC
Start: 2013-03-26 — End: 2013-03-26
  Administered 2013-03-26: 1 via ORAL
  Filled 2013-03-26 (×3): qty 1

## 2013-03-26 MED ORDER — BISACODYL 10 MG RE SUPP *I*
10.0000 mg | Freq: Every day | RECTAL | Status: DC | PRN
Start: 2013-03-26 — End: 2013-03-26

## 2013-03-26 NOTE — Discharge Summary (Signed)
Name: Nariyah Osias MRN: [id number] DOB: Jan 28, 1946     Admit Date: 03/25/2013   Date of Discharge: 03/26/2013    Discharge Attending Physician: Para Skeans      Hospitalization Summary    CONCISE NARRATIVE: Ms. Robidoux was admitted with chronic decline.  She did not have complaints of abdominal pain, nausea, or pain anywhere.  Her testing showed stable head CT, no symptoms of infection.  Her LFTs were noted to be slightly elevated, however without symptoms, surgery for any reason would be unreasonable given her elevated operative risk with extensive vascular disease.  She appears to have a subacute decline with poor PO, increased fatigue, and occasional constipation.  At this point, we have discussed with family (HCP son could not be reached) that she appears to have a slow decline and that a goals of care discussion is certainly warranted.  No medication changes were made.       CT RESULTS: CT head without acute changes.  XRAY RESULTS: Poor penetration, no focal infiltrates or fluid overload.   OTHER PENDING TEST RESULTS: Abdominal Ultrasound pending.   SIGNIFICANT MED CHANGES: None      Signed: Arlina Robes, MD  On: 03/26/2013  at: 12:28 PM

## 2013-03-26 NOTE — H&P (Addendum)
Critical Care H&P/Consult    HPI: Michelle [name] is a 16XWR with a complicated past medical history including COPD, CAD, MI, VT s/p ICD, HTN, CVA, CHF, PVD s/p left AKA, Parkinsonism, and anemia of chronic disease who presents from SNF with AMS. History is provided by chart as patient is lethargic and disoriented, no family members present.     Patient had prolonged admission at [facility] from 11/25-12/12 for SBO failing conservative management and requiring ex-lap with extensive lysis of adhesions. Since then, the patient has reportedly not returned to baseline functionally, though she has been alert and oriented. She has had progressive decline over the past several months with failure to thrive.  Yesterday she was noted to be anemic with a hematocrit of 20 (baseline 24-27), for which she was transfused 2 units PRBC. Staff at the nursing home report that she has been confused and lethargic since returning from the transfusion. No documented fevers, recent illness.     Work-up in the ED has essentially been unremarkable. CT head prelim negative for any acute abnormalities. She was pan-cultured despite no leukocytosis, fever, no SIRS criteria, UA is benign, CXR without new infiltrate. Urine tox screen is negative. LFTs and direct/Tbili elevated, RUQ US showed known cholelithiasis without evidence of acute cholecystitis. Her BNP is markedly elevated at 38,123 and CXR does show mild pulmonary edema, however not significantly worse than prior films. She is not grossly fluid overloaded on exam. She is mildly hypoxic on ABG with a pO2 64 (drawn on room air).       Past Medical History   Diagnosis Date    CVA (cerebral infarction) 10/23/2011    ASHD (arteriosclerotic heart disease) 10/23/2011    MI (mitral incompetence) 10/23/2011    CHF (congestive heart failure) 10/23/2011     EF 30%    COPD (chronic obstructive pulmonary disease) 10/23/2011    VT (ventricular tachycardia) 10/23/2011     S/p AICD    Anemia of chronic disease  10/23/2011    HTN (hypertension) 10/23/2011    Parkinsonism     Coronary artery disease     AICD (automatic cardioverter/defibrillator) present     Myocardial infarction june 2013    Alcohol abuse, in remission     Peripheral vascular disease      Left AKA    Fibroadenoma      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 No pertinent family history.       Review of Systems:  Review of systems not obtained due to patient factors.    Scheduled Meds:   sodium chloride  3 mL Intravenous Q8H     Continuous Infusions:   PRN Meds:    Objective:    Vital signs in last 24 hours:  Temp:  [36.1 C (97 F)] 36.1 C (97 F)  Heart Rate:  [60] 60  Resp:  [16] 16  BP: (107)/(48) 107/48 mmHg    General appearance: no distress, uncooperative and lethargic  Head: Normocephalic, without obvious abnormality, atraumatic  Lungs: CTAB  Heart: regular rate and rhythm, S1, S2 normal, no murmur, click, rub or gallop  Abdomen: soft, non-tender; bowel sounds normal; no masses,  no organomegaly  Extremities: RLE with trace pedal edema, no cyanosis. Left lower extremity AKA  Pulses: 2+ and symmetric  Skin: Warm and dry. No rash.   Neurologic: Lethargic, but awakens to voice. Disoriented, unable to follow commands. MAE.     Labs:      Recent Labs  Lab 03/25/13  2312 03/25/13  2311 03/25/13  1041 03/23/13  0708 03/20/13  0745   WBC  --  9.1 9.4 7.8 9.6   Hemoglobin 10.8* 10.3* 11.1* 6.5* 6.5*   Hematocrit  --  30* 34 20* 21*    Platelets  --  92* 111* 128* 147*   Seg Neut %  --  73.4*  --   --  67.2   Lymphocyte %  --  18.1*  --   --  22.4   Monocyte %  --  7.3  --   --  9.9   Eosinophil %  --  0.1*  --   --  0.5*         Recent Labs  Lab 03/25/13  2311 03/25/13  1041 03/23/13  0708   Sodium 146* 148* 143   Potassium 3.6 3.9 2.8*   CO2 20 19* 20   UN $R'16 17 17   'Nj$ Creatinine 0.96* 1.02* 0.96*   Glucose 81  --   --    Calcium 7.8* 7.6* 7.0*         Recent Labs  Lab 03/25/13  2311   INR CANCELED   aPTT CANCELED   Protime CANCELED         Recent Labs  Lab 03/25/13  2311 03/25/13  1041   Alk Phos 183* 195*   Bilirubin,Total 2.1* 2.0*   Bilirubin,Direct 1.4*  --    Albumin 1.9* 2.1*   ALT 35 24   AST 201* 220*   Total Protein 4.7* 4.9*       Micro:  2/11 Urine culture in process; UA benign   2/11 Blood cultures x2    Radiology:  2/11 CXR (my read): increased interstitial markings bilaterally with cephalization, suggestive of pulmonary edema. Persistent LLL opacification.      Assessment: 61PJK with a complicated past medical history admitted with AMS of unclear etiology in the setting of recent blood transfusion and several months of progressive decline with failure to thrive. CT head negative for any acute intracranial abnormalities. She does not meet any SIRS criteria and there is no leukocytosis making infection less likely.     Active Hospital Problems    Diagnosis    Altered mental status         Plan:    AMS of unclear etiology in the setting of progressive functional decline:    - Hypoxia may be contributing, though it is mild. Supplemental O2 via NC prn  - CT head prelim negative for acute intracranial abnormalities. She does have small vessel ischemic changes and at risk for lacunar infarcts. Consider MRI if no other cause for altered mental status becomes apparent.   - Urine tox screen negative  - No significant electrolyte abnormalities,  mild hypernatremia of 146.   - TSH normal, BG 81   - Avoid sedating medications     COPD/Hypoxia:    - Mild pulmonary edema in the setting of blood transfusion with EF 20-25%.   - Oxygen saturation 100% on room air, pO2 64 on ABG  - Home Duonebs prn, supplemental oxygen prn     CAD, MI, VT s/p ICD, HTN, CVA, CHF:  - Currently hemodynamically stable  - Continue trending cardiac enzymes   - Hold home Coreg, continue Amiodarone for rate control, ASA, statin     Parkinsonism:  - Continue Sinemet     Anemia of chronic disease/Thrombocytpenia: Baseline hematocrit of 24-27   - Received 2units PRBC for HCT 20, repeat 34-->30, which makes me question if initial hematocrit was accurate.   - Thrombocytopenia -?dilutional secondary to PRBC transfusion, IVF.     F/E/N:   - NPO until mental status improved; gentle IVF  - May need to place DHT for nutrition, medications.   - Home Pepcid.    Volume goal for next 24hrs:    DVT ppx: Lovenox       Denis Carreon A Kosel, PA  3:19 AM

## 2013-03-26 NOTE — ED Notes (Signed)
Pt has bilateral  Upper extremity lymph edema, Dr Lurline Hare made aware of the inability to draw  Am labs. Previous labs were obtained via an arterial stick. Left BKA Rt LE contracted. Head tilts cont to left side.

## 2013-03-26 NOTE — Discharge Instructions (Signed)
Brief Summary of Your Hospital Course (including key procedures and diagnostic test results):  You were admitted to [facility] for chronically worsening confusion, poor oral intake which has been happening since your surgery a few months ago.  At this time, your laboratory work looks stable and there are no findings of infection at this time.  We are discharging you back to the [facility] where they will continue to monitor you closely.   Your instructions:  What to do after you leave the hospital:    Recommended diet: regular diet    Recommended activity: activity as tolerated    If you experience any of these symptoms within the first 24 hours after discharge:Uncontrolled pain, Shortness of breath or Fever of 101 F. or greater  please follow up with your discharge attending, Dr Para Skeans who can be reached during business hours at [phone].   Between 4 pm and 8 am or on the weekend, you may call operator at [phone] and ask for the hospitalist on-call.    If you experience any of these symptoms 24 hours or more after discharge, please follow up with your PCP:  Cordelia Poche, MD [phone]

## 2013-03-26 NOTE — ED Notes (Signed)
Report given to Tiffany RN 

## 2013-03-26 NOTE — Comprehensive Assessment (Signed)
03/26/13 1021   Demographics   Religious/Cultural Factors [facility] )   [place]   Marital Status Widowed   Ethnicity/Race African American   Risk Factors   Risk Factors Age related issues;Adjustment to Dx/Injury/Illness;Current or planned institutional Placement  (long-term care at [facility] )   Contacts/Support Systems   Contacts/Support Systems Residential Staff   Agency [facility]    Number [phone]    Relationship long-term care staff   Comments pt has lived there since June of 2013    [place]   Spokesperson Name Patience Musca    Relationship to Patient  Son   Phone Number(s) [phone]   Alternate Spokesperson? Yes   Spokesperson Alternate Dian Situ    Relationship to Patient  Other relative  (cousin)   Phone Number(s) [phone]   Emergency Contact other than spokesperson? No   Ride to/from Mudlogger Situation   Lives With SNF   Comments [facility]    Home Geography   Type of Home Facility (SNF)   # Of Steps In Home 0   # Steps to Enter Home 0   One Story or Two One story   Bedroom First floor   Bathroom First floor   Utilitites Working Yes   Activities of Daily Living   Transfers (2 assist)   Risk analyst   Ambulation With assistance  (wc with assist)   Bathing/Grooming With assistance   Nutrition With assistance  (reg diet at baseline, "full nutritional liquids" currently. setup )   Household Management With assistance  (SNF )   Income Information   Vocational Retired   Engineer, civil (consulting) Used [employer] through [employer] not a [employer] obtained from medical record.  Trudee Kuster, LMSW  03/26/2013  10:24 AM

## 2013-03-26 NOTE — ED Provider Notes (Signed)
History     Chief Complaint   Patient presents with    Fatigue     Pt sent from [facility] for evaluation of lethargy, altered mental status and weakness. Pt had blood transfusion yesterday for HCT of 20. Pt has been altered since return from transfusion    Altered Mental Status     HPI Comments: CC: altered mental status  HPI: 68yo female who has never returned to baseline since her SBO in November but as of two days ago was able to answer questions and was A+Ox3. She has been losing weight rapidly. She was sent out for a blood transfusion and returned lethargic and confused. She has not had fevers. She has had blood work and ultrasound done- no results. She was getting more confused so family asked to have her sent to ED. She had 4 days of clysis in her arms.      History provided by:  EMS personnel, nursing home and medical records  History limited by:  Mental status change and patient unresponsive  Language interpreter used: No    Is this ED visit related to civilian activity for income:  Not work related      Past Medical History   Diagnosis Date    CVA (cerebral infarction) 10/23/2011    ASHD (arteriosclerotic heart disease) 10/23/2011    MI (mitral incompetence) 10/23/2011    CHF (congestive heart failure) 10/23/2011     EF 30%    COPD (chronic obstructive pulmonary disease) 10/23/2011    VT (ventricular tachycardia) 10/23/2011     S/p AICD    Anemia of chronic disease 10/23/2011    HTN (hypertension) 10/23/2011    Parkinsonism     Coronary artery disease     AICD (automatic cardioverter/defibrillator) present     Myocardial infarction june 2013    Alcohol abuse, in remission     Peripheral vascular disease      Left AKA    Fibroadenoma      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 No pertinent family history.      Social History      reports that she does not drink alcohol. Her tobacco, drug, and sexual activity histories are not on file.    Living Situation    Questions Responses    Patient lives with SNF    Homeless     Caregiver for other family member     External Services     Employment     Domestic Violence Risk           Problem List     Patient Active Problem List   Diagnosis Code    AKI (acute kidney injury) 584.9    Unspecified cerebral artery occlusion with cerebral infarction 434.91    Coronary artery disease 414.00    AICD (automatic cardioverter/defibrillator) present V45.02    Hypertension 401.9    CVA (cerebral infarction) 434.91    ASHD (arteriosclerotic heart disease) 414.00    MI (mitral incompetence) 424.0    CHF (congestive heart failure)  428.0    COPD (chronic obstructive pulmonary disease) 496    ETOH abuse 305.00    VT (ventricular tachycardia) 427.1    Anemia of chronic disease 285.29    Parkinsonism 332.0    C. 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    Peripheral vascular disease 443.9    SBO (small bowel obstruction) 560.9    Hypotension 458.9    Acute kidney injury 584.9    Dehydration 276.51    S/P dx laparoscopy, LOA 01/16/13 V45.89    Fibroadenoma 217    Altered mental status 780.97       Review of Systems   Review of Systems   Unable to perform ROS      Physical Exam     ED Triage Vitals   BP Heart Rate Heart Rate(via Pulse Ox) Resp Temp Temp Source SpO2 O2 Device O2 Flow Rate   03/25/13 2212 03/25/13 2212 -- 03/25/13 2212 03/25/13 2212 03/25/13 2212 03/25/13 2212 03/25/13 2212 --   107/48 mmHg 60  16 36.1 C (97 F) TEMPORAL 100 % None (Room air)       Weight           03/25/13 2212           52.164 kg (115 lb)                Physical Exam   Constitutional: She appears well-developed and well-nourished. She appears lethargic.   HENT:   Head: Normocephalic and atraumatic.   Nose: Nose normal.   Mouth/Throat: Oropharynx is clear and moist.   Eyes: Conjunctivae and EOM are normal. Pupils are equal, round, and reactive to light.   Neck: Normal range of motion. Neck supple.   Cardiovascular: Normal rate, regular rhythm, normal heart sounds and intact distal pulses.    Pulmonary/Chest: Effort normal and breath sounds normal. No stridor. She has no wheezes. She has no rales.   Abdominal: Soft. Bowel sounds are normal. She exhibits no distension. There is no tenderness. There is no rebound and no guarding.   Musculoskeletal: Normal range of motion. She exhibits edema (arms with pitting edema. b/l akas.).   Neurological: She appears lethargic.   Left facial droop- drooling   Skin: Skin is dry.   Nursing note and vitals reviewed.      Medical Decision Making   <EDMDM>    Initial Evaluation:  ED First Provider Contact    Date/Time Event User Comments    03/25/13 2221 ED Provider First Contact Amorina Doerr S Initial Face to Face Provider Contact          Patient seen by me on arrival date of 03/25/2013 at at time of arrival  10:05 PM.  Initial face to face evaluation time noted above may be discrepant due to patient acuity and delay in documentation.    Assessment:  68 y.o., female comes to the ED with lethargy, ams    Differential Diagnosis includes electrolyte abnormality, infection, stroke              Plan: cbc, lytes, ruq, tsh, trop, cks, ua, ucx, blood cultures, head ct, chest xray, ultrasound.  Obs to geriatrics.  No sign of infection- hold antibiotics  U/S shows no cause of bilirubin up- hemolysis?  No signs of fluids overload with  high bnp- hold lasix with soft pressures, poor intake but will also not give fluids with normal urine spec grav  CT unremarkable.  Pt seems to be failing to thrive. Goals of care need to be readdressed  however son not reachable and patient unable to participate in decision making.      Micki Riley, MD          Micki Riley, MD  03/26/13 [phone]

## 2013-03-26 NOTE — Comprehensive Assessment (Signed)
03/25/13 0000   Discharge Planning   Lives With SNF  ([facility] )   Can they assist with pt needs after discharge? (TBD--no bed hold but will try to take back)   *Does patient currently have home care services? No   *Current External Services None   Current Home Equipment Wheelchair-manual   Expected Discharge Date (TBD )   Follow for: Discharge Planning   SW Plan SW to follow (see discharge plan)   Interventions   Interventions SNF Referral initiated  ([facility])   Information obtained from medical record. Pt resides at the [facility] for long-term care and has lived there since June of 2013. She uses a wheelchair with assist at baseline. SW spoke with Maudie Mercury in admissions at [facility] [phone]) who reports that pt has used all of her bed hold days but that they will try to take pt back upon d/c if able. SW faxed referral to the placement office to open [facility] and will continue to follow.  Trudee Kuster, LMSW  03/26/2013  10:26 AM

## 2013-03-26 NOTE — H&P (Addendum)
 GERIATRIC MEDICINE HISTORY AND PHYSICAL:     CC: "What?"    History of Present Illness: 37F resident of the [facility] sent in for increasing AMS worsening over months since a hospital admission for SBO.      Patient is surprised that she's in a hospital.  Denies pain or nausea or abdominal pain.  Speaks very little given dysphasia s/p CVA.  Denies that she has any family.     Per Leonie Green RN at [facility], she is usually alert, oriented x 2 or 3, could feed herself, but last few weeks she needed assist with feeds, demonstrating slow decline.  After she lost her leg, her weights have been dropping off.  Her family came in and felt it was of concern and wanted her sent in although RN staff was comfortable given stable vitals and labs being monitored. She has had some emesis lately, requires some fluids.  Her HCT dropped to 20 and she got 2u PRBCs, but she didn't bounce back. Per staff, family doesn't visit often and can be difficult to reach at times. Her son is her HCP.      Her sister later appeared at bedside, noted a slow decline since her multiple surgeries.  Her mental status waxes and wanes.  She acknowledges that she is slowly dying, was surprised to find her doing well in the hospital.  Her niece was reached by phone and echoed similar sentiments.  There is a family meeting planned for this week.     Review of Systems: Unable to assess given pt decreased engagement.     Past Medical History   Diagnosis Date   . CVA (cerebral infarction) 10/23/2011   . ASHD (arteriosclerotic heart disease) 10/23/2011   . MI (mitral incompetence) 10/23/2011   . CHF (congestive heart failure) 10/23/2011     EF 30%   . COPD (chronic obstructive pulmonary disease) 10/23/2011   . VT (ventricular tachycardia) 10/23/2011     S/p AICD   . Anemia of chronic disease 10/23/2011   . HTN (hypertension) 10/23/2011   . Parkinsonism    . Coronary artery disease    . AICD (automatic cardioverter/defibrillator) present    . Myocardial infarction june  2013   . Alcohol abuse, in remission    . Peripheral vascular disease      Left AKA   . 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 Icd     . Hemicolectomy Left    . Cardiac defibrillator placement     . Pacemaker insertion     . Hh picc placement consult hh only  12/10/2012           History reviewed. No pertinent family history.    Social: Widowed, has a son, mother in law and sister who are intermittently involved in care.  Unknown smoking status. No ETOH.     Allergies: No Known Allergies (drug, envir, food or latex)     Admission Medications:   No current facility-administered medications on file prior to encounter.     Current Outpatient Prescriptions on File Prior to Encounter   Medication Sig Dispense Refill   . lactobacillus acidophilus & bulgar (LACTINEX) chewable tablet Take 1 tablet by mouth 3 times daily (with meals)  50 tablet  0   . acetaminophen (TYLENOL) 500 mg tablet Take 2 tablets (1,000 mg total)  by mouth 3 times daily  30 tablet     . carvedilol (COREG) 6.25 MG tablet Take 0.5 tablets (3.125 mg total) by mouth daily   Hold for sbp < 110       . cyanocobalamin (VITAMIN B-12) 1000 MCG tablet Take 1,000 mcg by mouth daily       . aspirin 81 MG EC tablet Take 81 mg by mouth daily       . carbidopa-levodopa (SINEMET) 25-100 MG per tablet Take 1 tablet by mouth 2 times daily       . famotidine (PEPCID) 20 MG tablet Take 20 mg by mouth 2 times daily       . Cholecalciferol (VITAMIN D3) 50000 UNITS CAPS Take 50,000 Units by mouth every 28 days       . amiodarone (PACERONE) 400 MG tablet Take 400 mg by mouth daily       . sertraline (ZOLOFT) 50 MG tablet Take 50 mg by mouth daily       . atorvastatin (LIPITOR) 10 MG tablet Take 10 mg by mouth daily (with dinner)       . HYDROcodone-acetaminophen (NORCO) 5-325 MG per tablet Take 1 tablet by mouth every 6 hours as needed for Pain           .  guaiFENesin (ROBITUSSIN) 100 MG/5ML syrup Take 200 mg by mouth 3 times daily as needed for Congestion       . sorbitol 70 % solution Take 30 mLs by mouth daily as needed for Constipation       . ipratropium-albuterol (DUONEB) 0.5-2.5 (3) MG/3ML nebulizer solution Take 3 mLs by nebulization 4 times daily as needed for Wheezing       . calcium carbonate-vitamin D 600-400 MG-UNIT per tablet Take 1 tablet by mouth 2 times daily       . bisacodyl (DULCOLAX) 10 MG suppository Place 10 mg rectally daily as needed         Physical Examination:  BP 111/50   Pulse 65   Temp(Src) 36.8 C (98.2 F) (Temporal)   Resp 18   Ht 1.575 m (5\' 2" )   Wt 52.164 kg (115 lb)   BMI 21.03 kg/m2     SpO2 100%    Gen: NAD sleeping soundly with clear upper airway obstruction, wakes with aggressive stim, quickly doses back to sleep. Not oriented.  Slumped to the left.   HEENT: anicteric, MMM, upper airway obstruction with weak facial muscles,   Pulm: Normal respiratory rate. CTA(B), no wheezes, crackles, rhonchi  CV: RRR no M/R/G. No carotid bruits. 2+ radial and DP pulses.  Abd: soft, mild tenderness over right abdomen but no guarding, ND, +BS  Skin: no rashes  Ext: no + pitting edema in upper extremitites,   NEURO: Bilateral upper extremity weakness (hx CVA). LLE AKA.  RLE flexed up.     Labs:    Recent Labs  Lab 03/25/13  2311 03/25/13  1041 03/23/13  0708   Sodium 146* 148* 143   Potassium 3.6 3.9 2.8*   Chloride 113* 113* 107   CO2 20 19* 20   UN 16 17 17    Creatinine 0.96* 1.02* 0.96*   Calcium 7.8* 7.6* 7.0*   Albumin 1.9* 2.1*  --    Total Protein 4.7* 4.9*  --    Bilirubin,Total 2.1* 2.0*  --    Alk Phos 183* 195*  --    ALT 35 24  --    AST 201* 220*  --  Glucose 81  --   --      Recent Labs  Lab 03/25/13  2312 03/25/13  2311 03/25/13  1041 03/23/13  0708   WBC  --  9.1 9.4 7.8   Hemoglobin 10.8* 10.3* 11.1* 6.5*   Hematocrit  --  30* 34 20*   Platelets  --  92* 111* 128*     ECG: NSR, LAD, prolonged PR interval, stable wide  QRS in lateral leads.     Radiology:    Abd U/S Pending read:   CXR: No effusions, diaphragms clear, poor inspiration.      Assessment: 35F with multiple vascular disease including LLE amputation, MI, CVA, HTN, now with failure to thrive since her last hospital admission.  Today, no clear findings to indicate cause of decline.  Her family members that were reached were in agreement that she was declining slowly.  Overall, a goals of care discussion is warranted and family had planned to meet at the nursing home to discuss her.  Given she is asymptommatic, even if we did find something like gallstones, the risk of surgery for her without acute illness or symptoms would far outweigh any benefit.  We would continue to agressively treat constipation and her slow bowel.     Transaminitis:  ASX, abd [place] pending, LFTs elevated, recent blood transfusions, possible dehydration lately, unclear reason but at this tim,e no active treatment is warranted.     DEHYDRATION: Creatinine at baseline, appears euvolemic on exam.  Ok to d/c IVF.    CHF: Hx of EF 30%, euvolemic currently.  Stable heart rate with EKG at baseline with 1st degree AV block.     -Continue home medications.     Parkinsonism: Continue home medications of sinemet.     CONSTIPATION: Continue aggressive bowel management.     Failure to thrive:  Encourage careful hand feeding.  Antiemetics PRN.     CODE STATUS: Unable to reach son, HCP.  Currently Full code, trial intubation.     At this time, will discharge her back to the [facility].     Author: Hilaria Ota, MD Note created:  9:14 AM  03/26/2013    Geriatrics attending  Pt seen, examined and discussed with Dr. Eloise Levels and with pt's sister.    Pt awake,intermittently able to answer questions, with some dysarthria.  Positioned leaning to L.  HEENT - able to protrude tongue  Cor - RRR with S1S2 no S3S4 or murmur  Lungs - clear ant  Abd - mild diffuse TTP, +BS, soft, ND  Ext - s/p L AKA - good healing of stump.  +  edema in other extremities.    A/P  68 yo woman with multiple medical problems, who presents with subacute decline following prolonged hospitalizations in Oct/Nov for PVD/gangrene with amputation, and Nov/Dec for SB

## 2013-03-26 NOTE — Progress Notes (Signed)
Report called to the [facility], transportation to be set up for return this afternoon.

## 2013-03-26 NOTE — ED Notes (Signed)
Provider made aware of hemolized INR

## 2013-03-27 LAB — AEROBIC CULTURE

## 2013-03-31 LAB — BLOOD CULTURE: Bacterial Blood Culture: 0

## 2013-04-06 LAB — EKG 12-LEAD
P: 23 degrees
QRS: -80 degrees
Rate: 66 {beats}/min
Severity: ABNORMAL
Severity: ABNORMAL
Statement: ABNORMAL
T: 165 degrees

## 2013-04-12 DEATH — deceased

## 2014-06-24 IMAGING — CR DG CHEST 2V
2 series · 2 of 2 positions shown · non-contrast
Comparison: 06/05/2011

CLINICAL DATA: Shortness of breath, altered mental status, diabetes

CHEST - 2 VIEW

[w chest pa]
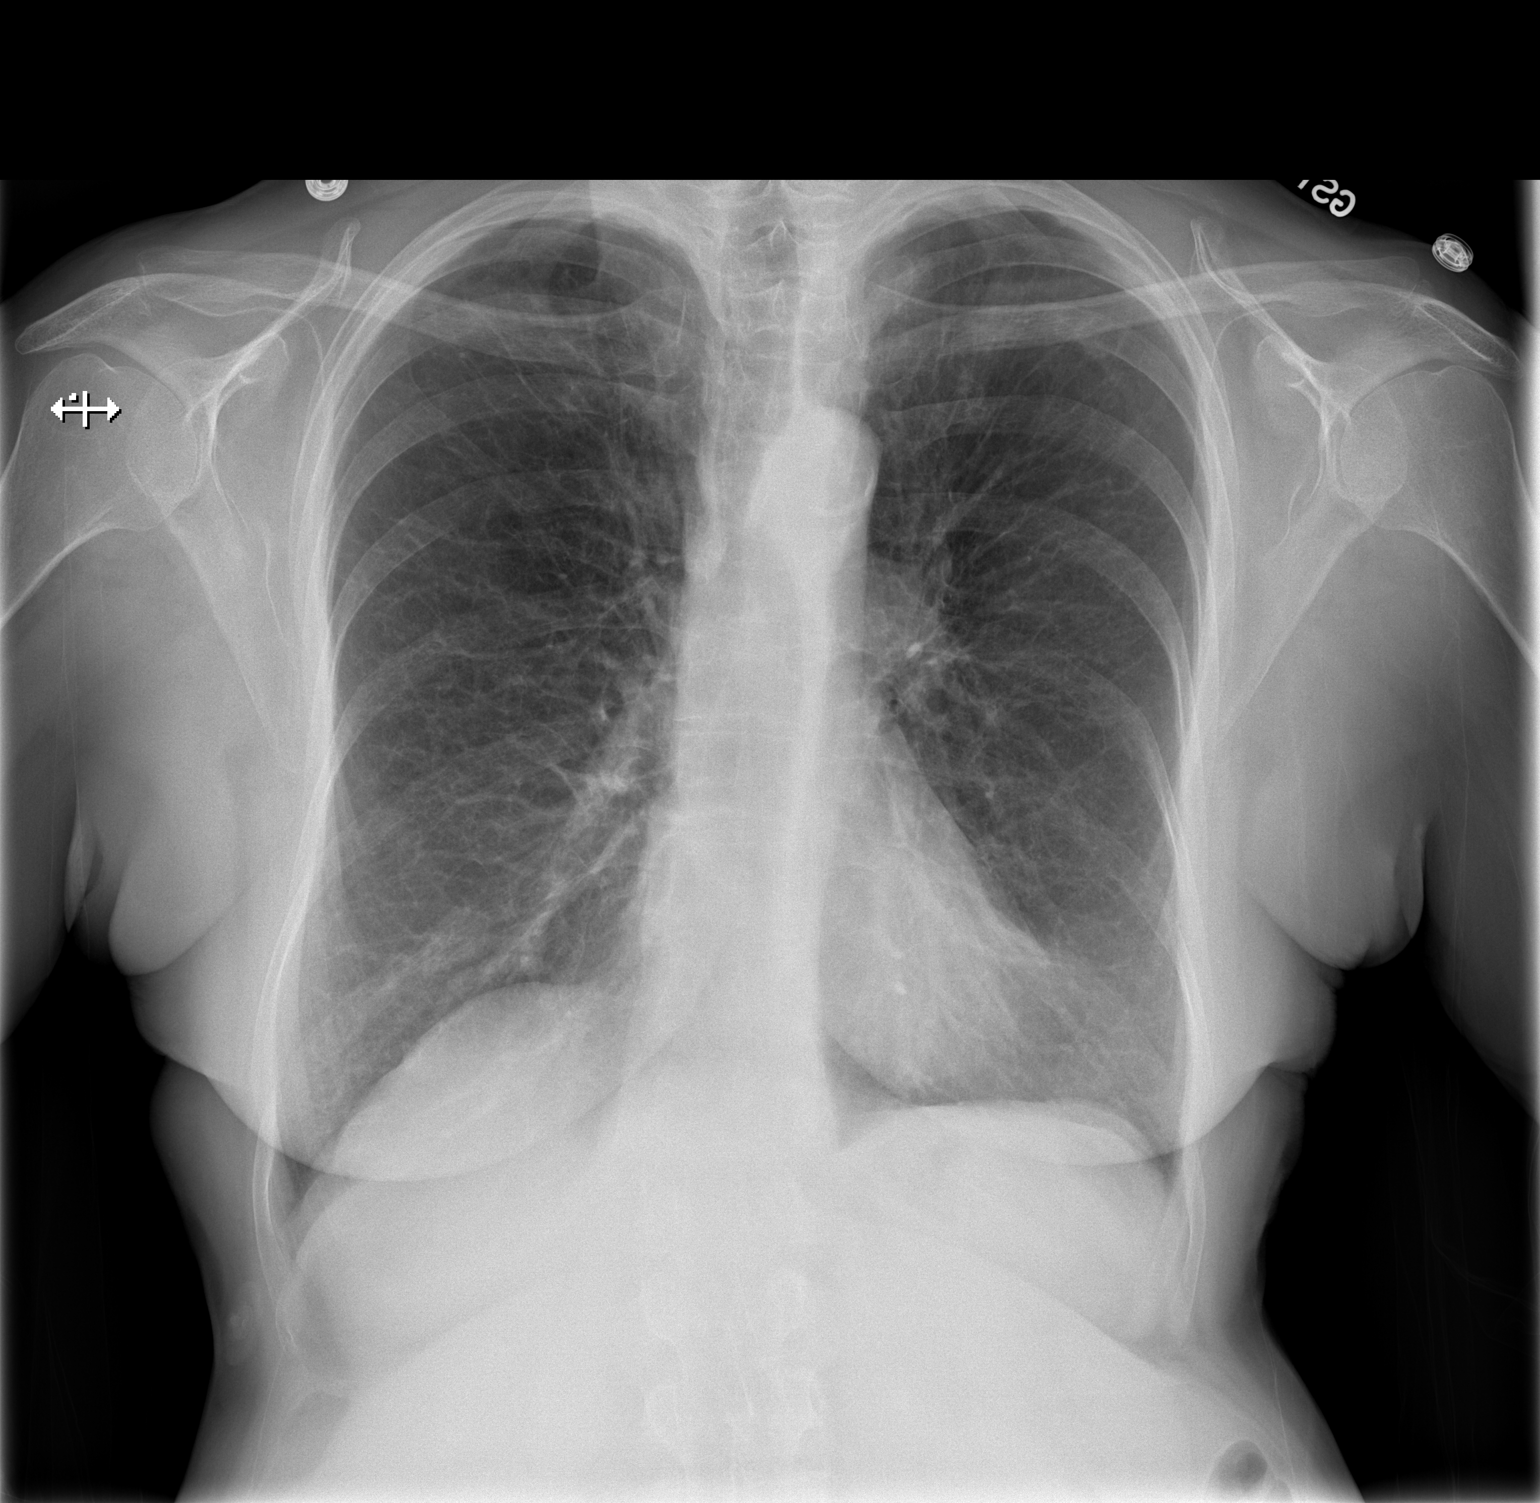

[w chest lat]
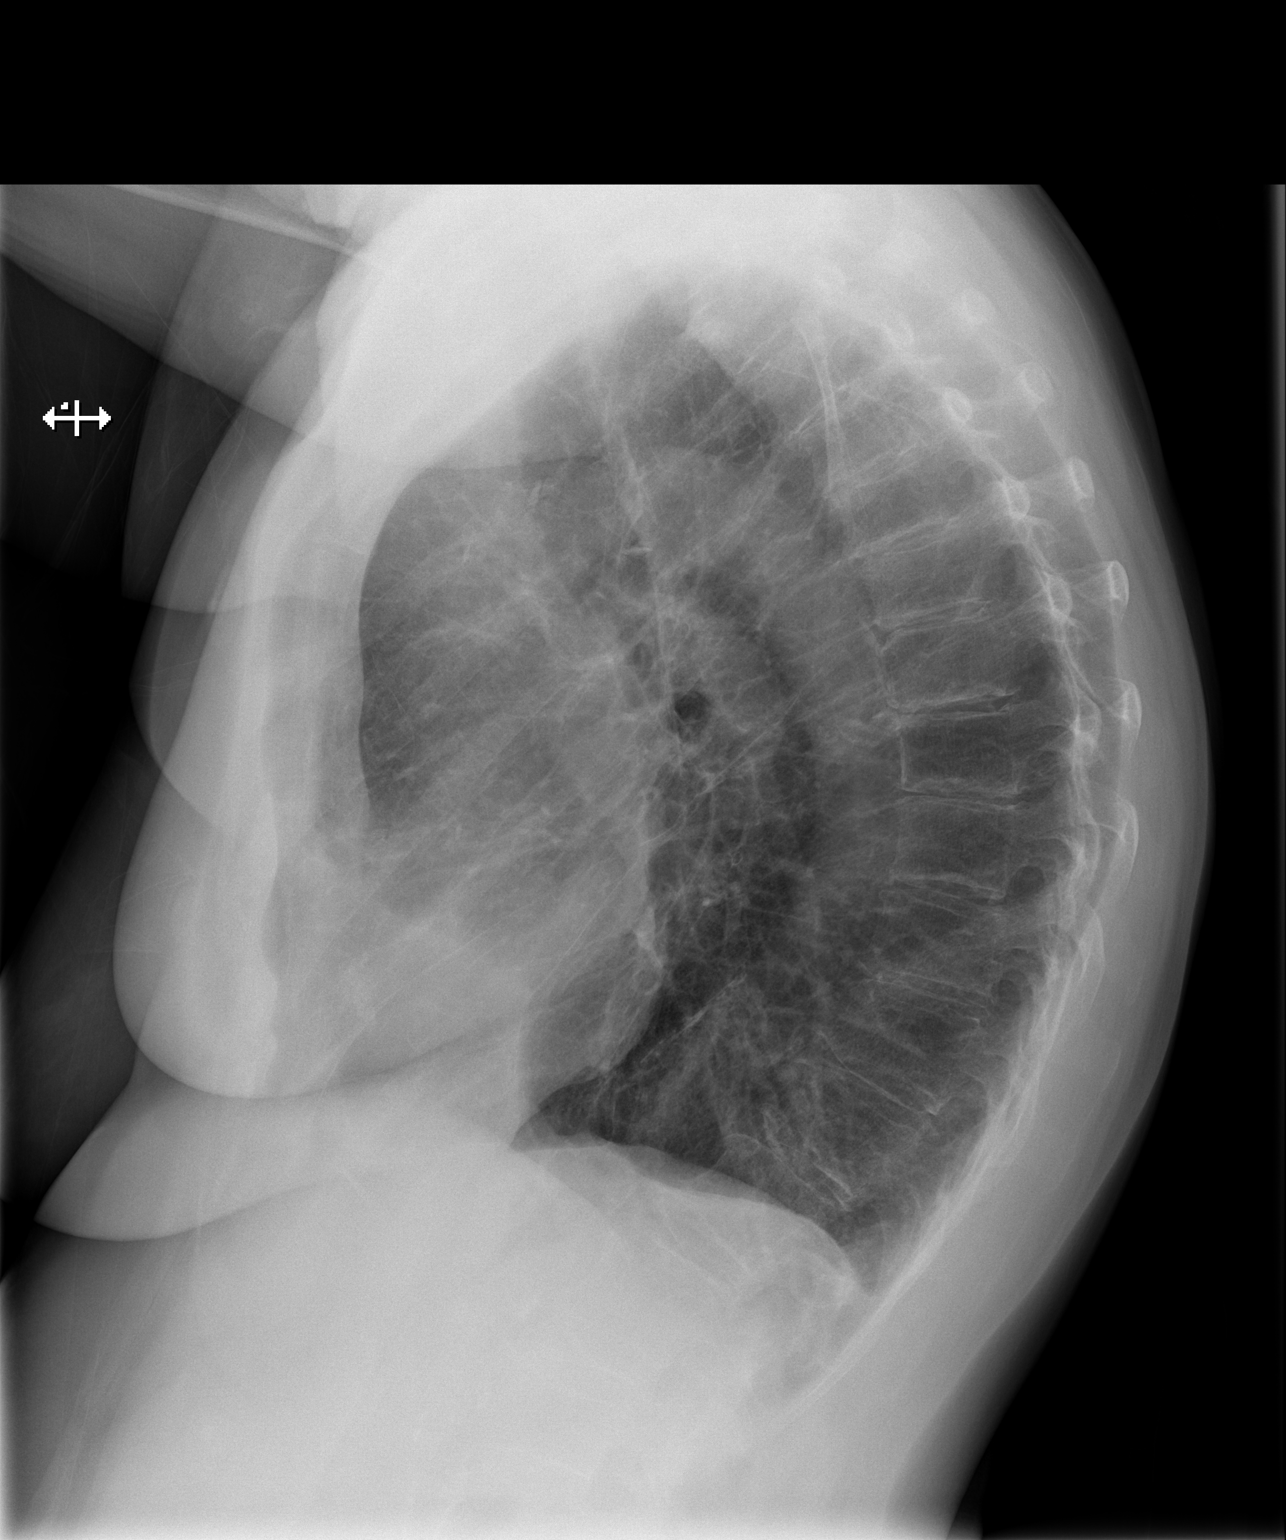

[2 of 2 positions shown; findings below may reference images not displayed]

FINDINGS: Normal heart size, mediastinal contours, and pulmonary vascularity.
Atherosclerotic calcification aorta.
Lungs emphysematous but clear.
No pleural effusion or pneumothorax.
Bones demineralized.
IMPRESSION: COPD changes.
No acute abnormalities.

## 2014-06-25 IMAGING — CT CT HEAD W/O CM
1 of 2 series · 13 of 30 positions shown, 17 images · non-contrast
Comparison: 04/30/2010

CLINICAL DATA: Altered mental status

CT HEAD WITHOUT CONTRAST
TECHNIQUE: Contiguous axial images were obtained from the base of
the skull through the vertex without contrast.

[Series 2: brain · axial · 0.47mm/px · z∈[+111,+234]mm · 13 of 28 slices shown, 17 images]
[im 2/28  brain]
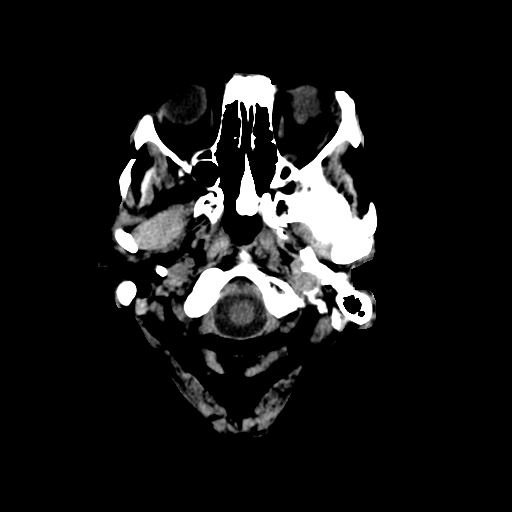
[im 2/28  bone]
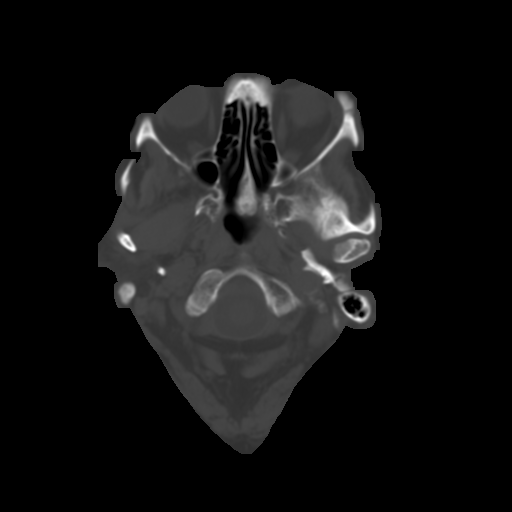
[im 4/28  brain]
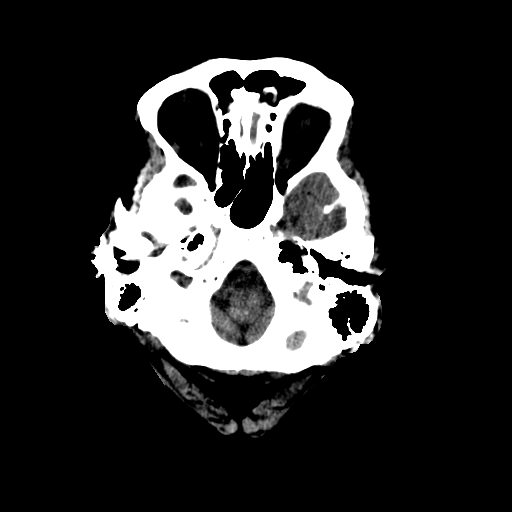
[im 6/28  brain]
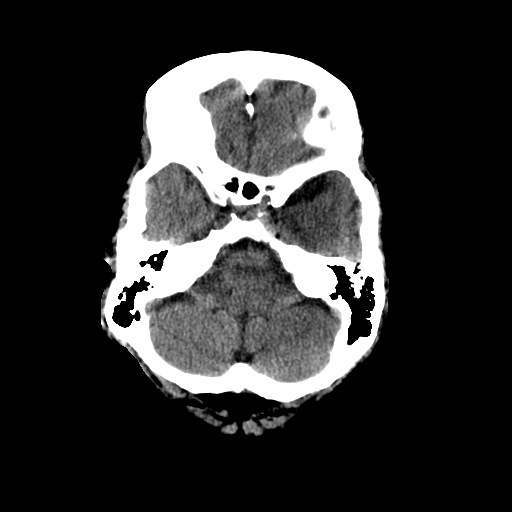
[im 8/28  brain]
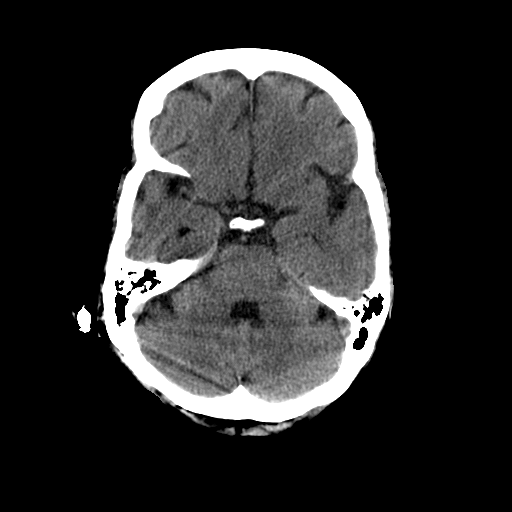
[im 10/28  brain]
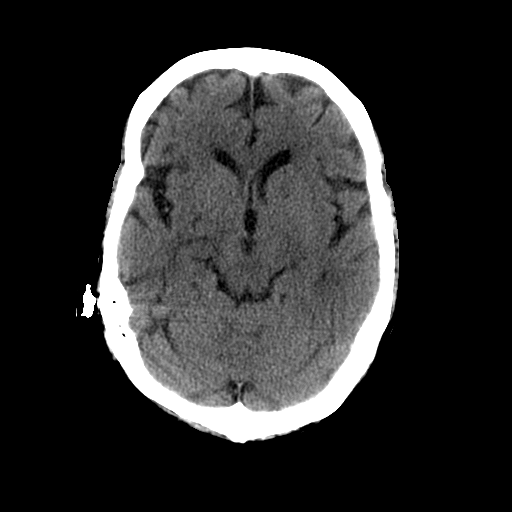
[im 10/28  bone]
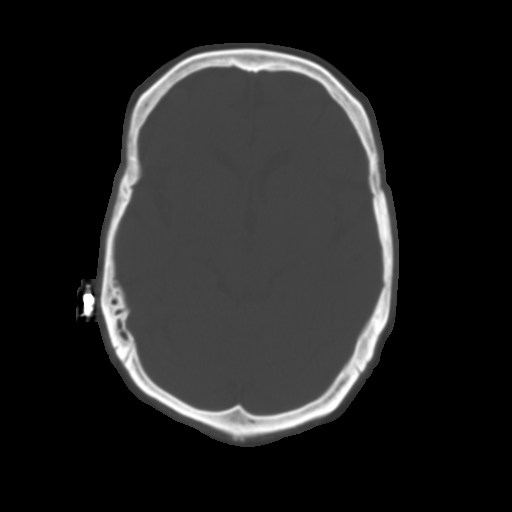
[im 12/28  brain]
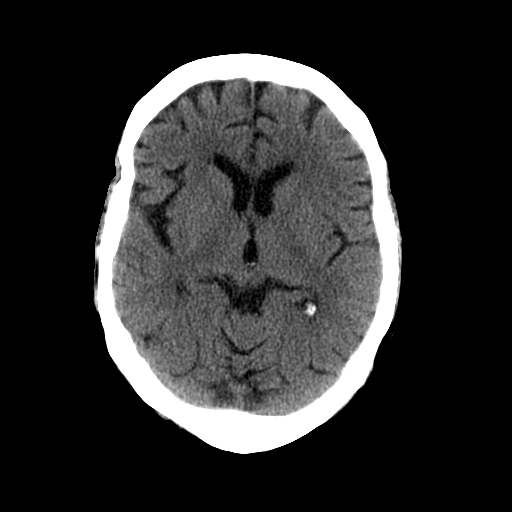
[im 14/28  brain]
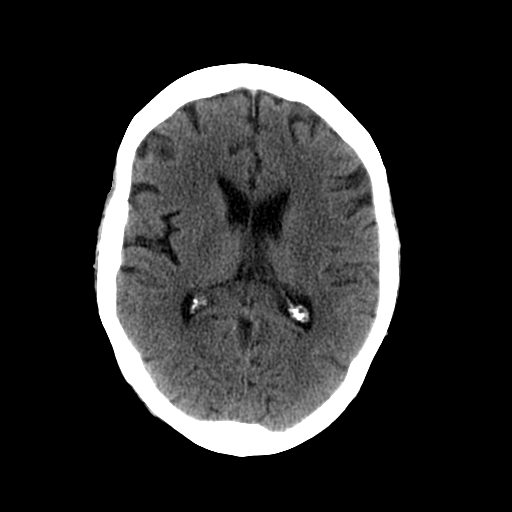
[im 16/28  brain]
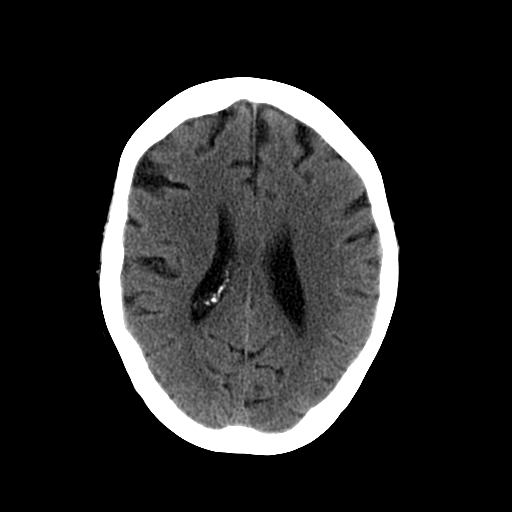
[im 18/28  brain]
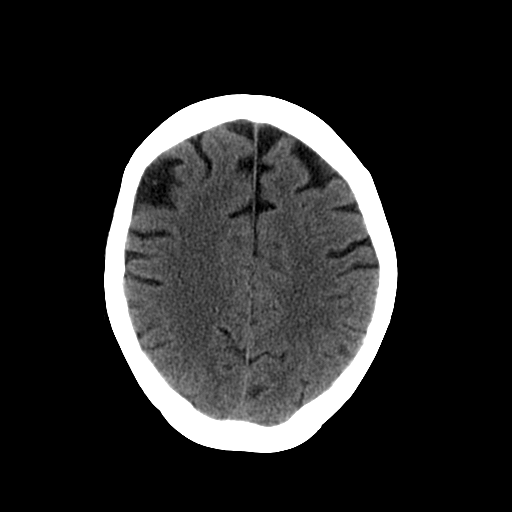
[im 18/28  bone]
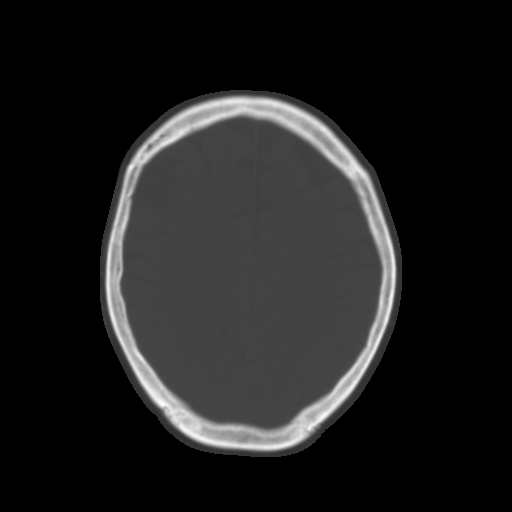
[im 20/28  brain]
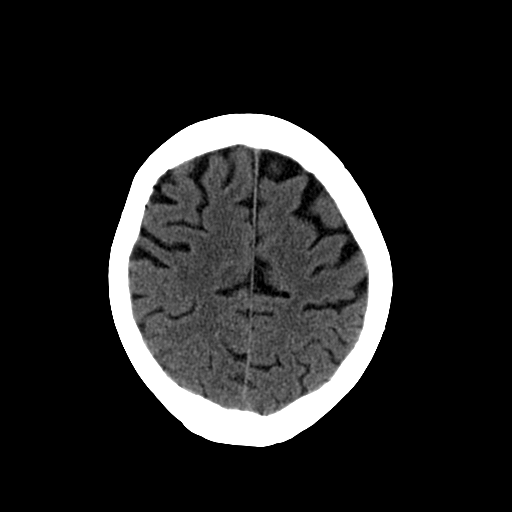
[im 22/28  brain]
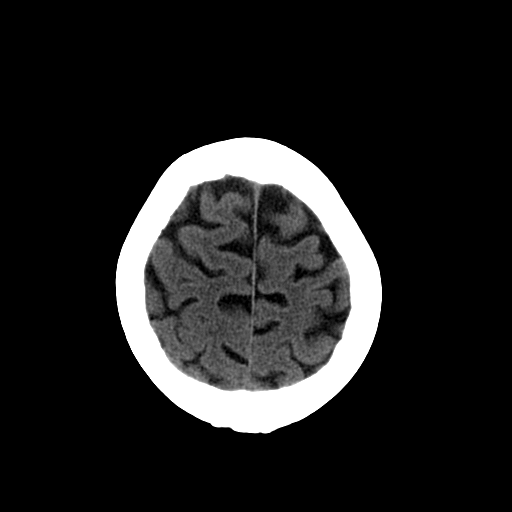
[im 24/28  brain]
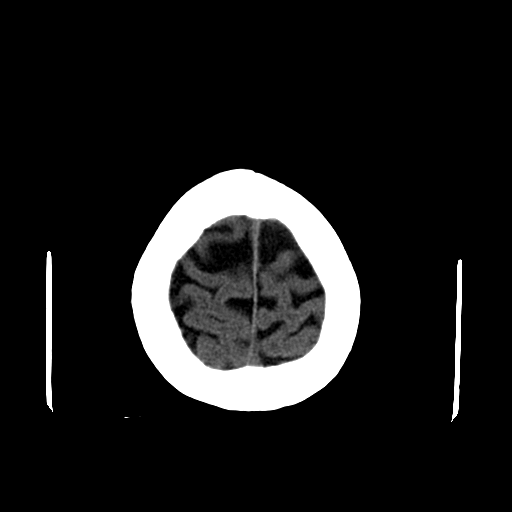
[im 26/28  brain]
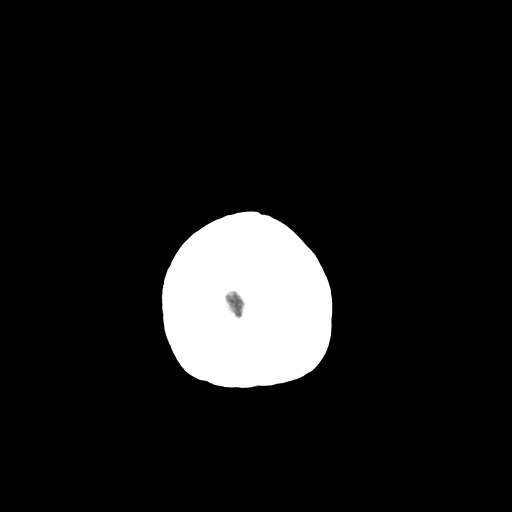
[im 26/28  bone]
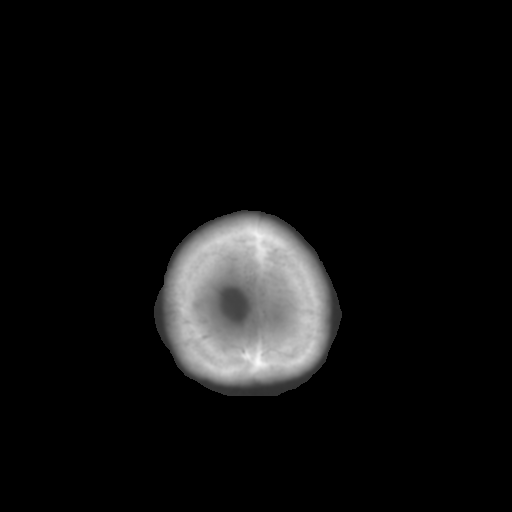

[13 of 30 positions shown; findings below may reference images not displayed]

FINDINGS: Mlid prominence of the sulci, cisterns, and ventricles,
in keeping with volume loss. There are mild subcortical and
periventricular white matter hypodensities, a nonspecific finding
most often seen with chronic microangiopathic changes.

There is no evidence for acute hemorrhage, overt hydrocephalus,
mass lesion, or abnormal extra-axial fluid collection.  No definite
CT evidence for acute cortical based (large artery) infarction.
The visualized paranasal sinuses and mastoid air cells are
predominately clear.
IMPRESSION: Mild volume loss and white matter changes.  No CT evidence of acute
intracranial abnormality.

## 5711-07-14 DEATH — deceased
